# Patient Record
Sex: Male | Born: 1945
Health system: Southern US, Community
[De-identification: ages and names within clinical notes are randomized; demographics above are authoritative.]

## PROBLEM LIST (undated history)

## (undated) DIAGNOSIS — E119 Type 2 diabetes mellitus without complications: Secondary | ICD-10-CM

## (undated) DIAGNOSIS — F419 Anxiety disorder, unspecified: Secondary | ICD-10-CM

## (undated) DIAGNOSIS — E785 Hyperlipidemia, unspecified: Secondary | ICD-10-CM

## (undated) DIAGNOSIS — I10 Essential (primary) hypertension: Secondary | ICD-10-CM

## (undated) DIAGNOSIS — G4730 Sleep apnea, unspecified: Secondary | ICD-10-CM

## (undated) DIAGNOSIS — G4700 Insomnia, unspecified: Secondary | ICD-10-CM

## (undated) DIAGNOSIS — I2510 Atherosclerotic heart disease of native coronary artery without angina pectoris: Secondary | ICD-10-CM

## (undated) DIAGNOSIS — K219 Gastro-esophageal reflux disease without esophagitis: Secondary | ICD-10-CM

## (undated) HISTORY — PX: CARDIAC SURGERY: SHX584

## (undated) HISTORY — DX: Insomnia, unspecified: G47.00

## (undated) HISTORY — PX: CORONARY ARTERY BYPASS GRAFT: SHX141

## (undated) HISTORY — DX: Gastro-esophageal reflux disease without esophagitis: K21.9

## (undated) HISTORY — DX: Sleep apnea, unspecified: G47.30

## (undated) HISTORY — DX: Type 2 diabetes mellitus without complications: E11.9

## (undated) HISTORY — DX: Anxiety disorder, unspecified: F41.9

---

## 2001-06-14 ENCOUNTER — Encounter: Payer: Self-pay | Admitting: Emergency Medicine

## 2001-06-14 ENCOUNTER — Inpatient Hospital Stay (HOSPITAL_COMMUNITY): Admission: EM | Admit: 2001-06-14 | Discharge: 2001-06-17 | Payer: Self-pay | Admitting: Emergency Medicine

## 2001-07-12 ENCOUNTER — Encounter (HOSPITAL_COMMUNITY): Admission: RE | Admit: 2001-07-12 | Discharge: 2001-10-10 | Payer: Self-pay | Admitting: Cardiology

## 2001-10-11 ENCOUNTER — Encounter (HOSPITAL_COMMUNITY): Admission: RE | Admit: 2001-10-11 | Discharge: 2002-01-09 | Payer: Self-pay | Admitting: Cardiology

## 2007-04-09 ENCOUNTER — Inpatient Hospital Stay (HOSPITAL_COMMUNITY): Admission: EM | Admit: 2007-04-09 | Discharge: 2007-04-16 | Payer: Self-pay | Admitting: Emergency Medicine

## 2007-04-09 ENCOUNTER — Ambulatory Visit: Payer: Self-pay | Admitting: *Deleted

## 2007-04-10 ENCOUNTER — Ambulatory Visit: Payer: Self-pay | Admitting: Cardiothoracic Surgery

## 2007-05-09 ENCOUNTER — Ambulatory Visit: Payer: Self-pay | Admitting: Cardiothoracic Surgery

## 2007-05-09 ENCOUNTER — Encounter: Admission: RE | Admit: 2007-05-09 | Discharge: 2007-05-09 | Payer: Self-pay | Admitting: Cardiothoracic Surgery

## 2007-07-15 ENCOUNTER — Ambulatory Visit: Payer: Self-pay | Admitting: Cardiothoracic Surgery

## 2008-08-04 IMAGING — CR DG CHEST 1V PORT
1 series · 1 of 1 positions shown · non-contrast
Comparison: none

CLINICAL DATA: Status post CABG procedure.
 PORTABLE CHEST - 1 VIEW - 04/12/07:

[view not recorded]
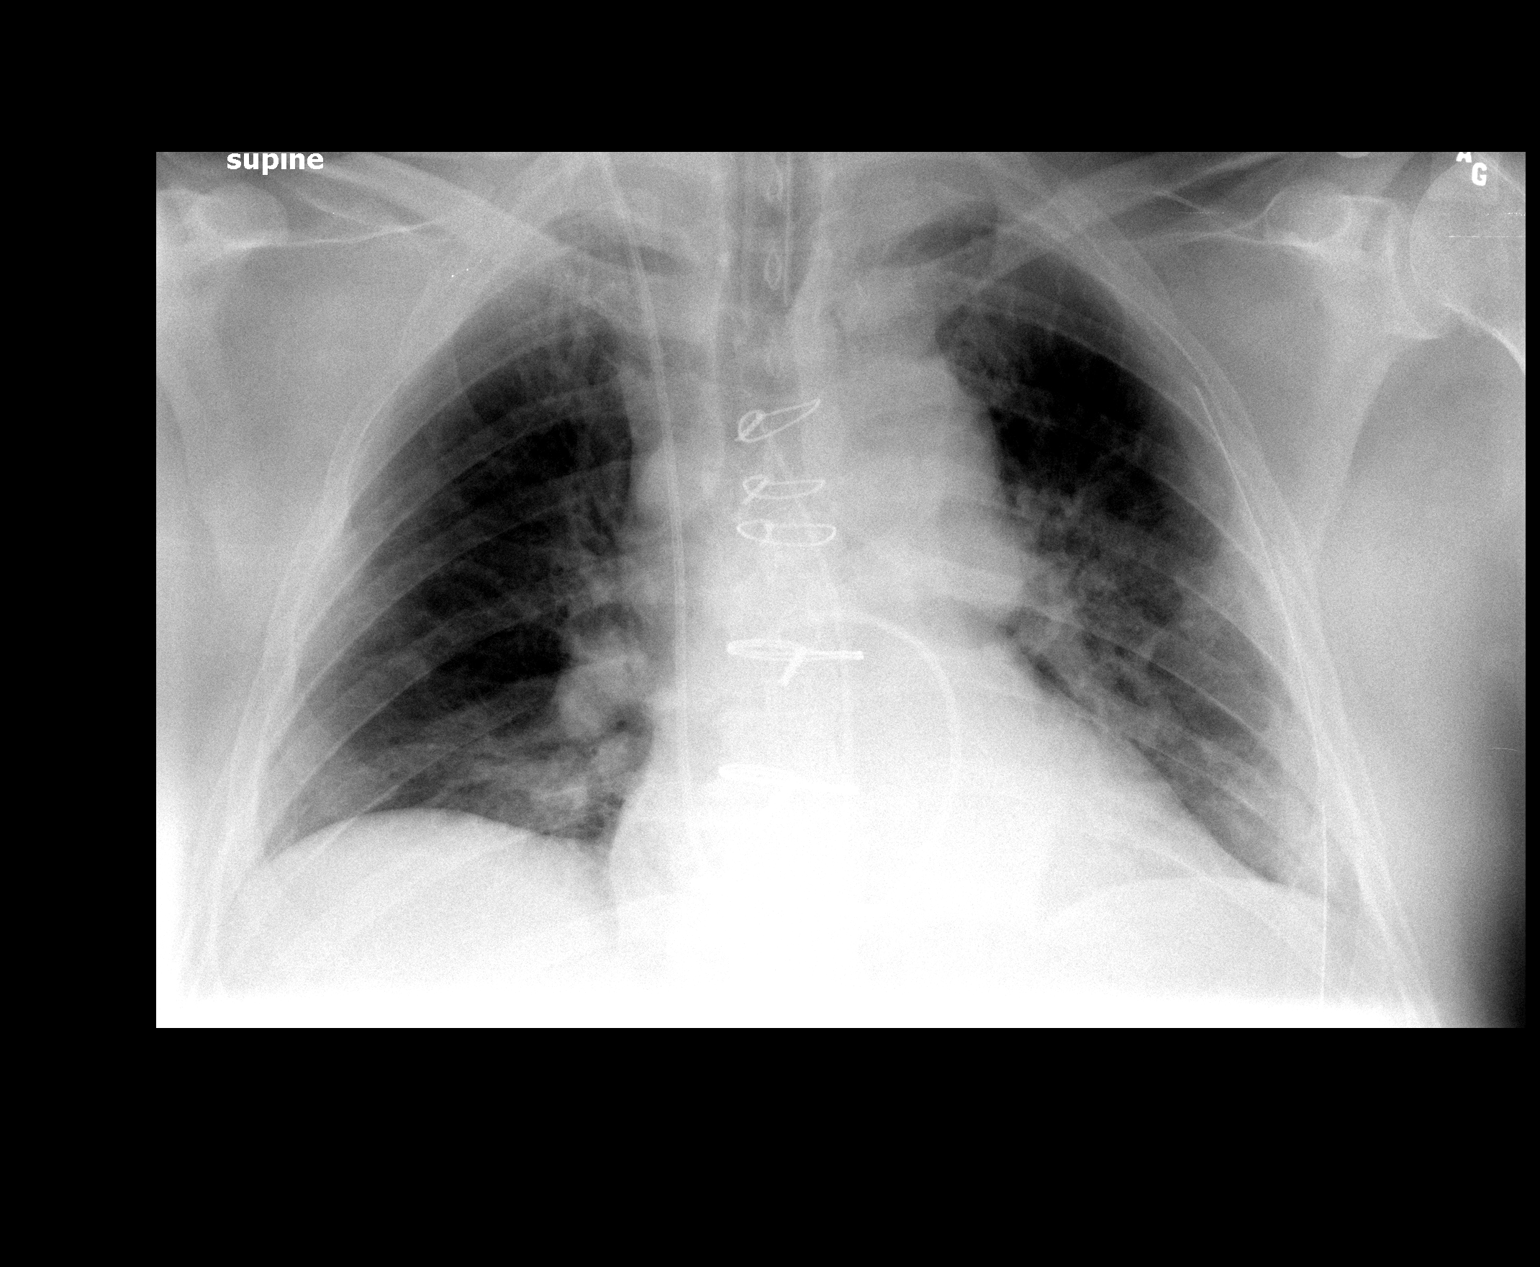

[1 of 1 positions shown; findings below may reference images not displayed]

FINDINGS: An endotracheal tube is 5.8 cm above the carina.  The pulmonary artery catheter is in the main right pulmonary artery. The patient has a left chest tube as well as a mediastinal drain.  There are low lung volumes without a large pneumothorax.  The patient is status median sternotomy.  Fullness in the mediastinum is consistent with recent surgery.
IMPRESSION: 1.  Post-op changes without pneumothorax. 
 2.  Support apparatus as described.

## 2008-08-05 IMAGING — CR DG CHEST 1V PORT
1 series · 1 of 1 positions shown · non-contrast
Comparison: Chest, single view, 04/12/07.

CLINICAL DATA: CABG.  
 PORTABLE CHEST - 1 VIEW ? 04/13/07:

[view not recorded]
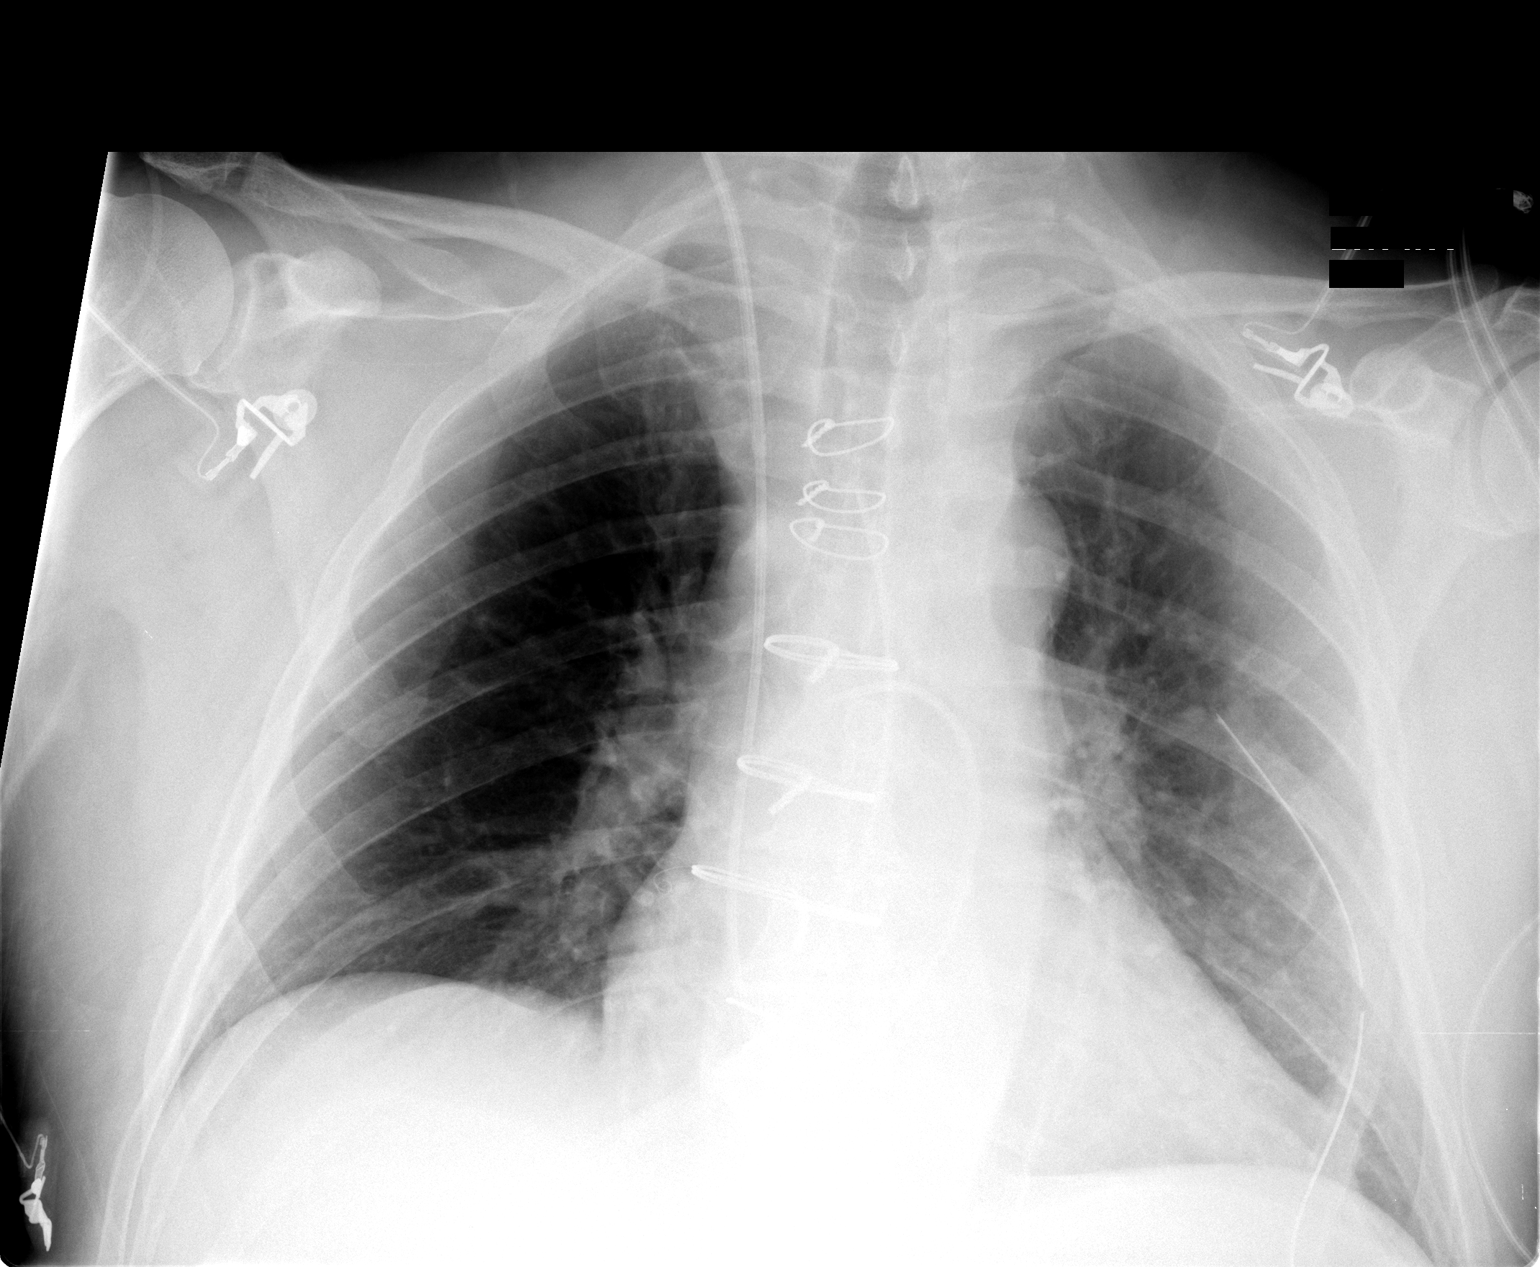

[1 of 1 positions shown; findings below may reference images not displayed]

FINDINGS: Interval removal of endotracheal tube with no significant atelectasis.  Left chest tube remains.  No evidence of pneumothorax.  A Swan-Ganz catheter with tip in the main right pulmonary artery.   There is interval improvement in central venous congestion compared to prior.
IMPRESSION: 1.  Interval extubation without significant atelectasis. 
 2.  Interval improvement in central venous pulmonary congestion.

## 2008-11-08 ENCOUNTER — Emergency Department (HOSPITAL_COMMUNITY): Admission: EM | Admit: 2008-11-08 | Discharge: 2008-11-08 | Payer: Self-pay | Admitting: Emergency Medicine

## 2010-10-18 LAB — WOUND CULTURE

## 2010-10-18 LAB — GLUCOSE, CAPILLARY: Glucose-Capillary: 93 mg/dL (ref 70–99)

## 2010-11-22 NOTE — Assessment & Plan Note (Signed)
OFFICE VISIT   Robert Barrett, Robert Barrett  DOB:  Aug 29, 1945                                        May 09, 2007  CHART #:  [id number]   REFERRING PHYSICIAN:  Georga Hacking, M.D.   BRIEF HISTORY:  The patient is a 65 year old male who underwent coronary  artery bypass grafting times two on April 12, 2007.  He is making  excellent  progress postoperatively.  He has had no recurrent angina.  He is increasing his physical activity appropriately.  He has decided  that he does not wish to go to the cardiac rehab program as he has done  this before and he notes that it slowed him down.   PHYSICAL EXAMINATION:  Vital Signs:  On exam his blood pressure is  138/85, pulse is 83, respiratory rate is 12 and O2 sat is 98%.  Chest:  His sternum is stable and well-healed.  Lungs:  His lungs are clear  bilaterally.  Extremities:  The right thigh endovein harvest site has  healed without difficulty.   DISCUSSION:  Since being seen by Dr. Donnie Aho he has been restarted on his  lisinopril, which was being held because of low blood pressure early  postoperatively.  He continues on simvastatin, Lopressor and aspirin.  Follow up chest x-ray showed clear lung fields bilaterally.   Overall I am very pleased with his progress.  Because his work does  involve heavy lifting and physical activities with his arms, working in  a body shop with a frame machine, he will wait to return to work until  early January.   Sheliah Plane, MD  Electronically Signed   EG/MEDQ  D:  05/09/2007  T:  05/10/2007  Job:  [id number]   cc:   Georga Hacking, M.D.

## 2010-11-22 NOTE — Discharge Summary (Signed)
Robert Barrett, Robert Barrett NO.:  [id number]   MEDICAL RECORD NO.:  [id number]          PATIENT TYPE:  INP   LOCATION:  2027                         FACILITY:  [facility]   PHYSICIAN:  Sheliah Plane, MD    DATE OF BIRTH:  11/21/1945   DATE OF ADMISSION:  04/08/2007  DATE OF DISCHARGE:  04/15/2007                               DISCHARGE SUMMARY   FINAL DIAGNOSIS:  Coronary occlusive disease, unstable angina.   IN-HOSPITAL DIAGNOSIS:  Acute blood loss anemia postoperatively.   SECONDARY DIAGNOSES:  1. Hypertension.  2. Hyperlipidemia.  3. Borderline diabetes, diet controlled.  4. Status post cervical disk surgery 20 years ago without fusion.   IN-HOSPITAL OPERATIONS AND PROCEDURES:  1. Cardiac catheterization.  2. Coronary bypass grafting x2 with left internal mammary artery to      left anterior descending coronary artery, reverse saphenous vein      graft to circumflex coronary artery with right thigh endovein      harvesting.   HISTORY AND PHYSICAL AND HOSPITAL COURSE:  The patient is a 65 year old  male with known coronary occlusive disease having had angioplasty in the  right coronary artery at that time with an acute inferior myocardial  infarction 2002.  The patient has done well until recently when he began  to having rapidly increasing anginal symptoms.  The patient underwent  cardiac catheterization by Dr. Donnie Aho which showed complex high-grade  stenosis of proximal LAD and proximal circumflex.  The right coronary  artery had a patent stent with some luminal irregularities but no high-  grade stenosis.  Because of the anatomic complexity of the lesions,  coronary artery bypass grafting was recommended.  The patient was seen  and evaluated by Dr. Tyrone Sage.  Dr. Tyrone Sage discussed with the patient  undergoing coronary bypass grafting.  He discussed risks and benefits.  The patient acknowledged understanding and agreed to proceed.  Surgery  was scheduled for  April 12, 2007.  Preoperatively, the patient did have  bilateral carotid duplex ultrasound showing no significant ICA stenosis.  The patient remained stable preoperatively.   The patient was taken to the operating room April 12, 2007, where he  underwent coronary bypass grafting x2 with left internal mammary artery  to left anterior descending coronary artery, saphenous vein graft to  circumflex coronary artery with right thigh endovein harvesting.  The  patient tolerated this procedure well and was transferred to the  intensive care unit in stable condition.  Postoperatively, the patient  was noted to be hemodynamically stable.  He was extubated the evening of  surgery.  Post extubation the patient noted to be alert and oriented x4.  Neurologically intact.  The patient's postoperative course was pretty  much unremarkable.  Following the intensive care unit, vital signs were  stable.  He was able to be weaned from all drips.  __________ Normal  fashion.  Postoperative x-ray was stable with minimal drainage from  chest tubes.  Chest tube was discontinued in normal fashion.  The  patient had slight acute blood loss anemia with hematocrit 29%.  He was  asymptomatic and this  was followed.  It did improve by postop day 2 to  33%.  The patient was out of bed ambulating well with cardiac rehab in  the intensive care unit.  He remained in normal sinus rhythm.  The  patient was transferred to 2000 postop day #2.  While on telemetry  floor, the patient remained stable  Vital signs were followed, and the  patient is afebrile prior to discharge.  He was able to be weaned off  oxygen sating greater than 90% on room air.  The patient remained in  normal sinus rhythm.  Pulmonary status remained stable.  Incisions were  clean, dry, intact and healing well.  He continued to ambulate well with  rehab.  Tolerating diet well.  No nausea, vomiting noted.  Postop day #3  showed a white count of 11.6,  hemoglobin of 11.6, hematocrit 33.7,  platelet count 227.  Postop day #2, BMP showed sodium of 137, potassium  4.0, chloride 105, bicarb of 26, BUN of 6, creatinine 0.89, glucose 119.   The patient is tentatively ready for discharge home with the next 24  hours.   FOLLOW-UP APPOINTMENTS:  Follow-up appointment has been arranged with  Dr. Tyrone Sage for May 09, 2007 at 12:30 p.m.  The patient will need  to obtain PMI chest x-ray 30 minutes prior to this appointment.  The  patient will need to follow up with Dr. Donnie Aho in 2 weeks.  He needs to  contact Dr. York Spaniel office to make these arrangements.   DISCHARGE INSTRUCTIONS:  1. Activity: Patient instructed no driving until released to do so.      No lifting over 10 pounds.  He was told to ambulate 3-4 times per      day.  Progress as tolerated.  Continue his breathing exercises.  2. Incisional care.  The patient was told to shower, washing his      incisions using soap and water.  He is to contact the office if he      develops any drainage or opening from any of his incision sites.  3. Diet.  The patient educated on diet to be low-fat, low-salt.   DISCHARGE MEDICATIONS:  1. Aspirin 325 mg daily.  2. Lopressor 25 mg t.i.d.  3. Simvastatin 80 mg at night.  4. Omeprazole 20 mg b.i.d..  5. Oxycodone 5 mg 1-2 tablets q. 4-6 hours p.r.n. pain.      Theda Belfast, [place]      Sheliah Plane, MD  Electronically Signed    KMD/MEDQ  D:  04/15/2007  T:  04/15/2007  Job:  [id number]   cc:   Sheliah Plane, MD  Georga Hacking, M.D.

## 2010-11-22 NOTE — H&P (Signed)
Robert Barrett, Robert Barrett NO.:  [id number]   MEDICAL RECORD NO.:  [id number]          PATIENT TYPE:  EMS   LOCATION:  MAJO                         FACILITY:  [facility]   PHYSICIAN:  Unice Cobble, MD     DATE OF BIRTH:  07/07/1946   DATE OF ADMISSION:  04/09/2007  DATE OF DISCHARGE:                              HISTORY & PHYSICAL   CARDIOLOGIST:  Dr. Karleen [facility]   CHIEF COMPLAINT:  Chest pain.   HISTORY OF PRESENT ILLNESS:  This is a 65 year old white male with a  history of STEMI in 2002, hypertension, hyperlipidemia, presents with  chest pain.  Patient had shortness of breath earlier today around lunch  time which he could not relieve with deep breathing.  He laid down later  in the day and proceeded to have tightness around his upper abdomen  associated with diaphoresis and further shortness of breath.  He did not  have frank substernal chest pain like his inferior STEMI in 2002.  The  pain resolved on its own once he arrived in the emergency room.  No  symptoms of CHF.  No presyncope or palpitations.  The patient admits to  having a very stressful day.   PRIOR MEDICAL HISTORY:  1. STEMI; inferior infarct 2002 treated with primary stenting.  At the      time, he was also shown to have moderate left circumflex and left      anterior descending disease.  Preserved LV function.  2. Hypertension.  3. Hyperlipidemia.  4. Remote smoking history.  5. GERD.  6. Back surgery.   ALLERGIES:  No known drug allergies.   MEDICATIONS:  1. Zocor 20 mg daily.  2. Metoprolol b.i.d., unknown dose.  3. Omeprazole 40 mg daily.  4. Etodolac.  5. Aspirin 81 mg daily.  6. Multivitamin.   SOCIAL HISTORY:  Lives in [place] with his wife.  He does Designer, television/film set  for [employer].  He quit smoking in the mid-1990's.  Social drinker.  No drugs.   FAMILY HISTORY:  His mother died at the age of 74 with cancer.  His  father had a coronary artery bypass grafting  at the age of 38.   REVIEW OF SYSTEMS:  The three review of systems was done and found to be  otherwise negative except as stated in the HPI.   PHYSICAL EXAMINATION:  VITAL SIGNS:  Pulse of 79 with a blood pressure  of 148/104, respiratory rate is 17, he is afebrile, he is 95% on two  liters.  (In the field, his blood pressure was 170/110).  GENERAL:  He is in no acute distress and overweight.  HEENT:  Shows PERRLA, EOMI, MMM.  NECK:  Supple without lymphadenopathy, thyromegaly, bruits or jugular  venous distention.  CARDIOVASCULAR:  His heart has a regular rate rhythm with a normal S1  and S2.  He has a normal PMI.  I can hear no murmurs, gallops or rubs.  Pulses are 2+ and equal bilaterally without bruits.  LUNGS:  Clear to auscultation bilaterally without wheezes, rhonchi or  rales.  ABDOMEN:  Soft and nontender without rebound or guarding.  Normal bowel  sounds.  EXTREMITIES:  No cyanosis, clubbing or edema.  NEUROLOGICALLY:  He is alert and oriented x3.  Cranial nerves II-XII  grossly intact.  Strength is 5/5 all extremities and grip, shows normal  sensation throughout.   Chest x-ray shows no acute cardiopulmonary disease.   CT of the chest is pending at this time.   EKG shows a rate of 79 with normal sinus rhythm without ST or T wave  changes.   Labs show normal CBC and basic metabolic panel with a creatinine of 1.0.  CK-MB and troponin are within the normal limits.   ASSESSMENT/PLAN:  This is a 65 year old white male with a history of  STEMI in 2002, presents with atypical angina.  The patient's symptoms  are concerning in that he has known coronary artery disease and several  symptoms consistent with angina.  He tells me he had a negative stress  test eight months ago which makes the likelihood of disease less likely,  however I will rule him out overnight and plan on either a repeat stress  test in the morning or proceeding with cardiac catheterization per  attending  cardiologist discretion.  I will otherwise continue his home  medications and ensure mobility for deep vein thrombosis prophylaxis.  His wife will bring in his exact mediation dosages tomorrow.  CT read of  the chest will be followed up on shortly.      Unice Cobble, MD  Electronically Signed     ACJ/MEDQ  D:  04/09/2007  T:  04/09/2007  Job:  [id number]

## 2010-11-22 NOTE — Op Note (Signed)
NAMEMAXIMUM, REILAND NO.:  [id number]   MEDICAL RECORD NO.:  [id number]          PATIENT TYPE:  INP   LOCATION:  2313                         FACILITY:  [facility]   PHYSICIAN:  Sheliah Plane, MD    DATE OF BIRTH:  1945/08/17   DATE OF PROCEDURE:  04/12/2007  DATE OF DISCHARGE:                               OPERATIVE REPORT   PREOPERATIVE DIAGNOSIS:  Coronary occlusive disease with unstable  angina.   POSTOPERATIVE DIAGNOSIS:  Coronary occlusive disease with unstable  angina.   SURGICAL PROCEDURE:  Coronary artery bypass grafting times two with left  internal mammary to the left anterior descending coronary artery,  reverse saphenous vein graft to the circumflex coronary artery with  right thigh endovein harvesting.   SURGEON:  Dr. Tyrone Sage.   FIRST ASSISTANT:  Dr. Dorris Fetch.   SECOND ASSISTANT:  Lenise Herald [facility]   BRIEF HISTORY:  Patient a 65 year old male with known coronary occlusive  disease, having had an angioplasty on the right coronary artery at the  time of an acute inferior myocardial infarction in 2002.  Patient has  done well until recently when he began having rapidly increasing anginal  symptoms.  Underwent cardiac catheterization by Dr. Donnie Aho which showed  complex high grade stenosis of the proximal LAD and proximal circumflex.  The right coronary artery had a patent stent with some luminal  irregularities but no high grade stenosis.  Because of the complexity of  the anatomic complexity of the lesions, coronary artery bypass grafting  was recommended to the patient who agreed and signed informed consent.   DESCRIPTION OF PROCEDURE:  With Swan-Ganz and arterial line monitors in  place, the patient underwent general endotracheal anesthesia without  incident.  Skin of the chest and legs was prepped with Betadine and  draped in the usual sterile manner.  Using the [employer] endovein  harvesting system, a segment of an excellent quality vein was  harvested  from the right thigh.  A median sternotomy was performed.  Left internal  mammary artery was dissected down as a pedicle graft and distal artery  was divided, had good free flow.  Pericardium was opened, overall  ventricular function appeared preserved.  Patient was systemically  heparinized.  The ascending aorta and the right atrium were cannulated.  An aortic root bent cardioplegia needle was introduced into the  ascending aorta.  The patient was placed on cardiopulmonary bypass at  2.4L per minute per metered square.  Sites of anastomosis were selected  and dissected out of the epicardium.  The patient's body temperature was  cooled to 34 degrees.  Aortic cross clamp was applied, cold blood  potassium cardioplegia, 500 mL, was administered with diastolic arrest  of the heart, myocardial septal temperature was monitored throughout the  cross clamp.  The heart was elevated.  The circumflex coronary artery,  which was partially intramyocardial, was opened, was of good quality and  with using a running #7-0 Prolene a distal anastomosis was performed.  Additional cold blood cardioplegia was administered down the vein graft.  Attention was then turned to the left anterior descending coronary  artery which was opened in the midportion.  Using a running #8-0  Prolene, the left internal mammary artery was anastomosed to the left  anterior descending coronary artery.  With the crossclamp still in  place, a single punch aortotomy was performed.  The vein graft to the  circumflex was anastomosed to the ascending aorta.  Air was evacuated  from the graft.  Aortic cross clamp was removed.  The total cross clamp  was 41 minutes.  The patient spontaneously converted to a sinus rhythm.  Sites of anastomosis were inspected and free of bleeding.  He was then  ventilated and weaned from cardiopulmonary bypass without difficulty.  Total pump time 55 minutes.  Atrial and ventricular pacing wires  were  applied and a graft marker was applied.  The patient was decannulated in  the usual fashion.  Protamine sulfate was administered.  Pericardium was  loosely reapproximated.  A left pleural tube and a Blake mediastinal  tube were left in place.  Sternum was closed with a #6 stainless steel  wire.  The fascia closed with interrupted #0 Vicryl, running #3-0 Vicryl  in the subcutaneous tissue, #4-0 subcuticular stitch in the skin edges.  Dry dressings were applied.  A sponge and needle count was reported as  correct at the completion of the procedure.  The patient tolerated the  procedure without obvious complication, was transferred to the Surgical  Intensive Care Unit for further postoperative care.      Sheliah Plane, MD  Electronically Signed     EG/MEDQ  D:  04/14/2007  T:  04/14/2007  Job:  [id number]   cc:   Georga Hacking, M.D.

## 2010-11-22 NOTE — Cardiovascular Report (Signed)
NAMEROQUE, SCHILL NO.:  [id number]   MEDICAL RECORD NO.:  [id number]          PATIENT TYPE:  INP   LOCATION:  2039                         FACILITY:  [facility]   PHYSICIAN:  Georga Hacking, M.D.DATE OF BIRTH:  15-Sep-1945   DATE OF PROCEDURE:  04/09/2007  DATE OF DISCHARGE:                            CARDIAC CATHETERIZATION   HISTORY:  A 65 year old male with known coronary artery disease with a  previous inferior infarction that had been previously treated with  direct angioplasty and stenting in 2002.  He presented with worsening  dyspnea acutely that lasted about 30 minutes and then resolved.  He has  residual ST depression and initial cardiac enzymes were unremarkable.   PROCEDURE:  Left heart catheterization with coronary angiograms and left  ventriculogram.   COMMENTS ABOUT THE PROCEDURE:  The right femoral artery was entered  using a single anterior needle wall stick.  A 30 mL ventriculogram was  performed following visualization of the coronary arteries with the  standard right and left four 6-French catheters.  Sheath was removed in  the holding area.   HEMODYNAMIC DATA:  Aorta post contrast 135/88.  LV post contrast 135/6  to 12.   ANGIOGRAPHIC DATA:  Left ventriculogram:  Performed in the 30 degree RAO  projection.  The aortic valve was normal.  The mitral valve was normal.  Left ventricle appears normal in size.  Estimated ejection fraction was  65%.  Coronary arteries arise and distributed normally.  There was mild  coronary artery calcification noted.  Left main coronary artery is  normal.  Left anterior descending has a severe 90% stenosis just after  the first septal perforator.  There are then tandem stenoses of 60% to  70% following 2 areas of aneurysmal dilation involving the mid vessel of  the left anterior descending.  The distal vessel appears suitable for  grafting.  The circumflex coronary artery has a small first marginal  branch.   There is a segmental 60% stenosis followed by an area of  significant aneurysmal dilation out of which comes an area of 90%  stenosis prior to a large marginal branch.  The right coronary artery  has 40% stenosis proximally.  The previous stent site is widely patent  with less than 20% residual narrowing.  Distal vessel is dominant.   IMPRESSION:  1. Significant coronary artery disease with tandem stenosis involving      the proximal and mid left anterior descending and tandem stenosis      involving the circumflex coronary artery and mild-to-moderate      disease involving the right coronary artery.  2. Normal left ventricular function.   RECOMMENDATIONS:  Review of films with interventionalist.  Consider  whether he would be a candidate for complex PCI versus CABG.      Georga Hacking, M.D.  Electronically Signed     WST/MEDQ  D:  04/09/2007  T:  04/09/2007  Job:  [id number]   cc:   [facility]

## 2010-11-22 NOTE — Consult Note (Signed)
Robert Barrett, BOULAY NO.:  [id number]   MEDICAL RECORD NO.:  [id number]          PATIENT TYPE:  INP   LOCATION:  2039                         FACILITY:  [facility]   PHYSICIAN:  Sheliah Plane, MD    DATE OF BIRTH:  03/04/1946   DATE OF CONSULTATION:  04/10/2007  DATE OF DISCHARGE:                                 CONSULTATION   REQUESTING PHYSICIAN:  Georga Hacking, M.D.   FOLLOW-UP CARDIOLOGIST:  Georga Hacking, M.D.   PRIMARY CARE PHYSICIAN:  None.  Seen at the [facility].   REASON FOR CONSULTATION:  Coronary artery disease.   HISTORY OF PRESENT ILLNESS:  The patient is a 65 year old male with  known coronary occlusive disease, having had a ST elevation inferior  myocardial infarction in 2002 with total occlusion of his mid right  coronary artery, treated with a bare metal stent.  At that time he had  moderate circumflex and LAD disease and normal LV function.  He has done  well until the last 3-4 months, when he had been having increasing  shortness of breath with exertion and a vague chest discomfort.  Initially this was relieved by rest; however, on the day of admission on  April 09, 2007, about 3 in the morning while lying down, the patient  developed a sensation of a bear huggin' him and shortness of breath at  rest and came to the emergency room.  The initial troponins were not  elevated.  CK was 105 with an MB of 2.2.  The highest troponin was 0.02.  the patient had inferior ST depression.  The pain subsided and the  patient was admitted and the patient underwent cardiac catheterization  on September 30.  Consideration for stenting was discussed with  cardiology.  A cardiac surgery consultation is now requested.   PREVIOUS CARDIAC HISTORY:  As noted above, the patient had an  angioplasty with bare metal stent for an inferior myocardial infarction  in 2002.  Since that time he has had serial stress tests but no repeat  need for a  catheterization.   CARDIAC RISK FACTORS:  Hypertension, hyperlipidemia, a history of  borderline diabetes, not on any medication at this time.  Positive  family history for cardiac disease.  The patient's father had emergency  bypass surgery approximately 8 years ago following failed acute  angioplasty.  Subsequently he died at the age of 67 with dementia.  The  patient's mother died at the age 53 in 44 with metastatic breast  cancer.  The patient has had no history of previous stroke, no  claudication, no renal insufficiency.   PREVIOUS SURGICAL HISTORY:  Cervical disk surgery approximately 20 years  ago without fusion and he denies any difficulty in neck motion.   SOCIAL HISTORY:  The patient is married, employed doing Games developer  at [employer].  Work does involve heavy lifting.   MEDICATIONS PRIOR TO ADMISSION:  1. Etodolac 400 mg b.i.d.  2. Simvastatin 40 mg p.o. daily.  3. Amlodipine 100 mg a day.  4. Lisinopril 5 mg a day.  5. Omeprazole  20 mg a day.  6. Lopressor 50 mg b.i.d.   DRUG ALLERGIES:  None.   REVIEW OF SYSTEMS:  CARDIAC:  Positive for chest discomfort, resting  shortness of breath, exertional shortness of breath.  Denies orthopnea,  syncope, presyncope, palpitations, and lower extremities.  GENERAL:  The  patient denies fever, chills or night sweats.  He has had no change in  weight.  RESPIRATORY:  No hemoptysis.  Otherwise shortness of breath as  noted above.  GASTROINTESTINAL:  No change in bowel habits.  No blood in  his stool.  NEUROLOGIC:  No TIAs.  MUSCULOSKELETAL:  Does complain of  intermittent low back pain, arthritic pains in both knees and elbows.  GU:  Denies hematuria or urinary frequency.  HEMATOLOGIC:  He denies  easy bruisability.  ENDOCRINE:  Was told in the past that he had  borderline elevated glucose, nonfasting.  A follow-up check was  negative.  Denies psychiatric history.  Denies claudication.  Denies  amaurosis or TIAs.    PHYSICAL EXAM:  Blood pressure 124/83, pulse 78 and regular, respiratory  rate 20, temperature is 97.  O2 saturation is 97% on room air.  Five  feet 11 inches tall, 209 pounds.  The patient appears his stated age of 27 years.  He has no carotid bruits.  LUNGS:  Clear.  CARDIAC:  Regular rate and rhythm without murmur or gallop.  ABDOMEN:  Benign.  There is no palpable abdominal aneurysm appreciated.  He has no organomegaly.  He has bilateral femoral pulses at present.  There is no significant hematoma from the right groin stick yesterday.  The veins in both lower extremities appear adequate for bypass.  He has  +1 DP and PT pulses bilaterally.  The patient is right-handed.   LABORATORY FINDINGS:  White count is 5.9, hematocrit 44.5, platelet  count 247,000.  Creatinine is 1, potassium 4.4, glucose is 138 and 114.  AST is 49, ALT 49, total bilirubin 0.8.  Cardiac catheterization films  are reviewed and discussed with Dr. Donnie Aho.  The patient has sequential  greater than 90% stenoses of the proximal LAD, complicated lesions.  The  circumflex has a 60-70% proximal lesion, some aneurysmal dilatation, and  then a subsequent 80% lesion.  The right coronary artery shows the stent  patent with luminal irregularities throughout the vessel but without  significant disease.   Carotid Doppler studies were performed.  The right Doppler signal was  normal with radial compression, obliterates with ulnar compression.  The  left Doppler signal is normal.  He has no significant carotid artery  stenosis.   IMPRESSION:  Patient with unstable anginal symptoms, negative enzymes,  with severe, complex two-vessel coronary artery disease with some  aneurysmal dilatation of the proximal circumflex and complex left  anterior descending artery lesion with calcium present.  With the  patient's current anatomy, coronary artery bypass grafting is  recommended to the patient and other options including medical  therapy,  which was not advised because of the patient's unstable anginal symptoms  and critical proximal disease in his circumflex and left anterior  descending.  Dr. Elease Hashimoto has reviewed the films and agrees that the best  long-term solution would be bypass surgery.  The risks and options have  been discussed with the patient and his wife and family in detail.  The  risks of the surgery including  death, infection, stroke, myocardial infarction, bleeding with blood  transfusion, were all discussed.  The patient is willing to proceed.  Because of his unstable anginal symptoms that he presented with, he will  stay in the hospital and plan on coronary artery bypass surgery on  October 3.      Sheliah Plane, MD  Electronically Signed     EG/MEDQ  D:  04/10/2007  T:  04/10/2007  Job:  [id number]   cc:   Georga Hacking, M.D.  [facility]

## 2010-11-25 NOTE — Discharge Summary (Signed)
[place]. [facility]  Patient:    Robert Barrett, Robert Barrett Visit Number: [id number] MRN: [id number]          Service Type: MED Location: 2000 2040 01 Attending Physician:  Norman Clay Dictated by:   Darden Palmer., M.D. Admit Date:  06/14/2001 Discharge Date: 06/17/2001   CC:         Melvyn Neth D. Clovis Riley, M.D.   Discharge Summary  FINAL DIAGNOSES: 1. Acute inferior myocardial infarction - initial episode. 2. Treated hypertension. 3. Hyperlipidemia. 4. History of gastroesophageal reflux.  PROCEDURE:  Emergency cardiac catheterization and stenting of the right coronary artery.  HISTORY OF PRESENT ILLNESS:  This 65 year old male has a remote history of cigarettes smoking, hypertension and hyperlipidemia.  He had a two-day history of intermittent exertional substernal chest pain relieved with rest.  He awoke the morning of admission with the onset of crushing substernal chest discomfort suggestive of ischemia and presented to the emergency room where he had an acute inferior wall myocardial infarction.  He was taken to the catheterization laboratory for further intervention.  Please see the previously dictated history and physical for the remainder of the details.  HOSPITAL COURSE:  Lab data on admission showed a random glucose of 156 with a sodium of 141, potassium 3.6 and chloride of 103.  Glucose was 110 the next morning.  CBC and PTT were normal.  The CPK was 165 with an MB of 6.4 on admission.  Troponin was 0.36.  CPK rose following intervention to 1786 with a rapid washout MB was 168.4.  EKG showed an acute inferior infarction.  Chest x-ray was normal.  The patient was taken to the cardiac catheterization laboratory. Catheterization at that time showed mild calcification of the proximal left coronary artery system.  He had aneurysmal dilation with a 60 to 70% stenosis just after the septal perforator. There was a distal 50%  stenosis at the origin of the second diagonal branch.  Circumflex at aneurysmal dilatation with mild to moderate disease.  A small distal side branch had a 99% stenosis. The right coronary artery was a large dominant vessel with a proximal 40% stenosis and the vessel was then totally occluded after an acute marginal branch with collateral filling from the left coronary system.  The patient underwent angioplasty and stenting with a 4.5 x 13 mm Ultrastent with an excellent angiographic result.  He was transferred to the coronary care unit where he had an uncomplicated postoperative course.  He had early washout of CPK-MB.  He had some mild reperfusion arrhythmias at the time of the angioplasty but not significant arrhythmias following this.  Therapy was initiated using ACE inhibitors, beta-blockers, aspirin and Plavix and he received an 18-hour infusion of Integrelin.  He was ambulatory in the hall and received cardiac rehab and felt well and was discharged on June 17, 2001. Instructions were given regarding activity, and he is to remain out of work for approximately one month.  CONDITION ON DISCHARGE:  Improved.  DISCHARGE DIET:  Low fat diet.  DISCHARGE MEDICATIONS: 1. Plavix 75 mg daily. 2. Aspirin 81 mg daily. 3. Zocor 40 mg daily. 4. Altace 10 mg daily. 5. Nexium 40 mg daily. 6. Toprol XL 50 mg daily. 7. Nitroglycerin 1/150 sublingual p.r.n. 8. Lipitor 20 mg daily.  ACTIVITY:  He is to walk daily and is to stay out of work for one month.  ADDITIONAL INSTRUCTIONS:  He is also not to use herbal supplements or products called  Metabolife. Dictated by:   Darden Palmer., M.D. Attending Physician:  Norman Clay DD:  06/17/01 TD:  06/17/01 Job: [id number]

## 2010-11-25 NOTE — H&P (Signed)
[place]. [facility]  Patient:    Robert Barrett, Robert Barrett Visit Number: [id number] MRN: [id number]          Service Type: MED Location: [facility] 2928 01 Attending Physician:  Norman Clay Dictated by:   Darden Palmer., M.D. Admit Date:  06/14/2001   CC:         Abran Cantor. Clovis Riley, M.D.   History and Physical  CHIEF COMPLAINT: Chest pain.  HISTORY OF PRESENT ILLNESS: The patient is a 65 year old male with a remote history of cigarette smoking and a history of hypertension and hyperlipidemia, who presented to the emergency room with an acute inferior infarction.  He described a two day history of intermittent exertional substernal chest pain relieved with rest.  He awoke this morning and had the onset of crushing substernal chest discomfort that was accompanied with one episode of vomiting and begin around 4 a.m.  He was transported to the [facility] Emergency Room, where he was found to have changes of an acute inferior infarction and did not respond to nitroglycerin.  PAST MEDICAL HISTORY:  1. Essential hypertension.  2. Hyperlipidemia.  3. Remote history of smoking.  4. History of reflux, treated with Nexium.  No history of GI bleeding.  PAST SURGICAL HISTORY: Back surgery.  ALLERGIES: None.  CURRENT MEDICATIONS:  1. Lipitor.  2. Nexium.  3. Norvasc.  FAMILY HISTORY: Father is 92 and had bypass grafting at age 38.  Mother died of cancer at age 19.  He is an only child.  No premature family history of heart disease but father does have coronary artery disease.  SOCIAL HISTORY: He has been married for 36 years and has three children.  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  He quit smoking seven to eight years ago.  Drinks alcohol socially.  REVIEW OF SYSTEMS: No recent GI bleeding.  No claudication.  No significant shortness of breath.  Other than as noted above the remainder of the Review  Of Systems is unremarkable.  PHYSICAL EXAMINATION:  GENERAL: He is a bearded male, appearing his staged age.  VITAL SIGNS: Blood pressure 97/70, pulse 80.  SKIN: Warm and dry.  HEENT: EOMI.  PERRLA.  C&S clear.  Pharynx negative.  NECK: Supple without masses.  No JVD, no thyromegaly.  No definite bruits.  LUNGS: Clear.  CARDIAC: Normal S1 and S2.  No S3.  ABDOMEN: Soft, nontender.  No masses, organomegaly, or aneurysm.  EXTREMITIES: Femoral and distal pulses present and 2+.  Peripheral pulses 3+.  NEUROLOGIC: Grossly normal.  LABORATORY DATA: A 12 lead ECG shows changes of an acute inferior infarction with ST depression across the precordial leads, V2 through V4.  Chest x-ray unremarkable.  IMPRESSION:  1. Acute inferior infarction.  2. Hypertension.  3. Hyperlipidemia.  4. Remote cigarette abuse.  5. Hiatal hernia.  PLAN: The patient is admitted at this time and will be taken to the cardiac catheterization laboratory for acute cardiac catheterization and possible angioplasty and stenting for intervention for an acute inferior infarction. The procedure and risks were discussed with the patient and he is agreeable and willing to proceed. Dictated by:   Darden Palmer., M.D. Attending Physician:  Norman Clay DD:  06/14/01 TD:  06/14/01 Job: [id number]

## 2010-11-25 NOTE — Cardiovascular Report (Signed)
[place]. [facility]  Patient:    Robert Barrett, PELLOW Visit Number: [id number] MRN: [id number]          Service Type: MED Location: [facility]UA 2928 01 Attending Physician:  Norman Clay Dictated by:   Darden Palmer., M.D. Proc. Date: 06/14/01 Admit Date:  06/14/2001                          Cardiac Catheterization  PROCEDURES PERFORMED: Cardiac catheterization with stent.  HISTORY: A 65 year old male with hypertension, hyperlipidemia, presented with 2 days of angina, then onset of persistent substernal chest pain and an inferior infarction. Time of presentation to the ER, approximately 2 hours.  COMMENTS ABOUT PROCEDURE: The patient was brought from the emergency room and prepped and draped in the usual manner. After Xylocaine anesthesia, a 7 French sheath was placed in the right femoral artery percutaneously. Angiograms were made using 6 French catheters and a 30 cc ventriculogram was performed. A 6 French venous sheath was placed in the right femoral vein percutaneously. Arrangements were made to do stenting. He previously had been administered heparin and nitroglycerin and aspirin and beta blockers down stairs.  An additional 4000 units of heparin was administered to achieving an ACT of 239 with the final ACT of 259. Integrilin was begun with bolus in constant infusion. Angioplasty equipment used was a 7 [place] JR4 guiding catheter. HTF-J guide wire crossed the lesion easily. The lesion was pre-dilated with a 3.5 x 15 mm CrossSail balloon with moderate reperfusion, arrhythmia and some hypotension responding to 0.5 of atropine and dopamine. A 4.5 x 13 mm Ultra stent was deployed at 12 atmospheres across the stenosis with an excellent angiographic result. A total of 100 mcg of verapamil was administered. The patient had some dull residual chest discomfort and some nausea. ECG improved and resolved significantly. The sheath was sutured in  place and he was returned to the angioplasty care center in stable condition.  HEMODYNAMIC DATA: Aorta post contrast 120/80, LV post contrast 120/25.  ANGIOGRAPHIC DATA:  LEFT VENTRICULOGRAM: The left ventriculogram performed in the 30 degree RAO projection. The aortic valve appears normal. The mitral valve appears normal. There is mild inferior hypokinesis noted. The estimated ejection fraction was 55-60%.  CORONARY ARTERIES: Coronary arteries arise and distribute normally. There is mild calcification noted in the proximal left coronary system.  Left main coronary artery: The left main coronary artery appears normal.  Left anterior descending: The left anterior descending has calcification present proximally.  There is aneurysmal dilatation with a 60-70% stenosis just after the septal perforator and involving the diagonal. There is a distal 50% stenosis at the origin of a second diagonal branch.  Circumflex coronary artery: Aneurysmal dilatation. A small branch has a severe 99% stenosis and is to small to angioplasty or to stent.  Right coronary artery: The right coronary artery is a large dominant vessel. There is a proximal 40% stenosis noted and the vessel is totally occluded just after an acute marginal artery. Collateral filling is seen from the left coronary system.  POST DILATATION ANGIOGRAM: Post dilatation angiograms of the right coronary artery reveal an excellent angiographic result with no residual stenoses noted. There is good distal flow and preservation of all side branches.  IMPRESSION: 1. Successful percutaneous transluminal coronary angioplasty and stenting of    occluded right coronary artery in the setting of an acute infarction    with stenoses going from 100% to  0%. 2. Residual atherosclerotic disease of moderate severity involving the    left anterior descending and circumflex. 3. Preserved left ventricular function. Dictated by:   Darden Palmer., M.D. Attending Physician:  Norman Clay DD:  06/14/01 TD:  06/14/01 Job: [id number]

## 2011-04-20 LAB — POCT I-STAT 3, ART BLOOD GAS (G3+)
Acid-base deficit: 1
Bicarbonate: 23
O2 Saturation: 100
O2 Saturation: 96
O2 Saturation: 97
Patient temperature: 36.2
TCO2: 23
TCO2: 24
TCO2: 26
pCO2 arterial: 33.3 — ABNORMAL LOW
pCO2 arterial: 45.9 — ABNORMAL HIGH
pH, Arterial: 7.303 — ABNORMAL LOW
pO2, Arterial: 86

## 2011-04-20 LAB — CBC
HCT: 29.5 — ABNORMAL LOW
HCT: 32 — ABNORMAL LOW
HCT: 32.4 — ABNORMAL LOW
HCT: 33.4 — ABNORMAL LOW
HCT: 33.7 — ABNORMAL LOW
HCT: 36.2 — ABNORMAL LOW
HCT: 42
HCT: 44.7
Hemoglobin: 10.1 — ABNORMAL LOW
Hemoglobin: 10.9 — ABNORMAL LOW
Hemoglobin: 10.9 — ABNORMAL LOW
Hemoglobin: 11.3 — ABNORMAL LOW
Hemoglobin: 11.6 — ABNORMAL LOW
Hemoglobin: 12.3 — ABNORMAL LOW
Hemoglobin: 14.1
Hemoglobin: 14.3
Hemoglobin: 15.3
MCHC: 33.7
MCHC: 33.8
MCHC: 33.8
MCHC: 34
MCHC: 34.1
MCHC: 34.1
MCHC: 34.3
MCHC: 34.4
MCHC: 34.2
MCHC: 34.3
MCHC: 34.5
MCV: 91.7
MCV: 92.8
MCV: 92.8
MCV: 93
MCV: 93.2
MCV: 93.3
MCV: 93.6
MCV: 94.1
MCV: 91.3
MCV: 91.8
Platelets: 169
Platelets: 174
Platelets: 176
Platelets: 186
Platelets: 227
Platelets: 231
Platelets: 247
Platelets: 247
Platelets: 247
RBC: 3.21 — ABNORMAL LOW
RBC: 3.45 — ABNORMAL LOW
RBC: 3.49 — ABNORMAL LOW
RBC: 3.55 — ABNORMAL LOW
RBC: 3.64 — ABNORMAL LOW
RBC: 4.51
RBC: 4.6
RBC: 4.46
RBC: 4.87
RDW: 11.7
RDW: 11.9
RDW: 12
RDW: 12.3
RDW: 12.3
RDW: 12.4
RDW: 12.4
RDW: 11.9
RDW: 11.9
RDW: 12
RDW: 12.1
WBC: 10.3
WBC: 11.6 — ABNORMAL HIGH
WBC: 13 — ABNORMAL HIGH
WBC: 7.9
WBC: 9.3
WBC: 6.6

## 2011-04-20 LAB — BASIC METABOLIC PANEL
BUN: 6
BUN: 8
CO2: 24
CO2: 26
Calcium: 7.9 — ABNORMAL LOW
Calcium: 8.8
Calcium: 9.7
Chloride: 105
Chloride: 111
Creatinine, Ser: 0.89
Creatinine, Ser: 0.97
GFR calc Af Amer: >60
GFR calc Af Amer: >60
GFR calc Af Amer: >60
GFR calc Af Amer: >60
GFR calc non Af Amer: >60
GFR calc non Af Amer: >60
GFR calc non Af Amer: >60
GFR calc non Af Amer: >60
Glucose, Bld: 119 — ABNORMAL HIGH
Glucose, Bld: 94
Potassium: 3.9
Potassium: 4
Potassium: 4.4
Sodium: 137
Sodium: 138
Sodium: 139
Sodium: 143

## 2011-04-20 LAB — POCT CARDIAC MARKERS
Myoglobin, poc: 110
Operator id: 270111
Operator id: 270111
Troponin i, poc: <0.05

## 2011-04-20 LAB — APTT
aPTT: 32
aPTT: 82 — ABNORMAL HIGH
aPTT: 26
aPTT: 26
aPTT: 27

## 2011-04-20 LAB — CK TOTAL AND CKMB (NOT AT ARMC)
CK, MB: 5.2 — ABNORMAL HIGH
CK, MB: 2.2
Relative Index: 2.2
Relative Index: 2.1
Total CK: 236 — ABNORMAL HIGH
Total CK: 105

## 2011-04-20 LAB — PROTIME-INR
INR: 1.2
INR: 0.9
INR: 1
Prothrombin Time: 12.7
Prothrombin Time: 15.6 — ABNORMAL HIGH
Prothrombin Time: 12.6
Prothrombin Time: 12.9

## 2011-04-20 LAB — COMPREHENSIVE METABOLIC PANEL WITH GFR
ALT: 49
AST: 49 — ABNORMAL HIGH
Albumin: 3.9
Alkaline Phosphatase: 72
BUN: 8
CO2: 23
Calcium: 9.6
Chloride: 104
Creatinine, Ser: 0.86
GFR calc non Af Amer: >60
Glucose, Bld: 138 — ABNORMAL HIGH
Potassium: 3.8
Sodium: 138
Total Bilirubin: 0.8
Total Protein: 7.1

## 2011-04-20 LAB — CARDIAC PANEL(CRET KIN+CKTOT+MB+TROPI)
CK, MB: 1.6
CK, MB: 2
Relative Index: INVALID
Relative Index: INVALID
Total CK: 75
Total CK: 92
Troponin I: 0.02

## 2011-04-20 LAB — BLOOD GAS, ARTERIAL
Acid-base deficit: 1.5
Bicarbonate: 22.2
FIO2: 0.21
O2 Saturation: 96.3
Patient temperature: 98.6
TCO2: 23.2
pCO2 arterial: 33.6 — ABNORMAL LOW
pH, Arterial: 7.434
pO2, Arterial: 80.4

## 2011-04-20 LAB — LIPID PANEL
HDL: 25 — ABNORMAL LOW
Total CHOL/HDL Ratio: 5.6

## 2011-04-20 LAB — DIFFERENTIAL
Basophils Absolute: 0
Basophils Relative: 1
Eosinophils Absolute: 0.1
Monocytes Absolute: 0.6
Monocytes Relative: 13 — ABNORMAL HIGH
Neutro Abs: 2.9

## 2011-04-20 LAB — URINALYSIS, ROUTINE W REFLEX MICROSCOPIC
Bilirubin Urine: NEGATIVE
Glucose, UA: NEGATIVE
Hgb urine dipstick: NEGATIVE
Ketones, ur: NEGATIVE
Nitrite: NEGATIVE
Protein, ur: NEGATIVE
Specific Gravity, Urine: 1.017
Urobilinogen, UA: 1
pH: 6.5

## 2011-04-20 LAB — I-STAT EC8
BUN: 10
Bicarbonate: 21.5
Chloride: 105
HCT: 32 — ABNORMAL LOW
Hemoglobin: 10.9 — ABNORMAL LOW
Operator id: 148471
Potassium: 4.1
Sodium: 138

## 2011-04-20 LAB — HEPARIN LEVEL (UNFRACTIONATED)
Heparin Unfractionated: 0.28 — ABNORMAL LOW
Heparin Unfractionated: 0.46

## 2011-04-20 LAB — POCT I-STAT 4, (NA,K, GLUC, HGB,HCT)
Glucose, Bld: 131 — ABNORMAL HIGH
Glucose, Bld: 133 — ABNORMAL HIGH
HCT: 29 — ABNORMAL LOW
HCT: 36 — ABNORMAL LOW
Hemoglobin: 12.2 — ABNORMAL LOW
Hemoglobin: 9.2 — ABNORMAL LOW
Operator id: 182771
Operator id: 3402
Operator id: 3402
Operator id: 3402
Potassium: 4.5
Potassium: 4.5
Sodium: 133 — ABNORMAL LOW
Sodium: 135
Sodium: 139

## 2011-04-20 LAB — I-STAT 8, (EC8 V) (CONVERTED LAB)
BUN: 11
Bicarbonate: 23.9
Bicarbonate: 25.5 — ABNORMAL HIGH
Glucose, Bld: 124 — ABNORMAL HIGH
Glucose, Bld: 124 — ABNORMAL HIGH
Hemoglobin: 12.9 — ABNORMAL LOW
Operator id: 270111
Sodium: 138
TCO2: 25
pCO2, Ven: 40.5 — ABNORMAL LOW
pCO2, Ven: 43.9 — ABNORMAL LOW
pH, Ven: 7.379 — ABNORMAL HIGH

## 2011-04-20 LAB — BASIC METABOLIC PANEL WITH GFR
BUN: 7
CO2: 26
Calcium: 9.6
Chloride: 105
Creatinine, Ser: 0.95
GFR calc non Af Amer: >60
Glucose, Bld: 114 — ABNORMAL HIGH
Potassium: 4.3
Sodium: 139

## 2011-04-20 LAB — ABO/RH: ABO/RH(D): O NEG

## 2011-04-20 LAB — HEMOGLOBIN AND HEMATOCRIT, BLOOD: HCT: 31.1 — ABNORMAL LOW

## 2011-04-20 LAB — PLATELET COUNT: Platelets: 255

## 2011-04-20 LAB — TYPE AND SCREEN
ABO/RH(D): O NEG
Antibody Screen: NEGATIVE

## 2011-04-20 LAB — HEMOGLOBIN A1C
Hgb A1c MFr Bld: 6
Hgb A1c MFr Bld: 6.2 — ABNORMAL HIGH
Mean Plasma Glucose: 136

## 2011-04-20 LAB — CREATININE, SERUM
Creatinine, Ser: 0.9
GFR calc Af Amer: >60
GFR calc non Af Amer: >60

## 2011-04-20 LAB — MAGNESIUM
Magnesium: 2.2
Magnesium: 2.5

## 2011-04-20 LAB — BLEEDING TIME: Bleeding Time: 4

## 2013-06-09 ENCOUNTER — Telehealth: Payer: Self-pay

## 2013-06-09 NOTE — Telephone Encounter (Signed)
Called patient to see if he has had his LDL checked this year.  He said he has and that he is followed by the [place].  He could not give exact result but said his levels were good.

## 2013-10-10 ENCOUNTER — Ambulatory Visit (HOSPITAL_BASED_OUTPATIENT_CLINIC_OR_DEPARTMENT_OTHER): Payer: Medicare Other | Attending: Cardiology | Admitting: Radiology

## 2013-10-10 VITALS — Ht 70.0 in | Wt 220.0 lb

## 2013-10-10 DIAGNOSIS — G4733 Obstructive sleep apnea (adult) (pediatric): Secondary | ICD-10-CM | POA: Insufficient documentation

## 2013-10-18 DIAGNOSIS — G4730 Sleep apnea, unspecified: Secondary | ICD-10-CM

## 2013-10-18 DIAGNOSIS — G4733 Obstructive sleep apnea (adult) (pediatric): Secondary | ICD-10-CM

## 2013-10-18 DIAGNOSIS — G4710 Hypersomnia, unspecified: Secondary | ICD-10-CM

## 2013-10-18 NOTE — Sleep Study (Signed)
   NAME: Robert Barrett DATE OF BIRTH:  1946/03/03 MEDICAL RECORD NUMBER [id number]  LOCATION: [facility]  PHYSICIAN: Marlita Keil D Brixton Schnapp  DATE OF STUDY: 10/10/2013  SLEEP STUDY TYPE: Nocturnal Polysomnogram               REFERRING PHYSICIAN: Othella Boyerilley, William S, MD  INDICATION FOR STUDY: Hypersomnia with sleep apnea  EPWORTH SLEEPINESS SCORE:   3/24 HEIGHT: 5\' 10"  (177.8 cm)  WEIGHT: 220 lb (99.791 kg)    Body mass index is 31.57 kg/(m^2).  NECK SIZE: 16.5 in.  MEDICATIONS: Charted for review  SLEEP ARCHITECTURE: Split study protocol. During the diagnostic phase, total sleep time 120 minutes with sleep efficiency 87.3%. Stage I was 5.4%, stage II 70.4%, stage III absent, REM 24.2% of total sleep time. Sleep latency 14.5 minutes, REM latency 52 minutes, awake after sleep onset 3.5 minutes, arousal index 29, bedtime medication: Aspirin, rosuvastatin, temazepam, omeprazole, lisinopril, etodolac, amlodipine  RESPIRATORY DATA: Apnea hypopnea index (AHI) 58.5 per hour. 117 total events scored including 66 obstructive apneas, 2 central apneas, 1 mixed apnea, 48 hypopneas. Most events were associated with supine sleep position. REM AHI 22.8 per hour. CPAP was titrated to 8 CWP, AHI 0.6 per hour. He wore a large ResMed air fit F. 10 fullface mask with heated humidifier.  OXYGEN DATA: Moderate snoring before CPAP with oxygen desaturation to a nadir of 77% on room air. With CPAP control, snoring was prevented and mean oxygen saturation held 92.7% on room air.  CARDIAC DATA: Normal sinus rhythm  MOVEMENT/PARASOMNIA: No significant movement disturbance, no bathroom trips  IMPRESSION/ RECOMMENDATION:   1) Severe obstructive sleep apnea/hypopneas syndrome, AHI 58.5 per hour with mostly supine events. REM AHI 22.8 per hour. Moderate snoring with oxygen desaturation to a nadir of 77% on room air. 2) Successful CPAP titration to 8 CWP, AHI 0.6 per hour. He wore a large ResMed air fit  F. 10 fullface mask with heated humidifier. Snoring was prevented and mean oxygen saturation held 92.7% on room air.   Signed Jetty Duhamellinton Ganon Demasi M.D. Waymon Budgelinton D Elvis Boot Diplomate, Biomedical engineerAmerican Board of Sleep Medicine  ELECTRONICALLY SIGNED ON:  10/18/2013, 11:06 AM [facility] PH: [phone]   FX: [phone] ACCREDITED BY THE AMERICAN ACADEMY OF SLEEP MEDICINE

## 2013-10-27 ENCOUNTER — Emergency Department (HOSPITAL_COMMUNITY)
Admission: EM | Admit: 2013-10-27 | Discharge: 2013-10-27 | Disposition: A | Discharge status: Home / Self Care | Payer: Medicare Other | Source: Home / Self Care | Attending: Family Medicine | Admitting: Family Medicine

## 2013-10-27 ENCOUNTER — Encounter (HOSPITAL_COMMUNITY): Payer: Self-pay | Admitting: Emergency Medicine

## 2013-10-27 DIAGNOSIS — S91339A Puncture wound without foreign body, unspecified foot, initial encounter: Secondary | ICD-10-CM

## 2013-10-27 DIAGNOSIS — Y93G1 Activity, food preparation and clean up: Secondary | ICD-10-CM

## 2013-10-27 DIAGNOSIS — W260XXA Contact with knife, initial encounter: Secondary | ICD-10-CM

## 2013-10-27 DIAGNOSIS — S91309A Unspecified open wound, unspecified foot, initial encounter: Secondary | ICD-10-CM

## 2013-10-27 DIAGNOSIS — W261XXA Contact with sword or dagger, initial encounter: Secondary | ICD-10-CM

## 2013-10-27 DIAGNOSIS — Z23 Encounter for immunization: Secondary | ICD-10-CM

## 2013-10-27 HISTORY — DX: Essential (primary) hypertension: I10

## 2013-10-27 MED ORDER — TETANUS-DIPHTH-ACELL PERTUSSIS 5-2.5-18.5 LF-MCG/0.5 IM SUSP
0.5000 mL | Freq: Once | INTRAMUSCULAR | Status: AC
  Administered 2013-10-27: 0.5 mL via INTRAMUSCULAR

## 2013-10-27 MED ORDER — CEFUROXIME AXETIL 500 MG PO TABS: 500.0000 mg | ORAL_TABLET | Freq: Two times a day (BID) | ORAL | Status: DC

## 2013-10-27 MED ORDER — CEFUROXIME AXETIL 250 MG PO TABS: 250.0000 mg | ORAL_TABLET | Freq: Two times a day (BID) | ORAL | Status: DC

## 2013-10-27 MED ORDER — BACITRACIN 500 UNIT/GM EX OINT
1.0000 "application " | TOPICAL_OINTMENT | Freq: Once | CUTANEOUS | Status: AC
  Administered 2013-10-27: 1 via TOPICAL

## 2013-10-27 MED ORDER — TETANUS-DIPHTH-ACELL PERTUSSIS 5-2.5-18.5 LF-MCG/0.5 IM SUSP
INTRAMUSCULAR | Status: AC
  Filled 2013-10-27: qty 0.5

## 2013-10-27 NOTE — ED Provider Notes (Signed)
CSN: [id number]     Arrival date & time 10/27/13  [id number] History   First MD Initiated Contact with Patient 10/27/13 [phone]     Chief Complaint  Patient presents with  . Extremity Laceration   (Consider location/radiation/quality/duration/timing/severity/associated sxs/prior Treatment) HPI Comments: 68 year old male presents complaining of puncture wound to his left instep of his foot. He was cleaning fish when he dropped a dirty knife to the ground and then stabbed into the side of his foot. This happened yesterday at noon, approximately 21 hours ago. Immediately after this happened, he cleaned the wound under running water, with alcohol, and proximal to it. He continues to have some bleeding whenever he tries to walk on it. Denies any injuries or any other symptoms. He takes one 81 mg aspirin daily, no other blood thinners.   History reviewed. No pertinent past medical history. History reviewed. No pertinent past surgical history. No family history on file. History  Substance Use Topics  . Smoking status: Never Smoker   . Smokeless tobacco: Not on file  . Alcohol Use: Yes    Review of Systems  Skin: Positive for wound.  All other systems reviewed and are negative.   Allergies  Review of patient's allergies indicates no known allergies.  Home Medications   Prior to Admission medications   Medication Sig Start Date End Date Taking? Authorizing Provider  cefUROXime (CEFTIN) 250 MG tablet Take 1 tablet (250 mg total) by mouth 2 (two) times daily with a meal. 10/27/13   Adrian BlackwaterZachary H Lawrance Wiedemann, PA-C   BP 150/93  Pulse 73  Temp(Src) 98.5 F (36.9 C) (Oral)  Resp 16  SpO2 98% Physical Exam  Nursing note and vitals reviewed. Constitutional: He is oriented to person, place, and time. He appears well-developed and well-nourished. No distress.  HENT:  Head: Normocephalic.  Pulmonary/Chest: Effort normal. No respiratory distress.  Musculoskeletal:       Left foot: He exhibits laceration.     Feet:  Neurological: He is alert and oriented to person, place, and time. Coordination normal.  Skin: Skin is warm and dry. No rash noted. He is not diaphoretic.  Psychiatric: He has a normal mood and affect. Judgment normal.    ED Course  Procedures (including critical care time) Labs Review Labs Reviewed - No data to display   Imaging Review No results found.   MDM   1. Puncture wound of foot    History late to suture the wound, I will place a few Steri-Strips and he is advised to stay off of this for a day to allow to heal. Tetanus is not up to date, TDaP given. Will give antibiotics for wound infection prophylaxis for a dirty puncture wound. F/U if any signs of infection, reviewed with pt.     New Prescriptions   CEFUROXIME (CEFTIN) 250 MG TABLET    Take 1 tablet (250 mg total) by mouth 2 (two) times daily with a meal.       Graylon GoodZachary H Josseline Reddin, PA-C 10/27/13 [phone]
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
## 2013-10-29 NOTE — ED Provider Notes (Signed)
Medical screening examination/treatment/procedure(s) were performed by a resident physician or non-physician practitioner and as the supervising physician I was immediately available for consultation/collaboration.  Paulino Cork, MD    Linard Daft S Ventura Leggitt, MD 10/29/13 1323 

## 2014-08-07 DIAGNOSIS — G4733 Obstructive sleep apnea (adult) (pediatric): Secondary | ICD-10-CM | POA: Diagnosis not present

## 2014-08-07 DIAGNOSIS — E668 Other obesity: Secondary | ICD-10-CM | POA: Diagnosis not present

## 2014-08-07 DIAGNOSIS — I252 Old myocardial infarction: Secondary | ICD-10-CM | POA: Diagnosis not present

## 2014-08-07 DIAGNOSIS — I10 Essential (primary) hypertension: Secondary | ICD-10-CM | POA: Diagnosis not present

## 2014-08-07 DIAGNOSIS — I2510 Atherosclerotic heart disease of native coronary artery without angina pectoris: Secondary | ICD-10-CM | POA: Diagnosis not present

## 2014-08-07 DIAGNOSIS — E785 Hyperlipidemia, unspecified: Secondary | ICD-10-CM | POA: Diagnosis not present

## 2014-09-24 ENCOUNTER — Encounter (HOSPITAL_COMMUNITY): Payer: Self-pay | Admitting: Family Medicine

## 2014-09-24 ENCOUNTER — Emergency Department (HOSPITAL_COMMUNITY)
Admission: EM | Admit: 2014-09-24 | Discharge: 2014-09-24 | Discharge status: Absent without leave | Payer: Self-pay | Source: Home / Self Care

## 2014-09-24 ENCOUNTER — Emergency Department (HOSPITAL_COMMUNITY)
Admission: EM | Admit: 2014-09-24 | Discharge: 2014-09-24 | Disposition: A | Discharge status: Home / Self Care | Payer: Medicare Other | Source: Home / Self Care

## 2014-09-24 DIAGNOSIS — H6092 Unspecified otitis externa, left ear: Secondary | ICD-10-CM | POA: Diagnosis not present

## 2014-09-24 DIAGNOSIS — J324 Chronic pansinusitis: Secondary | ICD-10-CM | POA: Diagnosis not present

## 2014-09-24 DIAGNOSIS — H7292 Unspecified perforation of tympanic membrane, left ear: Secondary | ICD-10-CM

## 2014-09-24 HISTORY — DX: Atherosclerotic heart disease of native coronary artery without angina pectoris: I25.10

## 2014-09-24 HISTORY — DX: Hyperlipidemia, unspecified: E78.5

## 2014-09-24 MED ORDER — FLUTICASONE PROPIONATE 50 MCG/ACT NA SUSP: 2.0000 | Freq: Every day | NASAL | Status: DC

## 2014-09-24 MED ORDER — IPRATROPIUM BROMIDE 0.06 % NA SOLN: 2.0000 | Freq: Four times a day (QID) | NASAL | Status: DC

## 2014-09-24 MED ORDER — AMOXICILLIN-POT CLAVULANATE 875-125 MG PO TABS: 1.0000 | ORAL_TABLET | Freq: Two times a day (BID) | ORAL | Status: DC

## 2014-09-24 MED ORDER — NEOMYCIN-POLYMYXIN-HC 3.5-10000-1 OT SOLN: OTIC | Status: DC

## 2014-09-24 NOTE — ED Notes (Signed)
Head and ear problems for 5 days.  Has used ear drops and mucinex with little improvement.

## 2014-09-24 NOTE — Discharge Instructions (Signed)
You haven't developed a sinus infection that likely was viral but has now become bacterial. Please start the Augmentin. Please also use the eardrops for your ear infection. Please consider following up with an ENT doctor for your perforated eardrum. Please use the nasal Atrovent to help with your runny nose and the Flonase to improve your nasal congestion. Please use ibuprofen every 6 hours for additional pain and inflammation relief. You can also consider taking a daily allergy pill for your symptoms such as Zyrtec

## 2014-09-24 NOTE — ED Provider Notes (Signed)
CSN: [id number]     Arrival date & time 09/24/14  1459 History   None    Chief Complaint  Patient presents with  . Headache  . Otalgia   (Consider location/radiation/quality/duration/timing/severity/associated sxs/prior Treatment) HPI   7 days ago developed cough and congestion and weakness. Slept through the weekend. Initially improved but now worsening. L ear full feeling and discharge and worsening deafness. Copious rinorrhea. symtpoms are constant but wax and wane. mucinex w/ improvement. Left ear pain is constant and getting worse. Patient tried swimmer's ear drops without relief. Denies chest pain, shortness of breath, palpitations, nausea, vomiting, diarrhea, rash, syncope. Endorses intermittent headache.  Past Medical History  Diagnosis Date  . Hypertension   . HLD (hyperlipidemia)   . CAD (coronary artery disease)    Past Surgical History  Procedure Laterality Date  . Cardiac surgery    . Coronary artery bypass graft     Family History  Problem Relation Age of Onset  . Cancer Mother     breast  . Heart disease Father    History  Substance Use Topics  . Smoking status: Never Smoker   . Smokeless tobacco: Not on file  . Alcohol Use: Yes    Review of Systems Per HPI with all other pertinent systems negative.   Allergies  Review of patient's allergies indicates no known allergies.  Home Medications   Prior to Admission medications   Medication Sig Start Date End Date Taking? Authorizing Provider  amLODipine (NORVASC) 10 MG tablet Take 10 mg by mouth daily.    Historical Provider, MD  amoxicillin-clavulanate (AUGMENTIN) 875-125 MG per tablet Take 1 tablet by mouth 2 (two) times daily. 09/24/14   Ozella Rocksavid J Merrell, MD  aspirin 325 MG EC tablet Take 325 mg by mouth daily.    Historical Provider, MD  cefUROXime (CEFTIN) 250 MG tablet Take 1 tablet (250 mg total) by mouth 2 (two) times daily with a meal. 10/27/13   Graylon GoodZachary H Baker, PA-C  clonazePAM (KLONOPIN) 0.5 MG  tablet Take 0.5 mg by mouth 2 (two) times daily as needed for anxiety.    Historical Provider, MD  etodolac (LODINE) 400 MG tablet Take 400 mg by mouth 2 (two) times daily.    Historical Provider, MD  fenofibrate (TRIGLIDE) 50 MG tablet Take 50 mg by mouth daily.    Historical Provider, MD  fluticasone (FLONASE) 50 MCG/ACT nasal spray Place 2 sprays into both nostrils at bedtime. 09/24/14   Ozella Rocksavid J Merrell, MD  gemfibrozil (LOPID) 600 MG tablet Take 600 mg by mouth 2 (two) times daily before a meal.    Historical Provider, MD  HYDROcodone-acetaminophen (NORCO/VICODIN) 5-325 MG per tablet Take 1 tablet by mouth every 6 (six) hours as needed for moderate pain.    Historical Provider, MD  ipratropium (ATROVENT) 0.06 % nasal spray Place 2 sprays into both nostrils 4 (four) times daily. 09/24/14   Ozella Rocksavid J Merrell, MD  lisinopril (PRINIVIL,ZESTRIL) 40 MG tablet Take 40 mg by mouth daily.    Historical Provider, MD  metoprolol (LOPRESSOR) 50 MG tablet Take 50 mg by mouth 2 (two) times daily.    Historical Provider, MD  neomycin-polymyxin-hydrocortisone (CORTISPORIN) otic solution 3-4 Drops, 4 times per day for 7 days 09/24/14   Ozella Rocksavid J Merrell, MD  omeprazole (PRILOSEC) 20 MG capsule Take 20 mg by mouth daily.    Historical Provider, MD  rosuvastatin (CRESTOR) 10 MG tablet Take 10 mg by mouth daily.    Historical Provider, MD  sildenafil (  VIAGRA) 100 MG tablet Take 100 mg by mouth daily as needed for erectile dysfunction.    Historical Provider, MD  temazepam (RESTORIL) 30 MG capsule Take 30 mg by mouth at bedtime as needed for sleep.    Historical Provider, MD   BP 125/78 mmHg  Pulse 80  Temp(Src) 97.9 F (36.6 C) (Oral)  Resp 16  SpO2 95% Physical Exam  Physical Exam  Constitutional: oriented to person, place, and time. appears well-developed and well-nourished. No distress.  HENT:  Right external ear canal edematous with tympanic membrane injection without effusion. Left external ear canal  markedly edematous with narrow caliber canal with skin maceration and very tender on speculum insertion. Left tympanic membrane with small perforation at approximately the 7 to 8:00 position with purulent discharge. Head: Normocephalic and atraumatic.  Eyes: EOMI. PERRL.  Neck: Normal range of motion.  Cardiovascular: RRR, no m/r/g, 2+ distal pulses,  Pulmonary/Chest: Effort normal and breath sounds normal. No respiratory distress.  Abdominal: Soft. Bowel sounds are normal. NonTTP, no distension.  Musculoskeletal: Normal range of motion. Non ttp, no effusion.  Neurological: alert and oriented to person, place, and time.  Skin: Skin is warm. No rash noted. non diaphoretic.  Psychiatric: normal mood and affect. behavior is normal. Judgment and thought content normal.    ED Course  Procedures (including critical care time) Labs Review Labs Reviewed - No data to display  Imaging Review No results found.   MDM   1. Perforated ear drum, left   2. Otitis externa, left   3. Pansinusitis, unspecified chronicity    Start Cortisporin and Augmentin. Nasal Atrovent, Flonase, Zyrtec. NSAIDs when necessary relief. Fluids, rest  Precautions given and all questions answered  Shelly Flatten, MD Family Medicine 09/24/2014, 3:58 PM      Ozella Rocks, MD 09/24/14 [phone]

## 2015-08-17 DIAGNOSIS — R0789 Other chest pain: Secondary | ICD-10-CM | POA: Diagnosis not present

## 2015-08-17 DIAGNOSIS — G4733 Obstructive sleep apnea (adult) (pediatric): Secondary | ICD-10-CM | POA: Diagnosis not present

## 2015-08-17 DIAGNOSIS — I2510 Atherosclerotic heart disease of native coronary artery without angina pectoris: Secondary | ICD-10-CM | POA: Diagnosis not present

## 2015-08-27 DIAGNOSIS — I2510 Atherosclerotic heart disease of native coronary artery without angina pectoris: Secondary | ICD-10-CM | POA: Diagnosis not present

## 2015-08-27 DIAGNOSIS — R0789 Other chest pain: Secondary | ICD-10-CM | POA: Diagnosis not present

## 2015-08-31 DIAGNOSIS — I2510 Atherosclerotic heart disease of native coronary artery without angina pectoris: Secondary | ICD-10-CM | POA: Diagnosis not present

## 2015-08-31 DIAGNOSIS — I252 Old myocardial infarction: Secondary | ICD-10-CM | POA: Diagnosis not present

## 2015-08-31 DIAGNOSIS — I10 Essential (primary) hypertension: Secondary | ICD-10-CM | POA: Diagnosis not present

## 2016-02-18 DIAGNOSIS — I10 Essential (primary) hypertension: Secondary | ICD-10-CM | POA: Diagnosis not present

## 2016-02-18 DIAGNOSIS — E785 Hyperlipidemia, unspecified: Secondary | ICD-10-CM | POA: Diagnosis not present

## 2016-02-18 DIAGNOSIS — I2510 Atherosclerotic heart disease of native coronary artery without angina pectoris: Secondary | ICD-10-CM | POA: Diagnosis not present

## 2016-04-03 ENCOUNTER — Encounter (HOSPITAL_COMMUNITY): Payer: Self-pay | Admitting: Emergency Medicine

## 2016-04-03 ENCOUNTER — Ambulatory Visit (HOSPITAL_COMMUNITY)
Admission: EM | Admit: 2016-04-03 | Discharge: 2016-04-03 | Disposition: A | Discharge status: Home / Self Care | Payer: Medicare Other | Attending: Physician Assistant | Admitting: Physician Assistant

## 2016-04-03 DIAGNOSIS — H9201 Otalgia, right ear: Secondary | ICD-10-CM | POA: Diagnosis not present

## 2016-04-03 DIAGNOSIS — H6121 Impacted cerumen, right ear: Secondary | ICD-10-CM

## 2016-04-03 MED ORDER — CEPHALEXIN 250 MG PO CAPS: 250.0000 mg | ORAL_CAPSULE | Freq: Three times a day (TID) | ORAL | 0 refills | Status: DC

## 2016-04-03 MED ORDER — HYDROXYZINE HCL 25 MG PO TABS: 25.0000 mg | ORAL_TABLET | Freq: Four times a day (QID) | ORAL | 0 refills | Status: DC

## 2016-04-03 NOTE — ED Provider Notes (Signed)
CSN: [id number]     Arrival date & time 04/03/16  1019 History   First MD Initiated Contact with Patient 04/03/16 1141     Chief Complaint  Patient presents with  . Otalgia   (Consider location/radiation/quality/duration/timing/severity/associated sxs/prior Treatment) HPI Patient is a 70 year old male with right ear pain for the last 24 hours. States there is also some swelling of his earlobe. Denies any injury. Wears hearing aid and ear plugs. Past Medical History:  Diagnosis Date  . CAD (coronary artery disease)   . HLD (hyperlipidemia)   . Hypertension    Past Surgical History:  Procedure Laterality Date  . CARDIAC SURGERY    . CORONARY ARTERY BYPASS GRAFT     Family History  Problem Relation Age of Onset  . Cancer Mother     breast  . Heart disease Father    Social History  Substance Use Topics  . Smoking status: Never Smoker  . Smokeless tobacco: Never Used  . Alcohol use Yes    Review of Systems  Denies: HEADACHE, NAUSEA, ABDOMINAL PAIN, CHEST PAIN, CONGESTION, DYSURIA, SHORTNESS OF BREATH  Allergies  Review of patient's allergies indicates no known allergies.  Home Medications   Prior to Admission medications   Medication Sig Start Date End Date Taking? Authorizing Provider  amLODipine (NORVASC) 10 MG tablet Take 10 mg by mouth daily.   Yes Historical Provider, MD  clonazePAM (KLONOPIN) 0.5 MG tablet Take 0.5 mg by mouth 2 (two) times daily as needed for anxiety.   Yes Historical Provider, MD  gemfibrozil (LOPID) 600 MG tablet Take 600 mg by mouth 2 (two) times daily before a meal.   Yes Historical Provider, MD  HYDROcodone-acetaminophen (NORCO/VICODIN) 5-325 MG per tablet Take 1 tablet by mouth every 6 (six) hours as needed for moderate pain.   Yes Historical Provider, MD  lisinopril (PRINIVIL,ZESTRIL) 40 MG tablet Take 40 mg by mouth daily.   Yes Historical Provider, MD  metoprolol (LOPRESSOR) 50 MG tablet Take 50 mg by mouth 2 (two) times daily.   Yes  Historical Provider, MD  omeprazole (PRILOSEC) 20 MG capsule Take 20 mg by mouth daily.   Yes Historical Provider, MD  rosuvastatin (CRESTOR) 10 MG tablet Take 10 mg by mouth daily.   Yes Historical Provider, MD  temazepam (RESTORIL) 30 MG capsule Take 30 mg by mouth at bedtime as needed for sleep.   Yes Historical Provider, MD  amoxicillin-clavulanate (AUGMENTIN) 875-125 MG per tablet Take 1 tablet by mouth 2 (two) times daily. 09/24/14   Ozella Rocksavid J Merrell, MD  aspirin 325 MG EC tablet Take 325 mg by mouth daily.    Historical Provider, MD  cefUROXime (CEFTIN) 250 MG tablet Take 1 tablet (250 mg total) by mouth 2 (two) times daily with a meal. 10/27/13   Graylon GoodZachary H Baker, PA-C  cephALEXin (KEFLEX) 250 MG capsule Take 1 capsule (250 mg total) by mouth 3 (three) times daily. 04/03/16   Tharon AquasFrank C Patrick, PA  etodolac (LODINE) 400 MG tablet Take 400 mg by mouth 2 (two) times daily.    Historical Provider, MD  fenofibrate (TRIGLIDE) 50 MG tablet Take 50 mg by mouth daily.    Historical Provider, MD  fluticasone (FLONASE) 50 MCG/ACT nasal spray Place 2 sprays into both nostrils at bedtime. 09/24/14   Ozella Rocksavid J Merrell, MD  hydrOXYzine (ATARAX/VISTARIL) 25 MG tablet Take 1 tablet (25 mg total) by mouth every 6 (six) hours. 04/03/16   Tharon AquasFrank C Patrick, PA  ipratropium (ATROVENT) 0.06 % nasal  spray Place 2 sprays into both nostrils 4 (four) times daily. 09/24/14   Ozella Rocks, MD  neomycin-polymyxin-hydrocortisone (CORTISPORIN) otic solution 3-4 Drops, 4 times per day for 7 days 09/24/14   Ozella Rocks, MD  sildenafil (VIAGRA) 100 MG tablet Take 100 mg by mouth daily as needed for erectile dysfunction.    Historical Provider, MD   Meds Ordered and Administered this Visit  Medications - No data to display  BP 127/80 (BP Location: Right Arm)   Pulse 75   Temp 99.8 F (37.7 C) (Temporal)   SpO2 98%  No data found.   Physical Exam NURSES NOTES AND VITAL SIGNS REVIEWED. CONSTITUTIONAL: Well developed, well  nourished, no acute distress HEENT: normocephalic, atraumatic, Right right ear is obscured from visualization with cerumen impaction. Left ear is clear. EYES: Conjunctiva normal NECK:normal ROM, supple, no adenopathy PULMONARY:No respiratory distress, normal effort ABDOMINAL: Soft, ND, NT BS+, No CVAT MUSCULOSKELETAL: Normal ROM of all extremities,  SKIN: warm and dry without rash PSYCHIATRIC: Mood and affect, behavior are normal  Urgent Care Course   Clinical Course   Right ear is irrigated with removal of cerumen patient states he does hear better and pain is resolved. Procedures (including critical care time)  Labs Review Labs Reviewed - No data to display  Imaging Review No results found.   Visual Acuity Review  Right Eye Distance:   Left Eye Distance:   Bilateral Distance:    Right Eye Near:   Left Eye Near:    Bilateral Near:         MDM   1. Otalgia of right ear     Patient is reassured that there are no issues that require transfer to higher level of care at this time or additional tests. Patient is advised to continue home symptomatic treatment. Patient is advised that if there are new or worsening symptoms to attend the emergency department, contact primary care provider, or return to UC. Instructions of care provided discharged home in stable condition.    THIS NOTE WAS GENERATED USING A VOICE RECOGNITION SOFTWARE PROGRAM. ALL REASONABLE EFFORTS  WERE MADE TO PROOFREAD THIS DOCUMENT FOR ACCURACY.  I have verbally reviewed the discharge instructions with the patient. A printed AVS was given to the patient.  All questions were answered prior to discharge.      Tharon Aquas, PA 04/03/16 2103

## 2016-04-03 NOTE — ED Triage Notes (Signed)
Pt fell asleep with his hearing aids in on Friday night.  He awoke about 0300 with pain in his right ear.  Pt reports pain in the right ear with some pain in his right neck and cold chills.

## 2016-04-05 ENCOUNTER — Ambulatory Visit (HOSPITAL_COMMUNITY)
Admission: EM | Admit: 2016-04-05 | Discharge: 2016-04-05 | Disposition: A | Discharge status: Home / Self Care | Payer: Medicare Other | Attending: Emergency Medicine | Admitting: Emergency Medicine

## 2016-04-05 ENCOUNTER — Encounter (HOSPITAL_COMMUNITY): Payer: Self-pay | Admitting: Emergency Medicine

## 2016-04-05 DIAGNOSIS — H9201 Otalgia, right ear: Secondary | ICD-10-CM | POA: Insufficient documentation

## 2016-04-05 DIAGNOSIS — J029 Acute pharyngitis, unspecified: Secondary | ICD-10-CM | POA: Insufficient documentation

## 2016-04-05 DIAGNOSIS — H66001 Acute suppurative otitis media without spontaneous rupture of ear drum, right ear: Secondary | ICD-10-CM | POA: Diagnosis not present

## 2016-04-05 LAB — POCT RAPID STREP A: Streptococcus, Group A Screen (Direct): NEGATIVE

## 2016-04-05 MED ORDER — AMOXICILLIN 500 MG PO TABS: 1000.0000 mg | ORAL_TABLET | Freq: Three times a day (TID) | ORAL | 0 refills | Status: AC

## 2016-04-05 NOTE — Discharge Instructions (Signed)
Take antibiotic as prescribed. Follow up with a primary care provider or return here for re-evaluation.

## 2016-04-05 NOTE — ED Notes (Signed)
Throat swab in lab 

## 2016-04-05 NOTE — ED Triage Notes (Signed)
Patient was seen Monday 04/05/16.  Patient had a headache and ear pain and right side of throat pain.  Reports ears were irrigated at that time and was provided 2 medications.    Now right ear is swollen almost closed and continues to hurt.  Patient does not know if running a temperature at home.  Temp today 98.8 oral.  Overall patient does not feel well

## 2016-04-05 NOTE — ED Provider Notes (Signed)
CSN: [id number]     Arrival date & time 04/05/16  1343 History   First MD Initiated Contact with Patient 04/05/16 1519     Chief Complaint  Patient presents with  . Otalgia   (Consider location/radiation/quality/duration/timing/severity/associated sxs/prior Treatment) [name] is a well-appearing 70 y.o. Male, presents today for right ear pain. Patient went to sleep this past Friday (5 days ago) wearing his hearing aid and woke up the next morning with right ear ache and reports that the ear  went downhill from there. He was evaluated 2 days ago here for right otalgia and was found to have right cerumen impaction and the right ear was successfully irrigated. He was given Keflex and Hydroxyzine and have been taking both medication as prescribed. Patient reports the right ear pain has gotten worst and is having some equilibrium/balance problems. He also endorses decreased in hearing. He has tinnitus but has hx of tinnitus. He denies ear discharge. He feels like his right ear is swollen. He reports low grade fever at home for the past few days, he is not able to tell me how high his temp was. He is afebrile in room. He also reports right side sore throat.       Past Medical History:  Diagnosis Date  . CAD (coronary artery disease)   . HLD (hyperlipidemia)   . Hypertension    Past Surgical History:  Procedure Laterality Date  . CARDIAC SURGERY    . CORONARY ARTERY BYPASS GRAFT     Family History  Problem Relation Age of Onset  . Cancer Mother     breast  . Heart disease Father    Social History  Substance Use Topics  . Smoking status: Never Smoker  . Smokeless tobacco: Never Used  . Alcohol use Yes    Review of Systems  Constitutional: Positive for fatigue and fever. Negative for chills.  HENT: Positive for ear pain and sore throat. Negative for congestion.   Respiratory: Negative for cough and shortness of breath.   Cardiovascular: Negative for chest pain, palpitations and  leg swelling.  Gastrointestinal: Negative for abdominal pain, diarrhea, nausea and vomiting.  Neurological: Negative for dizziness, weakness, numbness and headaches.    Allergies  Review of patient's allergies indicates no known allergies.  Home Medications   Prior to Admission medications   Medication Sig Start Date End Date Taking? Authorizing Provider  amLODipine (NORVASC) 10 MG tablet Take 10 mg by mouth daily.    Historical Provider, MD  amoxicillin (AMOXIL) 500 MG tablet Take 2 tablets (1,000 mg total) by mouth every 8 (eight) hours. 04/05/16 04/12/16  Lucia Estelle, NP  amoxicillin-clavulanate (AUGMENTIN) 875-125 MG per tablet Take 1 tablet by mouth 2 (two) times daily. Patient not taking: Reported on 04/05/2016 09/24/14   Ozella Rocks, MD  aspirin 325 MG EC tablet Take 325 mg by mouth daily.    Historical Provider, MD  cefUROXime (CEFTIN) 250 MG tablet Take 1 tablet (250 mg total) by mouth 2 (two) times daily with a meal. Patient not taking: Reported on 04/05/2016 10/27/13   Graylon Good, PA-C  cephALEXin (KEFLEX) 250 MG capsule Take 1 capsule (250 mg total) by mouth 3 (three) times daily. 04/03/16   Tharon Aquas, PA  clonazePAM (KLONOPIN) 0.5 MG tablet Take 0.5 mg by mouth 2 (two) times daily as needed for anxiety.    Historical Provider, MD  etodolac (LODINE) 400 MG tablet Take 400 mg by mouth 2 (two) times daily.  Historical Provider, MD  fenofibrate (TRIGLIDE) 50 MG tablet Take 50 mg by mouth daily.    Historical Provider, MD  fluticasone (FLONASE) 50 MCG/ACT nasal spray Place 2 sprays into both nostrils at bedtime. 09/24/14   Ozella Rocks, MD  gemfibrozil (LOPID) 600 MG tablet Take 600 mg by mouth 2 (two) times daily before a meal.    Historical Provider, MD  HYDROcodone-acetaminophen (NORCO/VICODIN) 5-325 MG per tablet Take 1 tablet by mouth every 6 (six) hours as needed for moderate pain.    Historical Provider, MD  hydrOXYzine (ATARAX/VISTARIL) 25 MG tablet Take 1 tablet  (25 mg total) by mouth every 6 (six) hours. 04/03/16   Tharon Aquas, PA  ipratropium (ATROVENT) 0.06 % nasal spray Place 2 sprays into both nostrils 4 (four) times daily. 09/24/14   Ozella Rocks, MD  lisinopril (PRINIVIL,ZESTRIL) 40 MG tablet Take 40 mg by mouth daily.    Historical Provider, MD  metoprolol (LOPRESSOR) 50 MG tablet Take 50 mg by mouth 2 (two) times daily.    Historical Provider, MD  neomycin-polymyxin-hydrocortisone (CORTISPORIN) otic solution 3-4 Drops, 4 times per day for 7 days 09/24/14   Ozella Rocks, MD  omeprazole (PRILOSEC) 20 MG capsule Take 20 mg by mouth daily.    Historical Provider, MD  rosuvastatin (CRESTOR) 10 MG tablet Take 10 mg by mouth daily.    Historical Provider, MD  sildenafil (VIAGRA) 100 MG tablet Take 100 mg by mouth daily as needed for erectile dysfunction.    Historical Provider, MD  temazepam (RESTORIL) 30 MG capsule Take 30 mg by mouth at bedtime as needed for sleep.    Historical Provider, MD   Meds Ordered and Administered this Visit  Medications - No data to display  BP 128/87 (BP Location: Right Arm)   Pulse 81   Temp 98.8 F (37.1 C) (Oral)   Resp 16   SpO2 98%  No data found.   Physical Exam  Constitutional: He is oriented to person, place, and time. He appears well-developed and well-nourished.  HENT:  Head: Normocephalic and atraumatic.  Right external ear is red and swollen. Purulent drainage noted on the external ear canal. R TM is bulging and injected. Left ear normal and L TM unremarkable. Mastoid process unremarkable. Right Tonsil is swollen compared to left with presence of exudate.   Eyes: EOM are normal. Pupils are equal, round, and reactive to light.  Neck: Normal range of motion.  Cardiovascular: Normal rate, regular rhythm and normal heart sounds.   Pulmonary/Chest: Effort normal and breath sounds normal. No respiratory distress. He has no wheezes.  Abdominal: Soft. Bowel sounds are normal. He exhibits no  distension. There is no tenderness.  Musculoskeletal: Normal range of motion.  Lymphadenopathy:    He has no cervical adenopathy.  Neurological: He is alert and oriented to person, place, and time.  Skin: Skin is warm and dry.  Nursing note and vitals reviewed.   Urgent Care Course   Clinical Course    Procedures (including critical care time)  Labs Review Labs Reviewed  POCT RAPID STREP A    Imaging Review No results found.    MDM   1. Acute suppurative otitis media of right ear without spontaneous rupture of tympanic membrane, recurrence not specified    Rapid strep negative. Physical exam reveals right AOM. Patient is on 3rd day of Keflex with no improvement and reports that it is worsening. Will switch abx to Amoxicillin 1g Q8hrs x 7 days. Patient  informed that he must  f/u with a PCP next week for re-evaluation or return next week for re-evaluation. Patient denies any questions. Discharge paperwork given.     Lucia EstelleFeng Aoi Kouns, NP 04/05/16 1605

## 2016-04-07 ENCOUNTER — Telehealth (HOSPITAL_COMMUNITY): Payer: Self-pay | Admitting: Emergency Medicine

## 2016-04-07 MED ORDER — NEOMYCIN-POLYMYXIN-HC 3.5-10000-1 OT SUSP: OTIC | 0 refills | Status: DC

## 2016-04-07 NOTE — Telephone Encounter (Signed)
Called pt and notified him of ear drops being called in  Per his request.... I sent Rx over to [facility]([place]).... Also states he was able to p/u Amox  Voices no other concerns.... Notified pt to seek immediate attention if sx are getting worse.  Pt verb understanding.

## 2016-04-07 NOTE — Telephone Encounter (Signed)
-----   Message from Domenick GongAshley Mortenson, MD sent at 04/06/2016 10:12 AM EDT ----- Sounds like the patient has an otitis externa addition to otitis media. Please call and Cortisporin (polymyxin B/neomycin/hydrocortisone) otic suspension 3-4 drops in the affected ear 4 times a day for 7 days. Dispense 15 mL  Or 1 bottle.Marland Kitchen. generic okay. no refills.  Also let patient that he has a another prescription waiting for him. Please tell him that we need to treat the external ear infection as well as his internal ear infection, hence the ear drops. Thank you  Morrie SheldonAshley

## 2016-04-08 LAB — CULTURE, GROUP A STREP (THRC)

## 2016-04-13 ENCOUNTER — Encounter: Payer: Self-pay | Admitting: Physician Assistant

## 2016-04-13 ENCOUNTER — Ambulatory Visit (HOSPITAL_COMMUNITY)
Admission: RE | Admit: 2016-04-13 | Discharge: 2016-04-13 | Disposition: A | Discharge status: Home / Self Care | Payer: Medicare Other | Source: Ambulatory Visit | Attending: Physician Assistant | Admitting: Physician Assistant

## 2016-04-13 ENCOUNTER — Ambulatory Visit (INDEPENDENT_AMBULATORY_CARE_PROVIDER_SITE_OTHER): Payer: Medicare Other | Admitting: Physician Assistant

## 2016-04-13 VITALS — BP 145/82 | HR 83 | Resp 16 | Wt 204.2 lb

## 2016-04-13 DIAGNOSIS — M79609 Pain in unspecified limb: Secondary | ICD-10-CM | POA: Diagnosis not present

## 2016-04-13 DIAGNOSIS — H9201 Otalgia, right ear: Secondary | ICD-10-CM | POA: Diagnosis not present

## 2016-04-13 DIAGNOSIS — R29810 Facial weakness: Secondary | ICD-10-CM | POA: Diagnosis not present

## 2016-04-13 DIAGNOSIS — R202 Paresthesia of skin: Secondary | ICD-10-CM | POA: Insufficient documentation

## 2016-04-13 DIAGNOSIS — R930 Abnormal findings on diagnostic imaging of skull and head, not elsewhere classified: Secondary | ICD-10-CM | POA: Insufficient documentation

## 2016-04-13 DIAGNOSIS — G510 Bell's palsy: Secondary | ICD-10-CM

## 2016-04-13 DIAGNOSIS — R51 Headache: Secondary | ICD-10-CM | POA: Diagnosis not present

## 2016-04-13 LAB — COMPLETE METABOLIC PANEL WITH GFR
ALT: 39 U/L (ref 9–46)
AST: 46 U/L — ABNORMAL HIGH (ref 10–35)
Albumin: 4.7 g/dL (ref 3.6–5.1)
Alkaline Phosphatase: 44 U/L (ref 40–115)
BUN: 14 mg/dL (ref 7–25)
CHLORIDE: 103 mmol/L (ref 98–110)
CO2: 25 mmol/L (ref 20–31)
Calcium: 11 mg/dL — ABNORMAL HIGH (ref 8.6–10.3)
Creat: 1.17 mg/dL (ref 0.70–1.18)
GFR, EST AFRICAN AMERICAN: 72 mL/min (ref >=60)
GFR, Est Non African American: 62 mL/min (ref >=60)
Glucose, Bld: 105 mg/dL — ABNORMAL HIGH (ref 65–99)
POTASSIUM: 4.8 mmol/L (ref 3.5–5.3)
SODIUM: 138 mmol/L (ref 135–146)
Total Bilirubin: 0.8 mg/dL (ref 0.2–1.2)
Total Protein: 7.2 g/dL (ref 6.1–8.1)

## 2016-04-13 LAB — POCT CBC
Granulocyte percent: 70.9 %G (ref 37–80)
HEMATOCRIT: 46.7 % (ref 43.5–53.7)
Hemoglobin: 16.7 g/dL (ref 14.1–18.1)
Lymph, poc: 1.2 (ref 0.6–3.4)
MCH: 31.6 pg — AB (ref 27–31.2)
MCHC: 35.7 g/dL — AB (ref 31.8–35.4)
MCV: 88.4 fL (ref 80–97)
MID (cbc): 0.6 (ref 0–0.9)
MPV: 8.4 fL (ref 0–99.8)
POC Granulocyte: 4.5 (ref 2–6.9)
POC LYMPH PERCENT: 19.2 %L (ref 10–50)
POC MID %: 9.9 % (ref 0–12)
Platelet Count, POC: 265 10*3/uL (ref 142–424)
RBC: 5.28 M/uL (ref 4.69–6.13)
RDW, POC: 13 %
WBC: 6.4 10*3/uL (ref 4.6–10.2)

## 2016-04-13 MED ORDER — CIPROFLOXACIN-HYDROCORTISONE 0.2-1 % OT SUSP: 3.0000 [drp] | Freq: Two times a day (BID) | OTIC | 0 refills | Status: AC

## 2016-04-13 MED ORDER — PREDNISONE 20 MG PO TABS: ORAL_TABLET | ORAL | 0 refills | Status: DC

## 2016-04-13 MED ORDER — VALACYCLOVIR HCL 1 G PO TABS: 1000.0000 mg | ORAL_TABLET | Freq: Three times a day (TID) | ORAL | 0 refills | Status: AC

## 2016-04-13 MED ORDER — ARTIFICIAL TEARS OP OINT: TOPICAL_OINTMENT | OPHTHALMIC | 0 refills | Status: DC | PRN

## 2016-04-13 NOTE — Patient Instructions (Addendum)
Please make sure that you are hydrating well, and WATCH your blood sugar at this time.   Please take the prednisone as prescribed.  I would like you to follow up in 1 week I will contact you of the results of your blood work.  Please await phone call. You can also pick up artificial tears and use the drops to help lubricate the eye.  Bell Palsy Bell palsy is a condition in which the muscles on one side of the face become paralyzed. This often causes one side of the face to droop. It is a common condition and most people recover completely. RISK FACTORS Risk factors for Bell palsy include:  Pregnancy.  Diabetes.  An infection by a virus, such as infections that cause cold sores. CAUSES  Bell palsy is caused by damage to or inflammation of a nerve in your face. It is unclear why this happens, but an infection by a virus may lead to it. Most of the time the reason it happens is unknown. SIGNS AND SYMPTOMS  Symptoms can range from mild to severe and can take place over a number of hours. Symptoms may include:  Being unable to:  Raise one or both eyebrows.  Close one or both eyes.  Feel parts of your face (facial numbness).  Drooping of the eyelid and corner of the mouth.  Weakness in the face.  Paralysis of half your face.  Loss of taste.  Sensitivity to loud noises.  Difficulty chewing.  Tearing up of the affected eye.  Dryness in the affected eye.  Drooling.  Pain behind one ear. DIAGNOSIS  Diagnosis of Bell palsy may include:  A medical history and physical exam.  An MRI.  A CT scan.  Electromyography (EMG). This is a test that checks how your nerves are working. TREATMENT  Treatment may include antiviral medicine to help shorten the length of the condition. Sometimes treatment is not needed and the symptoms go away on their own. HOME CARE INSTRUCTIONS   Take medicines only as directed by your health care provider.  Do facial massages and exercises as  directed by your health care provider.  If your eye is affected:  Use moisturizing eye drops to prevent drying of your eye as directed by your health care provider.  Protect your eye as directed by your health care provider. SEEK MEDICAL CARE IF:  Your symptoms do not get better or get worse.  You are drooling.  Your eye is red, irritated, or hurts. SEEK IMMEDIATE MEDICAL CARE IF:   Another part of your body feels weak or numb.  You have difficulty swallowing.  You have a fever along with symptoms of Bell palsy.  You develop neck pain. MAKE SURE YOU:   Understand these instructions.  Will watch your condition.  Will get help right away if you are not doing well or get worse.   This information is not intended to replace advice given to you by your health care provider. Make sure you discuss any questions you have with your health care provider.   Document Released: 06/26/2005 Document Revised: 03/17/2015 Document Reviewed: 10/03/2013 Elsevier Interactive Patient Education [employer]2016 Elsevier Inc.

## 2016-04-14 DIAGNOSIS — G510 Bell's palsy: Secondary | ICD-10-CM | POA: Diagnosis not present

## 2016-04-14 NOTE — Progress Notes (Signed)
[facility] [address], [place] [address] [phone]  Date:  04/13/2016   Name:  Robert Barrett   DOB:  1945/11/29   MRN:  [id number]  PCP:  No PCP Per Patient    History of Present Illness:  Robert Barrett is a 70 y.o. male patient with PMH of CAD, presbycusis who presents to [facility] for cc of ear pain and facial drooping.  Patient reports that 2 weeks ago, he was seen at the ED for possible otitis externa. He was given keflex and hydroxyzine. He returned to the ED when his ear pain worsened one week ago and he was given amoxicillin with keflex discontinued. 2 days ago, he reports that he started to develop facial drooping. He is unable to close his right eye. His also noticed that he cannot chew the way he normally does. The ear pain of his right ear has not improved.  He notices no weakness on his right extremities, but does report that there is some left extremity tingling which may have been about 2 days ago.  He also reports dysequilibrium with ambulation, that began with the start of his symptoms, 2 weeks ago.  There are no active problems to display for this patient.   Past Medical History:  Diagnosis Date  . CAD (coronary artery disease)   . HLD (hyperlipidemia)   . Hypertension     Past Surgical History:  Procedure Laterality Date  . CARDIAC SURGERY    . CORONARY ARTERY BYPASS GRAFT      Social History  Substance Use Topics  . Smoking status: Never Smoker  . Smokeless tobacco: Never Used  . Alcohol use Yes    Family History  Problem Relation Age of Onset  . Cancer Mother     breast  . Heart disease Father     No Known Allergies  Medication list has been reviewed and updated.  Current Outpatient Prescriptions on File Prior to Visit  Medication Sig Dispense Refill  . amLODipine (NORVASC) 10 MG tablet Take 10 mg by mouth daily.    Marland Kitchen amoxicillin-clavulanate (AUGMENTIN) 875-125 MG per tablet Take 1 tablet by mouth 2 (two) times  daily. (Patient not taking: Reported on 04/05/2016) 20 tablet 0  . aspirin 325 MG EC tablet Take 325 mg by mouth daily.    . cefUROXime (CEFTIN) 250 MG tablet Take 1 tablet (250 mg total) by mouth 2 (two) times daily with a meal. (Patient not taking: Reported on 04/05/2016) 10 tablet 0  . cephALEXin (KEFLEX) 250 MG capsule Take 1 capsule (250 mg total) by mouth 3 (three) times daily. 15 capsule 0  . clonazePAM (KLONOPIN) 0.5 MG tablet Take 0.5 mg by mouth 2 (two) times daily as needed for anxiety.    Marland Kitchen etodolac (LODINE) 400 MG tablet Take 400 mg by mouth 2 (two) times daily.    . fenofibrate (TRIGLIDE) 50 MG tablet Take 50 mg by mouth daily.    . fluticasone (FLONASE) 50 MCG/ACT nasal spray Place 2 sprays into both nostrils at bedtime. 16 g 0  . gemfibrozil (LOPID) 600 MG tablet Take 600 mg by mouth 2 (two) times daily before a meal.    . HYDROcodone-acetaminophen (NORCO/VICODIN) 5-325 MG per tablet Take 1 tablet by mouth every 6 (six) hours as needed for moderate pain.    . hydrOXYzine (ATARAX/VISTARIL) 25 MG tablet Take 1 tablet (25 mg total) by mouth every 6 (six) hours. 12 tablet 0  . ipratropium (  ATROVENT) 0.06 % nasal spray Place 2 sprays into both nostrils 4 (four) times daily. 15 mL 12  . lisinopril (PRINIVIL,ZESTRIL) 40 MG tablet Take 40 mg by mouth daily.    . metoprolol (LOPRESSOR) 50 MG tablet Take 50 mg by mouth 2 (two) times daily.    Marland Kitchen neomycin-polymyxin-hydrocortisone (CORTISPORIN) 3.5-10000-1 otic suspension 3-4 drops in affected ear 4 times a day for 7 days. 15 mL 0  . omeprazole (PRILOSEC) 20 MG capsule Take 20 mg by mouth daily.    . rosuvastatin (CRESTOR) 10 MG tablet Take 10 mg by mouth daily.    . sildenafil (VIAGRA) 100 MG tablet Take 100 mg by mouth daily as needed for erectile dysfunction.    . temazepam (RESTORIL) 30 MG capsule Take 30 mg by mouth at bedtime as needed for sleep.     No current facility-administered medications on file prior to visit.      ROS   Physical Examination: BP (!) 145/82 (BP Location: Right Arm, Cuff Size: Normal)   Pulse 83   Resp 16   Wt 204 lb 3.2 oz (92.6 kg)   SpO2 97%   BMI 29.30 kg/m  Ideal Body Weight:    Physical Exam  Constitutional: He is oriented to person, place, and time. He appears well-developed and well-nourished. No distress.  HENT:  Head: Normocephalic and atraumatic.  Mouth/Throat: No oropharyngeal exudate, posterior oropharyngeal edema or posterior oropharyngeal erythema.  Right sided naso-labial diminished crease.  Right lateral eye drooping.   Right ear with dry whitened materials consistent with possible cerumen.  Erythematous lesion at the 4 oclock region.    Eyes: Conjunctivae and EOM are normal. Pupils are equal, round, and reactive to light.  Right eye without complete closure.  Weakness of eyelid strength.    Cardiovascular: Normal rate.   Pulmonary/Chest: Effort normal. No respiratory distress.  Neurological: He is alert and oriented to person, place, and time.   Extremity strength normal throughout upper and lower extremities, good grip strength.  Rapid hand and foot movement well.  Speech normal.   Unable to puff cheeks with weakened right side of mouth, and asymmetrical grin--CN7 Good strength of accessory muscles.  Oculomotor normal. There is some word finding (wife reports normal with angst)  Skin: Skin is warm and dry. He is not diaphoretic.  Psychiatric: He has a normal mood and affect. His behavior is normal.     Assessment and Plan: Robert Barrett is a 70 y.o. male who is here today for rash of ear, and facial drooping.  Diff dx: Bell's Palsy, Ramsay Hunt Syndrome, low likelihood (stroke/TIA),  More likely Bell's Palsy and possible Ramsay Hunt.  Facial nerve vii appears to be most attacked and likely Bell's. Due to patient history we will proceed with a CT imaging to rule out CVA. I am prescribing him today with an antiviral medication as well as a  prednisone taper. We discussed monitoring his diabetes as well as managing his eating and alcohol intake at this time. At this time, ear nose and throat consult is appreciated. I will place an urgent referral at this time. Discussed using a Lacri-Lube ointment to keep his eye well lubricated. He can also just purchase artificial tears. He will return in 1 week for recheck unless following up with ENT. Facial droop - Plan: CT Head Wo Contrast, POCT CBC, COMPLETE METABOLIC PANEL WITH GFR, Ambulatory referral to ENT, CANCELED: Ambulatory referral to ENT  Paresthesia and pain of left extremity - Plan: CT  Head Wo Contrast, POCT CBC, COMPLETE METABOLIC PANEL WITH GFR, Ambulatory referral to ENT  Ear pain, right - Plan: Ambulatory referral to ENT, CANCELED: Ambulatory referral to ENT  Bell's palsy - Plan: CANCELED: Ambulatory referral to ENT   Trena PlattStephanie Isael Stille, PA-C [facility] 04/14/2016 8:08 AM

## 2016-04-20 DIAGNOSIS — G510 Bell's palsy: Secondary | ICD-10-CM | POA: Diagnosis not present

## 2016-04-27 DIAGNOSIS — G510 Bell's palsy: Secondary | ICD-10-CM | POA: Diagnosis not present

## 2017-02-13 DIAGNOSIS — I10 Essential (primary) hypertension: Secondary | ICD-10-CM | POA: Diagnosis not present

## 2017-02-13 DIAGNOSIS — I2510 Atherosclerotic heart disease of native coronary artery without angina pectoris: Secondary | ICD-10-CM | POA: Diagnosis not present

## 2017-02-13 DIAGNOSIS — I252 Old myocardial infarction: Secondary | ICD-10-CM | POA: Diagnosis not present

## 2017-03-02 ENCOUNTER — Telehealth: Payer: Self-pay | Admitting: Internal Medicine

## 2017-03-02 NOTE — Telephone Encounter (Signed)
Patient is on 2018 [facility] Patient Reassign ACO Priority list.  Called patient @ [phone] and spoke to him regarding having Dr. Beryle Quant name listed as PCP on his [facility] card.  States that he goes to the [place] in [place] every 6 months.  Asked who he sees there; Dr. Cleatis Polka.  Asked about hims seeing Dr. Judeth Cornfield English here and he advised he doesn't see her.  He states that he does see Dr. Viann Fish.  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  He can continue to see him, he just cannot list him as a PCP.  Patient finally stated that Dr. Donnie Aho told him that he needed to look for a PCP here and that he would need to look for one that would accept Medicare patient's.  I advised that I am calling him today in that regard.  I again asked if he would like to establish with Dr. Lenord Fellers at this time??    Patient advised that he guesses he'll just contact the [facility] for now and see what they say.  And, maybe he'll call us back later on.  For now, he'll just continue to see Dr[facility] Beverely Pace at the [facility] [place] since she already see's him twice a year.    Advised if we can help him any further, to please call us back.    Call #1.

## 2017-08-06 IMAGING — CT CT HEAD W/O CM
3 series · 16 of 47 positions shown, 19 images · non-contrast
Comparison: None.

CLINICAL DATA: Right-sided headaches for 2 days. Difficulty closing
the right thigh. Right IIA weakness and right facial droop.

EXAM:
CT HEAD WITHOUT CONTRAST
TECHNIQUE: Contiguous axial images were obtained from the base of the skull
through the vertex without intravenous contrast.

[Series 2: headseq 4.8 h45s · axial · 0.44mm/px · z∈[-122,+8]mm · 10 of 33 slices shown, 13 images]
[im 3/33  brain]
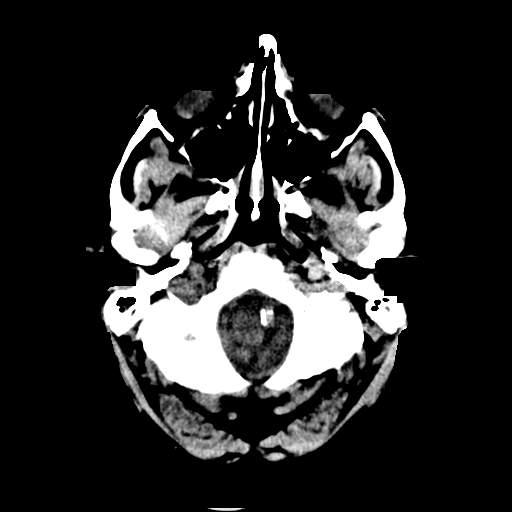
[im 3/33  bone]
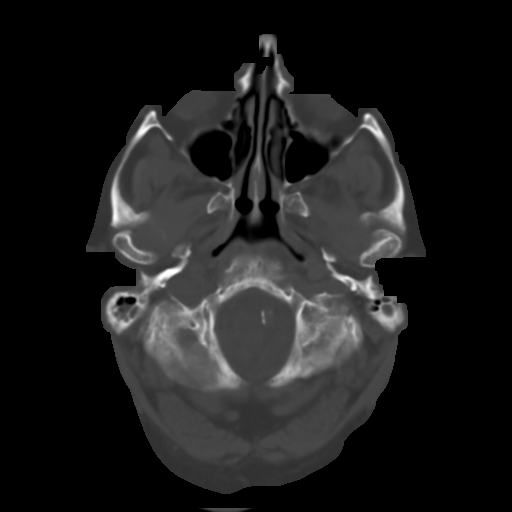
[im 6/33  brain]
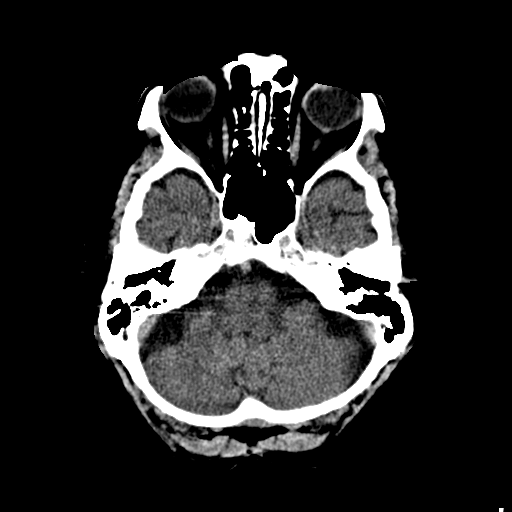
[im 9/33  brain]
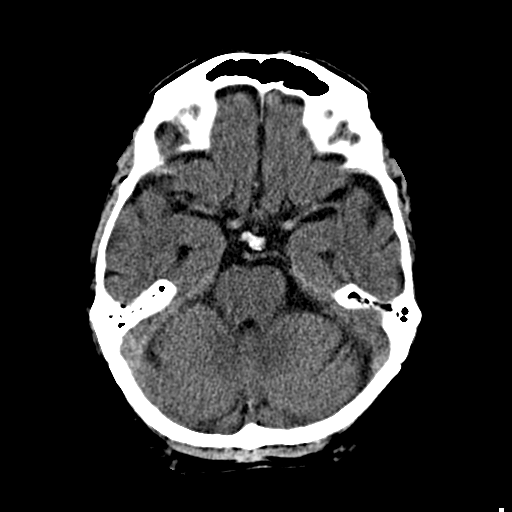
[im 12/33  brain]
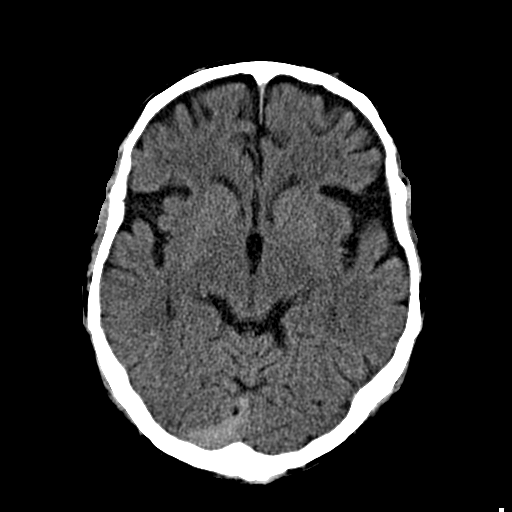
[im 15/33  brain]
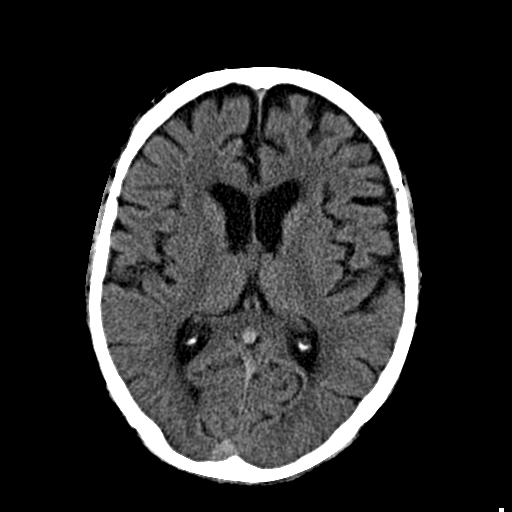
[im 15/33  bone]
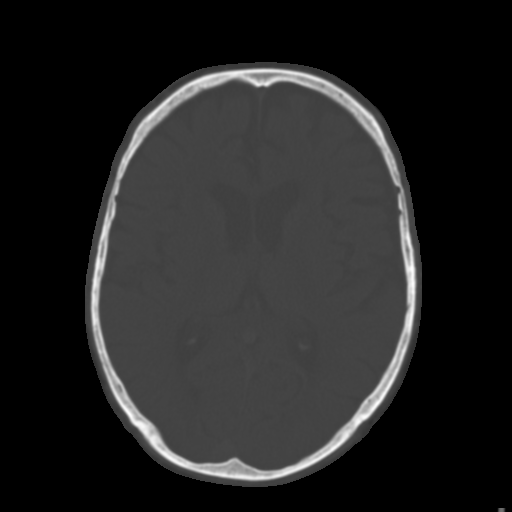
[im 18/33  brain]
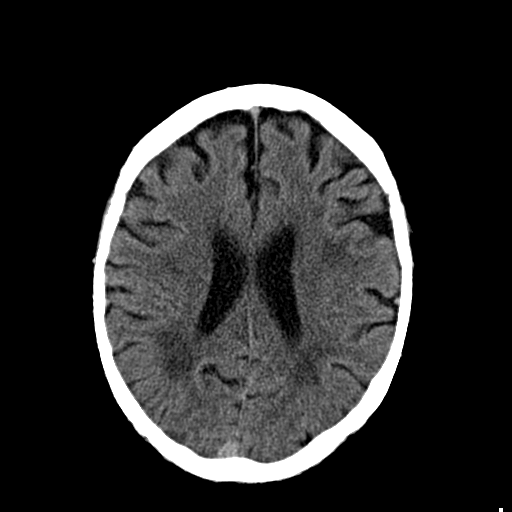
[im 21/33  brain]
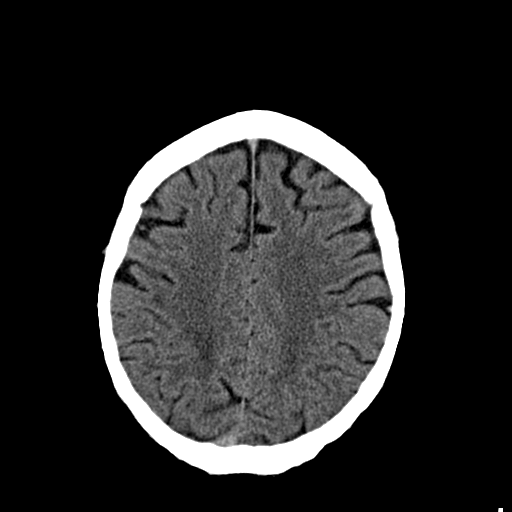
[im 25/33  brain]
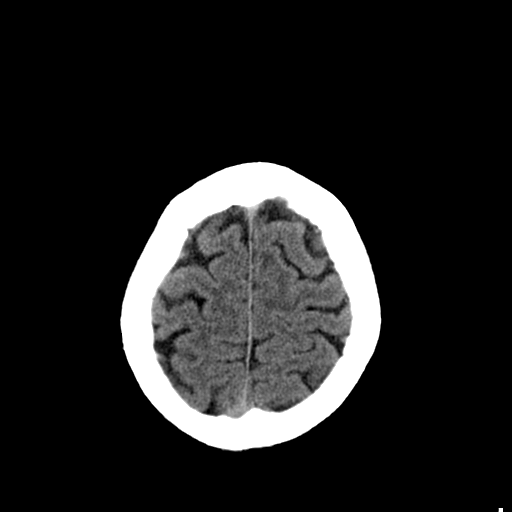
[im 27/33  brain]
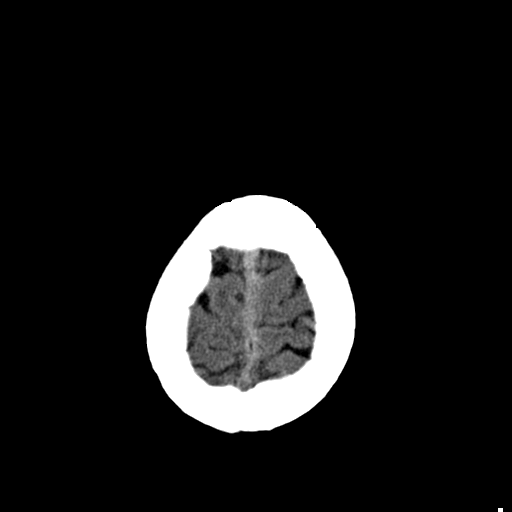
[im 27/33  bone]
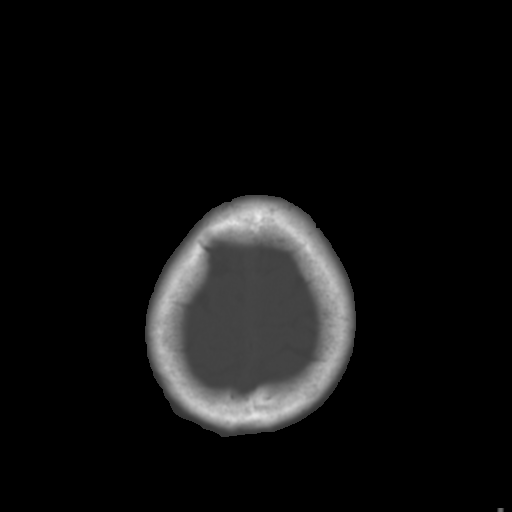
[im 30/33  brain]
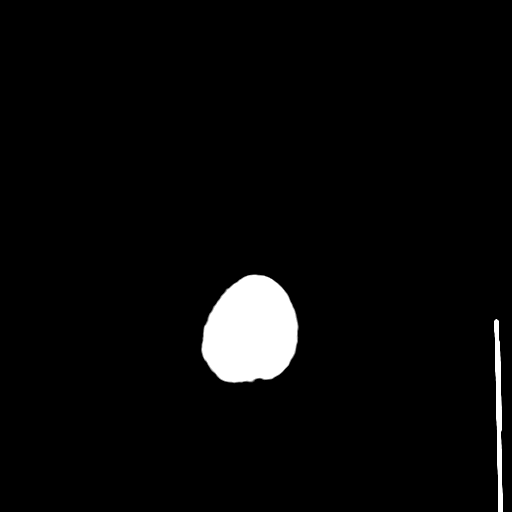

[Series 602: <mpr thick range> · coronal · 0.50mm/px · 3 of 71 slices shown]
[im 24/71  brain]
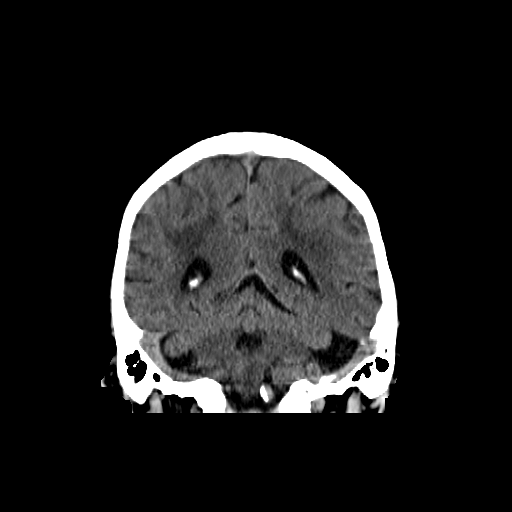
[im 32/71  brain]
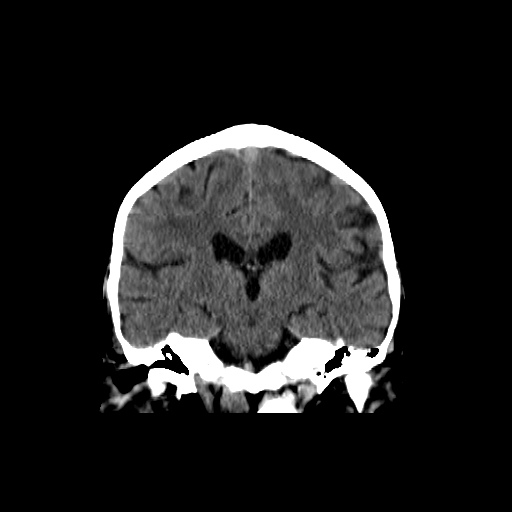
[im 39/71  brain]
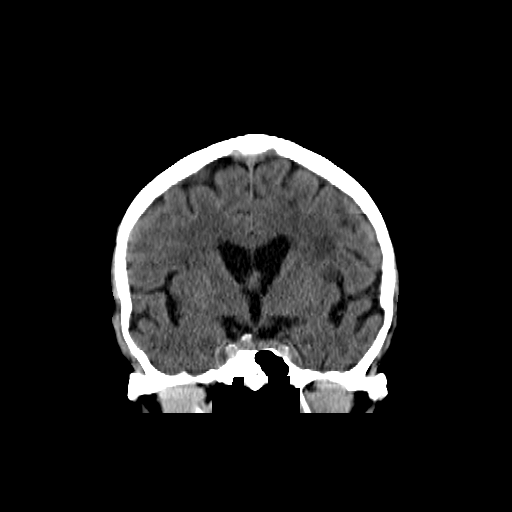

[Series 603: <mpr thick range(1)> · sagittal · 0.50mm/px · 3 of 54 slices shown]
[im 18/54  brain]
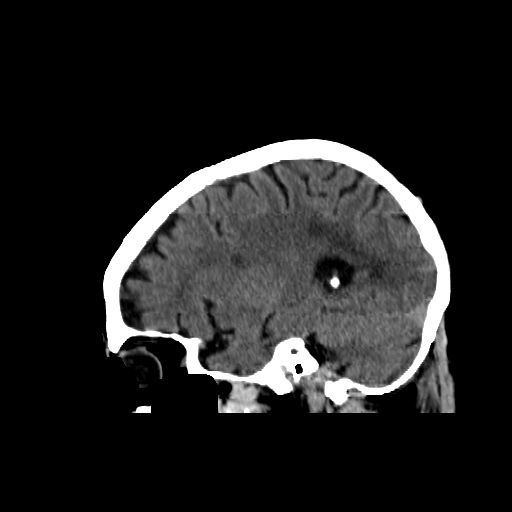
[im 27/54  brain]
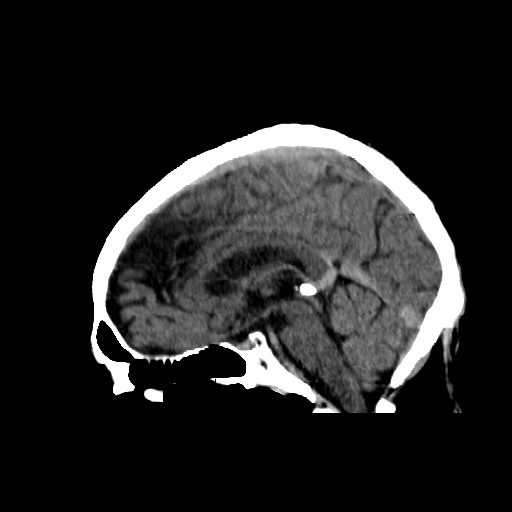
[im 36/54  brain]
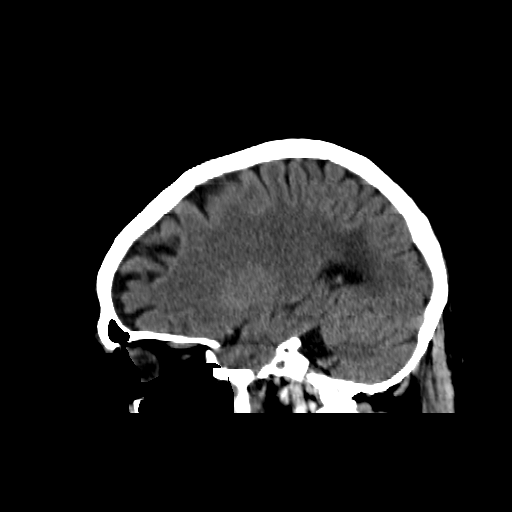

[16 of 47 positions shown; findings below may reference images not displayed]

FINDINGS: Brain: The brainstem, cerebral peduncles, cerebellum, thalami, basal
ganglia, and basilar cisterns appear normal. Ventricular system
unremarkable.

Periventricular white matter and corona radiata hypodensities favor
chronic ischemic microvascular white matter disease. The
periventricular white matter hypodensities are slightly more
confluent posteriorly.

No intracranial hemorrhage, mass lesion, or acute CVA.

Vascular: Mild atherosclerotic calcification of the cavernous
carotid arteries.

Skull: Unremarkable

Sinuses/Orbits: Small bilateral mastoid effusions. No middle ear
fluid identified.

Other: No supplemental non-categorized findings.
IMPRESSION: 1. Patchy periventricular white matter and corona radiata
hypodensities favor chronic ischemic microvascular white matter
disease. These are slightly more confluent in the parietal lobes
bilaterally, if the patient is hypertensive then the possibility of
posterior reversible encephalopathy might be considered.
2. No acute CVA, mass lesion, or an intracranial hemorrhage
identified.
3. If clinically warranted, MRI could be utilized for further
assessment.
These results will be called to the ordering clinician or
representative by the Radiologist Assistant, and communication
documented in the PACS or zVision Dashboard.

## 2017-12-28 ENCOUNTER — Telehealth: Payer: Self-pay | Admitting: Internal Medicine

## 2017-12-28 NOTE — Telephone Encounter (Signed)
[facility] Data thru 11/06/2017 May Report This patient is NOT a patient of our office.  I spoke with the patient on 03/02/17 at which time he was on the [facility] Patient Reassign [facility]CO Priority list.    He continues to be seen [facility] every 6 months.  He states that he is happy with the care that he receives by the [facility] and they fill his medications.  He has not done anything to have Dr. Lenord FellersBaxley removed from his insurance card and states that he doesn't know how to do that.  I asked him to contact [facility] and ask them to remove it.  He doesn't seem real compelled to do so because he is going to continue to go to [facility] for medical treatment.  He also see Dr. Viann FishSpencer Tilley as his Cardiologist.  He is not interested in establishing care with Dr. Lenord FellersBaxley as his PCP at this time.    I advised that I will be calling him 1 more time to inquire about becoming a patient of Dr. Beryle QuantBaxley's.  Thereafter, I will leave him alone and he will be [facility] from the [facility] list.    Call #2.

## 2018-01-30 ENCOUNTER — Telehealth: Payer: Self-pay | Admitting: Internal Medicine

## 2018-01-30 NOTE — Telephone Encounter (Signed)
[facility] June [facility].   This patient is NOT a patient of our office.  I spoke with patient on 03/02/17 and 12/28/17 as well.  He continues to be seen [facility] every 6 months.  He tells me still today that he is happy with the care that he receives by the [facility] and they fill his medications.  He has not done anything to have Dr. Lenord FellersBaxley removed from his insurance card and states that he doesn't know how to do that.  I provided him instructions on 12/28/17 on how to go about doing this.    He still is not interested in establishing care with Dr. Lenord FellersBaxley as his PCP at this time.  I told him today that I will not be calling him anymore after today.  I will be completing a form to have Dr. Lenord FellersBaxley removed from his card and we will leave him alone after today's card.  I wished him well and hung up.    I am completing the form to have him removed from our attribution list through [facility].

## 2018-03-01 DIAGNOSIS — I2510 Atherosclerotic heart disease of native coronary artery without angina pectoris: Secondary | ICD-10-CM | POA: Diagnosis not present

## 2018-03-01 DIAGNOSIS — I10 Essential (primary) hypertension: Secondary | ICD-10-CM | POA: Diagnosis not present

## 2018-03-01 DIAGNOSIS — E785 Hyperlipidemia, unspecified: Secondary | ICD-10-CM | POA: Diagnosis not present

## 2018-04-09 ENCOUNTER — Ambulatory Visit: Payer: Medicare Other | Admitting: Gastroenterology

## 2019-03-18 ENCOUNTER — Telehealth: Payer: Self-pay | Admitting: Interventional Cardiology

## 2019-03-18 NOTE — Telephone Encounter (Signed)
New Message       *STAT* If patient is at the pharmacy, call can be transferred to refill team.   1. Which medications need to be refilled? (please list name of each medication and dose if known) fenofibrate 160 mg 1 x daily   2. Which pharmacy/location (including street and city if local pharmacy) is medication to be sent to? [facility] on [address]   3. Do they need a 30 day or 90 day supply? 90     Pt is out of medication

## 2019-03-20 ENCOUNTER — Ambulatory Visit: Payer: Medicare Other | Admitting: Interventional Cardiology

## 2019-03-20 MED ORDER — FENOFIBRATE 50 MG PO CAPS: 50.0000 mg | ORAL_CAPSULE | Freq: Every day | ORAL | 0 refills | Status: DC

## 2019-03-20 NOTE — Telephone Encounter (Signed)
OK to refill

## 2019-03-20 NOTE — Telephone Encounter (Signed)
Pt calling requesting a refill on fenofibrate. Pt has an appt with Dr. Irish Lack on March 24, 2019, former Dr. Wynonia Lawman pt. Would Dr. Irish Lack like to refill this medication? Please address

## 2019-03-20 NOTE — Telephone Encounter (Signed)
Order sent in 

## 2019-03-20 NOTE — Telephone Encounter (Signed)
Algis Greenhouse, RN, I am unable to reorder Fenofibrate. I don't know why. Could you help me with this please. Thanks

## 2019-03-20 NOTE — Addendum Note (Signed)
Addended by: Drue Novel I on: 03/20/2019 05:10 PM   Modules accepted: Orders

## 2019-03-23 NOTE — Progress Notes (Signed)
Cardiology Office Note   Date:  03/24/2019   ID:  Robert Barrett, DOB 01/01/1946, MRN [id number]  [facility]    No chief complaint on file.  CAD  Wt Readings from Last 3 Encounters:  03/24/19 209 lb (94.8 kg)  04/13/16 204 lb 3.2 oz (92.6 kg)  10/10/13 220 lb (99.8 kg)       History of Present Illness: Robert Barrett is a 73 y.o. male  Who saw Dr. Donnie AhoIlley in the past.   He has a h/o inferior MI in 2002. He had stents placed.  In 2008, he had CABG with LIMA to LAD and SVG to OM.  MI in the past was severe chest pressure in 2002.  Angina in 2008, sx were not as severe.  He has done well since that time: 8/19 TC 137, HDL <15, LDL n/a, TG 195.  He was diagnosed with OSA and wears CPAP.  He tries to be compliant.  Gym has closed due to COVID-19.  He walks a lot at work.    Denies : Chest pain. Dizziness. Leg edema. Nitroglycerin use. Orthopnea. Palpitations. Paroxysmal nocturnal dyspnea. Shortness of breath. Syncope.   [facility].  Blood sugars are controlled with diet.  He avoids sweets.          Past Medical History:  Diagnosis Date  . Anxiety   . CAD (coronary artery disease)   . GERD (gastroesophageal reflux disease)   . HLD (hyperlipidemia)   . Hypertension   . Insomnia   . Sleep apnea   . Type 2 diabetes mellitus (HCC)     Past Surgical History:  Procedure Laterality Date  . CARDIAC SURGERY    . CORONARY ARTERY BYPASS GRAFT       Current Outpatient Medications  Medication Sig Dispense Refill  . amLODipine (NORVASC) 10 MG tablet Take 10 mg by mouth daily.    Marland Kitchen. amoxicillin-clavulanate (AUGMENTIN) 875-125 MG per tablet Take 1 tablet by mouth 2 (two) times daily. 20 tablet 0  . artificial tears (LACRILUBE) OINT ophthalmic ointment Place into the right eye every 4 (four) hours as needed for dry eyes. 3.5 g 0  . aspirin 325 MG EC tablet Take 325 mg by mouth daily.    . cefUROXime (CEFTIN) 250 MG tablet  Take 1 tablet (250 mg total) by mouth 2 (two) times daily with a meal. 10 tablet 0  . cephALEXin (KEFLEX) 250 MG capsule Take 1 capsule (250 mg total) by mouth 3 (three) times daily. 15 capsule 0  . clonazePAM (KLONOPIN) 0.5 MG tablet Take 0.5 mg by mouth 2 (two) times daily as needed for anxiety.    Marland Kitchen. etodolac (LODINE) 400 MG tablet Take 400 mg by mouth 2 (two) times daily.    . fluticasone (FLONASE) 50 MCG/ACT nasal spray Place 2 sprays into both nostrils at bedtime. 16 g 0  . gemfibrozil (LOPID) 600 MG tablet Take 600 mg by mouth 2 (two) times daily before a meal.    . HYDROcodone-acetaminophen (NORCO/VICODIN) 5-325 MG per tablet Take 1 tablet by mouth every 6 (six) hours as needed for moderate pain.    . hydrOXYzine (ATARAX/VISTARIL) 25 MG tablet Take 1 tablet (25 mg total) by mouth every 6 (six) hours. 12 tablet 0  . ipratropium (ATROVENT) 0.06 % nasal spray Place 2 sprays into both nostrils 4 (four) times daily. 15 mL 12  . lisinopril (PRINIVIL,ZESTRIL) 40 MG tablet Take 40  mg by mouth daily.    . metoprolol (LOPRESSOR) 50 MG tablet Take 50 mg by mouth 2 (two) times daily.    Marland Kitchen neomycin-polymyxin-hydrocortisone (CORTISPORIN) 3.5-10000-1 otic suspension 3-4 drops in affected ear 4 times a day for 7 days. 15 mL 0  . omeprazole (PRILOSEC) 20 MG capsule Take 20 mg by mouth daily.    . predniSONE (DELTASONE) 20 MG tablet Take 3 PO QAM x3days, 2 PO QAM x3days, 1 PO QAM x3days 18 tablet 0  . rosuvastatin (CRESTOR) 10 MG tablet Take 10 mg by mouth daily.    . sildenafil (VIAGRA) 100 MG tablet Take 100 mg by mouth daily as needed for erectile dysfunction.    . temazepam (RESTORIL) 30 MG capsule Take 30 mg by mouth at bedtime as needed for sleep.    . fenofibrate 160 MG tablet Take 1 tablet by mouth daily.     No current facility-administered medications for this visit.     Allergies:   Patient has no known allergies.    Social History:  The patient  reports that he has never smoked. He has  never used smokeless tobacco. He reports current alcohol use. He reports that he does not use drugs.   Family History:  The patient's family history includes Cancer in his mother; Heart disease in his father.    ROS:  Please see the history of present illness.   Otherwise, review of systems are positive for some days has some difficulty with CPAP.   All other systems are reviewed and negative.    PHYSICAL EXAM: VS:  BP 124/72   Pulse 73   Ht 5\' 10"  (1.778 m)   Wt 209 lb (94.8 kg)   SpO2 98%   BMI 29.99 kg/m  , BMI Body mass index is 29.99 kg/m. GEN: Well nourished, well developed, in no acute distress  HEENT: normal  Neck: no JVD, carotid bruits, or masses Cardiac: RRR; no murmurs, rubs, or gallops,no edema  Respiratory:  clear to auscultation bilaterally, normal work of breathing GI: soft, nontender, nondistended, + BS MS: no deformity or atrophy  Skin: warm and dry, no rash Neuro:  Strength and sensation are intact Psych: euthymic mood, full affect   EKG:   The ekg ordered today demonstrates NSR, nonspecific ST changes   Recent Labs: No results found for requested labs within last 8760 hours.   Lipid Panel    Component Value Date/Time   CHOL  04/09/2007 0405    141        ATP III CLASSIFICATION:  <200     mg/dL   Desirable  200-239  mg/dL   Borderline High  >=240    mg/dL   High   TRIG 340 (H) 04/09/2007 0405   HDL 25 (L) 04/09/2007 0405   CHOLHDL 5.6 04/09/2007 0405   VLDL 68 (H) 04/09/2007 0405   LDLCALC  04/09/2007 0405    48        Total Cholesterol/HDL:CHD Risk Coronary Heart Disease Risk Table                     Men   Women  1/2 Average Risk   3.4   3.3     Other studies Reviewed: Additional studies/ records that were reviewed today with results demonstrating: Prior cath results reviewed.   ASSESSMENT AND PLAN:  1. CAD/Old MI: s/p CABG.  COntiue aggressive secondary prevention. 2. HTN: The current medical regimen is effective;  continue present  plan  and medications. 3. OSA: Using CPAP. 4. Hyperlipidemia: Labs checked at the [place].  He will give [place] a copy of the results.  Will refill his fenofibrate as this is not offered by the [place].   Current medicines are reviewed at length with the patient today.  The patient concerns regarding his medicines were addressed.  The following changes have been made:  No change  Labs/ tests ordered today include:  No orders of the defined types were placed in this encounter.   Recommend 150 minutes/week of aerobic exercise Low fat, low carb, high fiber diet recommended  Disposition:   FU in 1 year   Signed, Lance Muss, MD  03/24/2019 9:02 AM    [facility] [address] [place], [place], [place]  [address] Phone: [phone]; Fax: [phone]

## 2019-03-24 ENCOUNTER — Ambulatory Visit (INDEPENDENT_AMBULATORY_CARE_PROVIDER_SITE_OTHER): Payer: Medicare Other | Admitting: Interventional Cardiology

## 2019-03-24 ENCOUNTER — Other Ambulatory Visit: Payer: Self-pay

## 2019-03-24 ENCOUNTER — Encounter: Payer: Self-pay | Admitting: Interventional Cardiology

## 2019-03-24 VITALS — BP 124/72 | HR 73 | Ht 70.0 in | Wt 209.0 lb

## 2019-03-24 DIAGNOSIS — G4733 Obstructive sleep apnea (adult) (pediatric): Secondary | ICD-10-CM | POA: Diagnosis not present

## 2019-03-24 DIAGNOSIS — I2510 Atherosclerotic heart disease of native coronary artery without angina pectoris: Secondary | ICD-10-CM | POA: Diagnosis not present

## 2019-03-24 DIAGNOSIS — I10 Essential (primary) hypertension: Secondary | ICD-10-CM

## 2019-03-24 DIAGNOSIS — I25118 Atherosclerotic heart disease of native coronary artery with other forms of angina pectoris: Secondary | ICD-10-CM

## 2019-03-24 DIAGNOSIS — E782 Mixed hyperlipidemia: Secondary | ICD-10-CM

## 2019-03-24 MED ORDER — ASPIRIN EC 81 MG PO TBEC: 81.0000 mg | DELAYED_RELEASE_TABLET | Freq: Every day | ORAL | 3 refills | Status: AC

## 2019-03-24 MED ORDER — FENOFIBRATE 160 MG PO TABS: 160.0000 mg | ORAL_TABLET | Freq: Every day | ORAL | 3 refills | Status: DC

## 2019-03-24 NOTE — Patient Instructions (Signed)
Medication Instructions:  Your physician recommends that you continue on your current medications as directed. Please refer to the Current Medication list given to you today.  If you need a refill on your cardiac medications before your next appointment, please call your pharmacy.   Lab work: None Ordered  If you have labs (blood work) drawn today and your tests are completely normal, you will receive your results only by: Marland Kitchen MyChart Message (if you have MyChart) OR . A paper copy in the mail If you have any lab test that is abnormal or we need to change your treatment, we will call you to review the results.  Testing/Procedures: None Ordered  Follow-Up: At [facility], you and your health needs are our priority.  As part of our continuing mission to provide you with exceptional heart care, we have created designated Provider Care Teams.  These Care Teams include your primary Cardiologist (physician) and Advanced Practice Providers (APPs -  Physician Assistants and Nurse Practitioners) who all work together to provide you with the care you need, when you need it. You will need a follow up appointment in 1 years.  Please call our office 2 months in advance to schedule this appointment.  You may see Dr. Irish Lack or one of the following Advanced Practice Providers on your designated Care Team:   [place], PA-C Melina Copa, PA-C . Ermalinda Barrios, PA-C  Any Other Special Instructions Will Be Listed Below (If Applicable). Please drop off a copy of [facility] labs to our office.  If unable to locate, please call our office

## 2019-03-25 ENCOUNTER — Telehealth: Payer: Self-pay

## 2019-03-25 NOTE — Telephone Encounter (Signed)
I called [facility] pharmacy and was advised that a PA is not required and that the cost is $4 for the pts refill of Fenofibrate.

## 2019-03-25 NOTE — Telephone Encounter (Signed)
**Note De-Identified [name] Obfuscation** Letter received from [facility] stating that a PA is required on the pts Fenofibrate.  I did the PA through covermymeds and received the following message: Robert Barrett Key: [id number] - PA Case ID: [id number]  Outcome  This medication or product is on your plan's list of covered drugs. Prior authorization is not required at this time. If your pharmacy has questions regarding the processing of your prescription, please have them call the [facility] pharmacy help desk at ([phone].

## 2020-04-19 ENCOUNTER — Other Ambulatory Visit: Payer: Self-pay

## 2020-04-19 MED ORDER — FENOFIBRATE 160 MG PO TABS: 160.0000 mg | ORAL_TABLET | Freq: Every day | ORAL | 0 refills | Status: DC

## 2020-05-30 NOTE — Progress Notes (Signed)
Cardiology Office Note   Date:  06/01/2020   ID:  TIMM BONENBERGER, DOB [birth year], MRN [id number]  PCP:  [facility]    No chief complaint on file.  CAD  Wt Readings from Last 3 Encounters:  06/01/20 214 lb 6.4 oz (97.3 kg)  03/24/19 209 lb (94.8 kg)  04/13/16 204 lb 3.2 oz (92.6 kg)       History of Present Illness: Robert Barrett is a 74 y.o. male   Who saw Dr. Donnie Aho in the past.   He has a h/o inferior MI in 2002. He had stents placed.  In 2008, he had CABG with LIMA to LAD and SVG to OM.  MI in the past was severe chest pressure in 2002.  Angina in 2008, sx were not as severe.  2017 stress test showed normal LV function and inferior ischemia, but he has been managed medically.   He has done well since that time: 8/19 TC 137, HDL <15, LDL n/a, TG 195.  He was diagnosed with OSA and wore CPAP.  He tries to be compliant.  CPAP is on recall now so he is without it.   Gym has closed due to COVID-19.  He walks a lot at work.   Denies : Chest pain. Dizziness. Leg edema. Nitroglycerin use. Orthopnea. Palpitations. Paroxysmal nocturnal dyspnea. Shortness of breath. Syncope.   He walks regularly.  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  He gets his blood work done at the [place], most recently in summer 2021.    He uses some light weights as well for exercise.    Past Medical History:  Diagnosis Date  . Anxiety   . CAD (coronary artery disease)   . GERD (gastroesophageal reflux disease)   . HLD (hyperlipidemia)   . Hypertension   . Insomnia   . Sleep apnea   . Type 2 diabetes mellitus (HCC)     Past Surgical History:  Procedure Laterality Date  . CARDIAC SURGERY    . CORONARY ARTERY BYPASS GRAFT       Current Outpatient Medications  Medication Sig Dispense Refill  . amLODipine (NORVASC) 10 MG tablet Take 10 mg by mouth daily.    Marland Kitchen artificial tears (LACRILUBE) OINT ophthalmic ointment Place into the right eye every 4 (four) hours as needed for  dry eyes. 3.5 g 0  . aspirin EC 81 MG tablet Take 1 tablet (81 mg total) by mouth daily. 90 tablet 3  . clonazePAM (KLONOPIN) 0.5 MG tablet Take 0.5 mg by mouth 2 (two) times daily as needed for anxiety.    Marland Kitchen etodolac (LODINE) 400 MG tablet Take 400 mg by mouth 2 (two) times daily.    . fenofibrate 160 MG tablet Take 1 tablet (160 mg total) by mouth daily. 30 tablet 0  . fluticasone (FLONASE) 50 MCG/ACT nasal spray Place 2 sprays into both nostrils at bedtime. 16 g 0  . HYDROcodone-acetaminophen (NORCO/VICODIN) 5-325 MG per tablet Take 1 tablet by mouth every 6 (six) hours as needed for moderate pain.    Marland Kitchen lisinopril (PRINIVIL,ZESTRIL) 40 MG tablet Take 40 mg by mouth daily.    . metoprolol (LOPRESSOR) 50 MG tablet Take 50 mg by mouth 2 (two) times daily.    Marland Kitchen omeprazole (PRILOSEC) 20 MG capsule Take 20 mg by mouth daily.    . rosuvastatin (CRESTOR) 10 MG tablet Take 10 mg by mouth daily.    . sildenafil (VIAGRA) 100 MG tablet Take 100  mg by mouth daily as needed for erectile dysfunction.    . temazepam (RESTORIL) 30 MG capsule Take 30 mg by mouth at bedtime as needed for sleep.     No current facility-administered medications for this visit.    Allergies:   Patient has no known allergies.    Social History:  The patient  reports that he has never smoked. He has never used smokeless tobacco. He reports current alcohol use. He reports that he does not use drugs.   Family History:  The patient's family history includes Cancer in his mother; Heart disease in his father.    ROS:  Please see the history of present illness.   Otherwise, review of systems are positive for out of CPAP due to recall.   All other systems are reviewed and negative.    PHYSICAL EXAM: VS:  BP 110/76   Pulse 63   Ht 5\' 10"  (1.778 m)   Wt 214 lb 6.4 oz (97.3 kg)   SpO2 96%   BMI 30.76 kg/m  , BMI Body mass index is 30.76 kg/m. GEN: Well nourished, well developed, in no acute distress  HEENT: normal  Neck: no  JVD, carotid bruits, or masses Cardiac: RRR, premature beats; no murmurs, rubs, or gallops,no edema  Respiratory:  clear to auscultation bilaterally, normal work of breathing GI: soft, nontender, nondistended, + BS MS: no deformity or atrophy  Skin: warm and dry, no rash Neuro:  Strength and sensation are intact Psych: euthymic mood, full affect   EKG:   The ekg ordered today demonstrates NSR, inferior T wave inversion, similar to prior   Recent Labs: No results found for requested labs within last 8760 hours.   Lipid Panel    Component Value Date/Time   CHOL  04/09/2007 0405    141        ATP III CLASSIFICATION:  <200     mg/dL   Desirable  04/11/2007  mg/dL   Borderline High  295-621    mg/dL   High   TRIG >=308 (H) 657 0405   HDL 25 (L) 04/09/2007 0405   CHOLHDL 5.6 04/09/2007 0405   VLDL 68 (H) 04/09/2007 0405   LDLCALC  04/09/2007 0405    48        Total Cholesterol/HDL:CHD Risk Coronary Heart Disease Risk Table                     Men   Women  1/2 Average Risk   3.4   3.3     Other studies Reviewed: Additional studies/ records that were reviewed today with results demonstrating: prior ECG reviewed.   ASSESSMENT AND PLAN:  1. CAD/old MI: No angina on medical therapy.  Continue aggressive secondary prevention.  2. HTN: The current medical regimen is effective;  continue present plan and medications. 3. OSA: Currently off of CPAP.  4. Hyperlipidemia: Await [facility] labs.  Whole food, plant based diet recommended.  5. Prediabetes: managed with diet controlled. High fiber diet discussed.  Avoid processed foods.   Current medicines are reviewed at length with the patient today.  The patient concerns regarding his medicines were addressed.  The following changes have been made:  No change  Labs/ tests ordered today include:  No orders of the defined types were placed in this encounter.   Recommend 150 minutes/week of aerobic exercise Low fat, low carb, high fiber  diet recommended  Disposition:   FU in 1 year   Signed, 04/11/2007  Eldridge Dace, MD  06/01/2020 9:42 AM    [facility] [address] [place], [place], [place]  [address] Phone: [phone]; Fax: [phone]

## 2020-06-01 ENCOUNTER — Ambulatory Visit (INDEPENDENT_AMBULATORY_CARE_PROVIDER_SITE_OTHER): Payer: Medicare Other | Admitting: Interventional Cardiology

## 2020-06-01 ENCOUNTER — Encounter: Payer: Self-pay | Admitting: Interventional Cardiology

## 2020-06-01 ENCOUNTER — Other Ambulatory Visit: Payer: Self-pay

## 2020-06-01 VITALS — BP 110/76 | HR 63 | Ht 70.0 in | Wt 214.4 lb

## 2020-06-01 DIAGNOSIS — I25118 Atherosclerotic heart disease of native coronary artery with other forms of angina pectoris: Secondary | ICD-10-CM

## 2020-06-01 DIAGNOSIS — I10 Essential (primary) hypertension: Secondary | ICD-10-CM

## 2020-06-01 DIAGNOSIS — E782 Mixed hyperlipidemia: Secondary | ICD-10-CM

## 2020-06-01 DIAGNOSIS — G4733 Obstructive sleep apnea (adult) (pediatric): Secondary | ICD-10-CM

## 2020-06-01 MED ORDER — FENOFIBRATE 160 MG PO TABS: 160.0000 mg | ORAL_TABLET | Freq: Every day | ORAL | 3 refills | Status: DC

## 2020-06-01 NOTE — Patient Instructions (Signed)
Medication Instructions:  Your physician recommends that you continue on your current medications as directed. Please refer to the Current Medication list given to you today.  *If you need a refill on your cardiac medications before your next appointment, please call your pharmacy*   Lab Work: None   If you have labs (blood work) drawn today and your tests are completely normal, you will receive your results only by: Marland Kitchen MyChart Message (if you have MyChart) OR . A paper copy in the mail If you have any lab test that is abnormal or we need to change your treatment, we will call you to review the results.   Testing/Procedures: None  Follow-Up: At [facility], you and your health needs are our priority.  As part of our continuing mission to provide you with exceptional heart care, we have created designated Provider Care Teams.  These Care Teams include your primary Cardiologist (physician) and Advanced Practice Providers (APPs -  Physician Assistants and Nurse Practitioners) who all work together to provide you with the care you need, when you need it.  We recommend signing up for the patient portal called "MyChart".  Sign up information is provided on this After Visit Summary.  MyChart is used to connect with patients for Virtual Visits (Telemedicine).  Patients are able to view lab/test results, encounter notes, upcoming appointments, etc.  Non-urgent messages can be sent to your provider as well.   To learn more about what you can do with MyChart, go to [email or web address].    Your next appointment:   12 month(s)  The format for your next appointment:   In Person  Provider:   You may see Everette Rank, MD or one of the following Advanced Practice Providers on your designated Care Team:    Ronie Spies, PA-C  Jacolyn Reedy, PA-C    Other Instructions Mail [place] a copy of your lab results   High-Fiber Diet Fiber, also called dietary fiber, is a type of carbohydrate that is  found in fruits, vegetables, whole grains, and beans. A high-fiber diet can have many health benefits. Your health care provider may recommend a high-fiber diet to help:  Prevent constipation. Fiber can make your bowel movements more regular.  Lower your cholesterol.  Relieve the following conditions: ? Swelling of veins in the anus (hemorrhoids). ? Swelling and irritation (inflammation) of specific areas of the digestive tract (uncomplicated diverticulosis). ? A problem of the large intestine (colon) that sometimes causes pain and diarrhea (irritable bowel syndrome, IBS).  Prevent overeating as part of a weight-loss plan.  Prevent heart disease, type 2 diabetes, and certain cancers. What is my plan? The recommended daily fiber intake in grams (g) includes:  38 g for men age 21 or younger.  30 g for men over age 13.  25 g for women age 44 or younger.  21 g for women over age 13. You can get the recommended daily intake of dietary fiber by:  Eating a variety of fruits, vegetables, grains, and beans.  Taking a fiber supplement, if it is not possible to get enough fiber through your diet. What do I need to know about a high-fiber diet?  It is better to get fiber through food sources rather than from fiber supplements. There is not a lot of research about how effective supplements are.  Always check the fiber content on the nutrition facts label of any prepackaged food. Look for foods that contain 5 g of fiber or more per serving.  Talk with a diet and nutrition specialist (dietitian) if you have questions about specific foods that are recommended or not recommended for your medical condition, especially if those foods are not listed below.  Gradually increase how much fiber you consume. If you increase your intake of dietary fiber too quickly, you may have bloating, cramping, or gas.  Drink plenty of water. Water helps you to digest fiber. What are tips for following this  plan?  Eat a wide variety of high-fiber foods.  Make sure that half of the grains that you eat each day are whole grains.  Eat breads and cereals that are made with whole-grain flour instead of refined flour or white flour.  Eat brown rice, bulgur wheat, or millet instead of white rice.  Start the day with a breakfast that is high in fiber, such as a cereal that contains 5 g of fiber or more per serving.  Use beans in place of meat in soups, salads, and pasta dishes.  Eat high-fiber snacks, such as berries, raw vegetables, nuts, and popcorn.  Choose whole fruits and vegetables instead of processed forms like juice or sauce. What foods can I eat?  Fruits Berries. Pears. Apples. Oranges. Avocado. Prunes and raisins. Dried figs. Vegetables Sweet potatoes. Spinach. Kale. Artichokes. Cabbage. Broccoli. Cauliflower. Green peas. Carrots. Squash. Grains Whole-grain breads. Multigrain cereal. Oats and oatmeal. Brown rice. Barley. Bulgur wheat. Millet. Quinoa. Bran muffins. Popcorn. Rye wafer crackers. Meats and other proteins Navy, kidney, and pinto beans. Soybeans. Split peas. Lentils. Nuts and seeds. Dairy Fiber-fortified yogurt. Beverages Fiber-fortified soy milk. Fiber-fortified orange juice. Other foods Fiber bars. The items listed above may not be a complete list of recommended foods and beverages. Contact a dietitian for more options. What foods are not recommended? Fruits Fruit juice. Cooked, strained fruit. Vegetables Fried potatoes. Canned vegetables. Well-cooked vegetables. Grains White bread. Pasta made with refined flour. White rice. Meats and other proteins Fatty cuts of meat. Fried chicken or fried fish. Dairy Milk. Yogurt. Cream cheese. Sour cream. Fats and oils Butters. Beverages Soft drinks. Other foods Cakes and pastries. The items listed above may not be a complete list of foods and beverages to avoid. Contact a dietitian for more  information. Summary  Fiber is a type of carbohydrate. It is found in fruits, vegetables, whole grains, and beans.  There are many health benefits of eating a high-fiber diet, such as preventing constipation, lowering blood cholesterol, helping with weight loss, and reducing your risk of heart disease, diabetes, and certain cancers.  Gradually increase your intake of fiber. Increasing too fast can result in cramping, bloating, and gas. Drink plenty of water while you increase your fiber.  The best sources of fiber include whole fruits and vegetables, whole grains, nuts, seeds, and beans. This information is not intended to replace advice given to you by your health care provider. Make sure you discuss any questions you have with your health care provider. Document Revised: 04/30/2017 Document Reviewed: 04/30/2017 Elsevier Patient Education  2020 [employer].

## 2021-01-19 LAB — BASIC METABOLIC PANEL
BUN: 6 (ref 4–21)
Chloride: 101 (ref 99–108)
Creatinine: 1 (ref 0.6–1.3)
Glucose: 146
Potassium: 5.6 — AB (ref 3.4–5.3)
Sodium: 137 (ref 137–147)

## 2021-01-19 LAB — VITAMIN D 25 HYDROXY (VIT D DEFICIENCY, FRACTURES): Vit D, 25-Hydroxy: 31

## 2021-01-19 LAB — LIPID PANEL
Cholesterol: 145 (ref 0–200)
Triglycerides: 236 — AB (ref 40–160)

## 2021-01-19 LAB — CBC AND DIFFERENTIAL
HCT: 46 (ref 41–53)
Platelets: 376 (ref 150–399)
WBC: 8.1

## 2021-01-19 LAB — COMPREHENSIVE METABOLIC PANEL: Calcium: 10.2 (ref 8.7–10.7)

## 2021-01-19 LAB — HEMOGLOBIN A1C: Hemoglobin A1C: 6.5

## 2021-02-25 ENCOUNTER — Encounter: Payer: Self-pay | Admitting: Nurse Practitioner

## 2021-03-01 DIAGNOSIS — I10 Essential (primary) hypertension: Secondary | ICD-10-CM

## 2021-03-01 DIAGNOSIS — I2510 Atherosclerotic heart disease of native coronary artery without angina pectoris: Secondary | ICD-10-CM

## 2021-03-01 DIAGNOSIS — G4733 Obstructive sleep apnea (adult) (pediatric): Secondary | ICD-10-CM

## 2021-03-01 DIAGNOSIS — F419 Anxiety disorder, unspecified: Secondary | ICD-10-CM | POA: Insufficient documentation

## 2021-03-01 DIAGNOSIS — K219 Gastro-esophageal reflux disease without esophagitis: Secondary | ICD-10-CM

## 2021-03-29 NOTE — Progress Notes (Signed)
03/29/2021 FARAAZ WOLIN [id number] Sep 22, 1945   CHIEF COMPLAINT: Discuss scheduling a colonoscopy   HISTORY OF PRESENT ILLNESS:  Robert Barrett is a 75 year old male with a past medical history of anxiety, coronary artery disease s/p MI 2002 s/p stent placement x 1 s/p 2 vessel CABG 2008, hypertension, hyperlipidemia, borderline DM II, sleep apnea uses CPAP and GERD. He presents to our office today as referred by Dr. Jalene Mullet at the [facility] clinic to schedule a colonoscopy.  The referral from the [place] documents a request to schedule a colonoscopy due to blood in stool (hematochezia), however, Mr. Merton Wadlow denies having any rectal bleeding/bloody stools. FOBT not done recently. He denies ever having a screening colonoscopy.  At the age of 25, he is not sure he wants to have a colonoscopy.  He has central abdominal pain if he eats peanuts otherwise no abdominal pain.  He is passing a normal formed brown bowel movement most days.  No rectal bleeding or black stools.  He has a history of acid reflux symptoms which is well controlled on omeprazole 20 mg daily which she has taken for at least 10 years.  No dysphagia, heartburn or upper abdominal pain.  He denies ever having an EGD.  He takes ASA 81 mg daily.  He also takes Lodine 400 mg twice daily for arthritis.  His appetite is good.  His weight is stable.  History of heart disease as noted above.  He was last seen by his cardiologist Dr. Eldridge Dace on 05/12/2020 and his cardiac status was stable at that time with recommendations to follow-up in 1 year.  He denies having any chest pain, palpitations, dizziness or shortness of breath.  No known family history of esophageal, gastric or colon cancer.  His wife is present.  CBC Latest Ref Rng & Units 01/19/2021 04/13/2016 04/15/2007  WBC - 8.1 6.4 11.6(H)  Hemoglobin 14.1 - 18.1 g/dL - 40.9 11.6(L)  Hematocrit 41 - 53 46 46.7 33.7(L)  Platelets 150 - 399 376 - 227    CMP Latest Ref Rng &  Units 01/19/2021 04/13/2016 04/14/2007  Glucose 65 - 99 mg/dL - 811(B) 147(W)  BUN 4 - 21 6 14 6   Creatinine 0.6 - 1.3 1.0 1.17 0.89  Sodium 137 - 147 137 138 137  Potassium 3.4 - 5.3 5.6(A) 4.8 4.0  Chloride 99 - 108 101 103 105  CO2 20 - 31 mmol/L - 25 26  Calcium 8.7 - 10.7 10.2 11.0(H) 8.8  Total Protein 6.1 - 8.1 g/dL - 7.2 -  Total Bilirubin 0.2 - 1.2 mg/dL - 0.8 -  Alkaline Phos 40 - 115 U/L - 44 -  AST 10 - 35 U/L - 46(H) -  ALT 9 - 46 U/L - 39 -     Past Medical History:  Diagnosis Date   Anxiety    CAD (coronary artery disease)    GERD (gastroesophageal reflux disease)    HLD (hyperlipidemia)    Hypertension    Insomnia    Sleep apnea    Type 2 diabetes mellitus (HCC)    Past Surgical History:  Procedure Laterality Date   CARDIAC SURGERY     CORONARY ARTERY BYPASS GRAFT     Social History: He is married. He has one daughter. He smoked cigarettes less than 1ppd x 20 - 25 years, quite 25+ years ago. He drinks 2 to 6 beers daily. CBD oil for aches and pains.   Family  History: Father with history of heart disease. Mother with history of breast cancer.  No known family history of esophageal, gastric or colon cancer.  No Known Allergies    Outpatient Encounter Medications as of 03/30/2021  Medication Sig   amLODipine (NORVASC) 10 MG tablet Take 10 mg by mouth daily.   artificial tears (LACRILUBE) OINT ophthalmic ointment Place into the right eye every 4 (four) hours as needed for dry eyes.   aspirin EC 81 MG tablet Take 1 tablet (81 mg total) by mouth daily.   clonazePAM (KLONOPIN) 0.5 MG tablet Take 0.5 mg by mouth 2 (two) times daily as needed for anxiety.   etodolac (LODINE) 400 MG tablet Take 400 mg by mouth 2 (two) times daily.   fenofibrate 160 MG tablet Take 1 tablet (160 mg total) by mouth daily.   fluticasone (FLONASE) 50 MCG/ACT nasal spray Place 2 sprays into both nostrils at bedtime.   HYDROcodone-acetaminophen (NORCO/VICODIN) 5-325 MG per tablet Take 1  tablet by mouth every 6 (six) hours as needed for moderate pain.   lisinopril (PRINIVIL,ZESTRIL) 40 MG tablet Take 40 mg by mouth daily.   metoprolol (LOPRESSOR) 50 MG tablet Take 50 mg by mouth 2 (two) times daily.   omeprazole (PRILOSEC) 20 MG capsule Take 20 mg by mouth daily.   rosuvastatin (CRESTOR) 10 MG tablet Take 10 mg by mouth daily.   sildenafil (VIAGRA) 100 MG tablet Take 100 mg by mouth daily as needed for erectile dysfunction.   temazepam (RESTORIL) 30 MG capsule Take 30 mg by mouth at bedtime as needed for sleep.   No facility-administered encounter medications on file as of 03/30/2021.    REVIEW OF SYSTEMS:  Gen: Denies fever, sweats or chills. No weight loss.  CV: Denies chest pain, palpitations or edema. Resp: Denies cough, shortness of breath of hemoptysis.  GI: See HPI.   GU : Denies urinary burning, blood in urine, increased urinary frequency or incontinence. MS: + Lower back pain.   Derm: Denies rash, itchiness, skin lesions or unhealing ulcers. Psych: + PTSD.  Heme: Denies bruising, bleeding. 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  Denies headaches, dizziness or paresthesias. Endo:  + Borderline DM.  PHYSICAL EXAM: BP 104/70   Pulse 76   Ht 5\' 10"  (1.778 m)   Wt 202 lb (91.6 kg)   BMI 28.98 kg/m   General: 75 year old male in no acute distress. Head: Normocephalic and atraumatic. Eyes:  Sclerae non-icteric, conjunctive pink. Ears: Normal auditory acuity. Mouth: Upper and lower dentures. No ulcers or lesions.  Neck: Supple, no lymphadenopathy or thyromegaly.  Lungs: Clear bilaterally to auscultation without wheezes, crackles or rhonchi. Heart: Regular rate and rhythm. No murmur, rub or gallop appreciated.  Abdomen: Soft, nontender, non distended. No masses. No hepatosplenomegaly. Normoactive bowel sounds x 4 quadrants.  Rectal: Deferred.  Musculoskeletal: Symmetrical with no gross deformities. Skin: Warm and dry. No rash or lesions on  visible extremities. Extremities: No edema. Neurological: Alert oriented x 4, no focal deficits.  Psychological:  Alert and cooperative. Normal mood and affect.  ASSESSMENT AND PLAN:  43) 75 year old male presents to discuss scheduling a screening colonoscopy.  Patient denies having any rectal bleeding/bloody stools. -Colonoscopy benefits and risks discussed including risk with sedation, risk of bleeding, perforation and infection  -Patient does not wish to purse a screening colonoscopy at the age of 60. He intends to follow up with his primary care physician to discuss Cologuard testing.  -Patient will  call our office if he decides to pursue a screening colonoscopy   2) History of GERD, stable on Omeprazole 20 mg daily  3) History of coronary artery disease, stable       CC:  [facility]

## 2021-03-30 ENCOUNTER — Encounter: Payer: Self-pay | Admitting: Nurse Practitioner

## 2021-03-30 ENCOUNTER — Ambulatory Visit (INDEPENDENT_AMBULATORY_CARE_PROVIDER_SITE_OTHER): Payer: No Typology Code available for payment source | Admitting: Nurse Practitioner

## 2021-03-30 VITALS — BP 104/70 | HR 76 | Ht 70.0 in | Wt 202.0 lb

## 2021-03-30 DIAGNOSIS — Z1211 Encounter for screening for malignant neoplasm of colon: Secondary | ICD-10-CM | POA: Diagnosis not present

## 2021-03-30 NOTE — Patient Instructions (Addendum)
If you are age 76 or older, your body mass index should be between 23-30. Your Body mass index is 28.98 kg/m. If this is out of the aforementioned range listed, please consider follow up with your Primary Care Provider.  The [place] [facility] providers would like to encourage you to use [facility] to communicate with providers for non-urgent requests or questions.  Due to long hold times on the telephone, sending your provider a message by [facility] may be faster and more efficient way to get a response. Please allow 48 business hours for a response.  Please remember that this is for non-urgent requests/questions.  It has been recommended to you by your physician that you have a(n) Colonoscopy completed. Per your request, we did not schedule the procedure(s) today. Please contact our office at [phone] should you decide to have the procedure completed. You will be scheduled for a pre-visit and procedure at that time.  It was great seeing you today! Thank you for entrusting me with your care and choosing [facility].  Arnaldo Natal, CRNP

## 2021-03-31 NOTE — Progress Notes (Signed)
____________________________________________________________  Attending physician addendum:  Thank you for sending this case to me. I have reviewed the entire note and agree with the plan.   Linkon Siverson Danis, MD  ____________________________________________________________  

## 2021-04-07 DIAGNOSIS — I10 Essential (primary) hypertension: Secondary | ICD-10-CM | POA: Diagnosis not present

## 2021-04-07 DIAGNOSIS — G4733 Obstructive sleep apnea (adult) (pediatric): Secondary | ICD-10-CM | POA: Diagnosis not present

## 2021-04-07 DIAGNOSIS — E781 Pure hyperglyceridemia: Secondary | ICD-10-CM | POA: Diagnosis not present

## 2021-04-07 DIAGNOSIS — H9193 Unspecified hearing loss, bilateral: Secondary | ICD-10-CM | POA: Diagnosis not present

## 2021-06-23 ENCOUNTER — Other Ambulatory Visit: Payer: Self-pay | Admitting: Interventional Cardiology

## 2021-10-20 ENCOUNTER — Inpatient Hospital Stay (HOSPITAL_COMMUNITY)
Admission: EM | Admit: 2021-10-20 | Discharge: 2021-11-04 | DRG: 329 | Disposition: A | Discharge status: Home Health | Payer: No Typology Code available for payment source | Attending: Internal Medicine | Admitting: Internal Medicine

## 2021-10-20 ENCOUNTER — Emergency Department (HOSPITAL_COMMUNITY): Payer: No Typology Code available for payment source

## 2021-10-20 ENCOUNTER — Encounter (HOSPITAL_COMMUNITY): Payer: Self-pay | Admitting: *Deleted

## 2021-10-20 ENCOUNTER — Other Ambulatory Visit: Payer: Self-pay

## 2021-10-20 DIAGNOSIS — I96 Gangrene, not elsewhere classified: Secondary | ICD-10-CM | POA: Diagnosis present

## 2021-10-20 DIAGNOSIS — E440 Moderate protein-calorie malnutrition: Secondary | ICD-10-CM | POA: Diagnosis present

## 2021-10-20 DIAGNOSIS — E860 Dehydration: Secondary | ICD-10-CM | POA: Diagnosis not present

## 2021-10-20 DIAGNOSIS — D62 Acute posthemorrhagic anemia: Secondary | ICD-10-CM | POA: Diagnosis not present

## 2021-10-20 DIAGNOSIS — K219 Gastro-esophageal reflux disease without esophagitis: Secondary | ICD-10-CM | POA: Diagnosis present

## 2021-10-20 DIAGNOSIS — J9811 Atelectasis: Secondary | ICD-10-CM | POA: Diagnosis not present

## 2021-10-20 DIAGNOSIS — K658 Other peritonitis: Secondary | ICD-10-CM | POA: Diagnosis present

## 2021-10-20 DIAGNOSIS — Z7982 Long term (current) use of aspirin: Secondary | ICD-10-CM | POA: Diagnosis not present

## 2021-10-20 DIAGNOSIS — E876 Hypokalemia: Secondary | ICD-10-CM | POA: Diagnosis not present

## 2021-10-20 DIAGNOSIS — E785 Hyperlipidemia, unspecified: Secondary | ICD-10-CM | POA: Diagnosis present

## 2021-10-20 DIAGNOSIS — I10 Essential (primary) hypertension: Secondary | ICD-10-CM | POA: Diagnosis present

## 2021-10-20 DIAGNOSIS — Z8249 Family history of ischemic heart disease and other diseases of the circulatory system: Secondary | ICD-10-CM

## 2021-10-20 DIAGNOSIS — K5720 Diverticulitis of large intestine with perforation and abscess without bleeding: Secondary | ICD-10-CM | POA: Diagnosis not present

## 2021-10-20 DIAGNOSIS — Z951 Presence of aortocoronary bypass graft: Secondary | ICD-10-CM | POA: Diagnosis not present

## 2021-10-20 DIAGNOSIS — K651 Peritoneal abscess: Secondary | ICD-10-CM | POA: Diagnosis present

## 2021-10-20 DIAGNOSIS — Z79899 Other long term (current) drug therapy: Secondary | ICD-10-CM

## 2021-10-20 DIAGNOSIS — E871 Hypo-osmolality and hyponatremia: Secondary | ICD-10-CM | POA: Diagnosis present

## 2021-10-20 DIAGNOSIS — E119 Type 2 diabetes mellitus without complications: Secondary | ICD-10-CM | POA: Diagnosis present

## 2021-10-20 DIAGNOSIS — R188 Other ascites: Secondary | ICD-10-CM | POA: Diagnosis present

## 2021-10-20 DIAGNOSIS — G4700 Insomnia, unspecified: Secondary | ICD-10-CM | POA: Diagnosis present

## 2021-10-20 DIAGNOSIS — I2510 Atherosclerotic heart disease of native coronary artery without angina pectoris: Secondary | ICD-10-CM | POA: Diagnosis present

## 2021-10-20 DIAGNOSIS — F419 Anxiety disorder, unspecified: Secondary | ICD-10-CM | POA: Diagnosis present

## 2021-10-20 DIAGNOSIS — G4733 Obstructive sleep apnea (adult) (pediatric): Secondary | ICD-10-CM | POA: Diagnosis present

## 2021-10-20 DIAGNOSIS — J9601 Acute respiratory failure with hypoxia: Secondary | ICD-10-CM | POA: Diagnosis not present

## 2021-10-20 DIAGNOSIS — K631 Perforation of intestine (nontraumatic): Secondary | ICD-10-CM | POA: Diagnosis not present

## 2021-10-20 DIAGNOSIS — K55049 Acute infarction of large intestine, extent unspecified: Secondary | ICD-10-CM | POA: Diagnosis not present

## 2021-10-20 DIAGNOSIS — Z91199 Patient's noncompliance with other medical treatment and regimen due to unspecified reason: Secondary | ICD-10-CM

## 2021-10-20 DIAGNOSIS — I9581 Postprocedural hypotension: Secondary | ICD-10-CM | POA: Diagnosis not present

## 2021-10-20 LAB — COMPREHENSIVE METABOLIC PANEL
ALT: 12 U/L (ref 0–44)
AST: 21 U/L (ref 15–41)
Albumin: 3.1 g/dL — ABNORMAL LOW (ref 3.5–5.0)
Alkaline Phosphatase: 56 U/L (ref 38–126)
Anion gap: 10 (ref 5–15)
BUN: 5 mg/dL — ABNORMAL LOW (ref 8–23)
CO2: 25 mmol/L (ref 22–32)
Calcium: 9.6 mg/dL (ref 8.9–10.3)
Chloride: 97 mmol/L — ABNORMAL LOW (ref 98–111)
Creatinine, Ser: 0.96 mg/dL (ref 0.61–1.24)
GFR, Estimated: >60 mL/min (ref >60)
Glucose, Bld: 153 mg/dL — ABNORMAL HIGH (ref 70–99)
Potassium: 3.8 mmol/L (ref 3.5–5.1)
Sodium: 132 mmol/L — ABNORMAL LOW (ref 135–145)
Total Bilirubin: 0.8 mg/dL (ref 0.3–1.2)
Total Protein: 7.5 g/dL (ref 6.5–8.1)

## 2021-10-20 LAB — CBC
HCT: 44.6 % (ref 39.0–52.0)
Hemoglobin: 14.4 g/dL (ref 13.0–17.0)
MCH: 29.8 pg (ref 26.0–34.0)
MCHC: 32.3 g/dL (ref 30.0–36.0)
MCV: 92.3 fL (ref 80.0–100.0)
Platelets: 688 10*3/uL — ABNORMAL HIGH (ref 150–400)
RBC: 4.83 MIL/uL (ref 4.22–5.81)
RDW: 13.2 % (ref 11.5–15.5)
WBC: 16.8 10*3/uL — ABNORMAL HIGH (ref 4.0–10.5)
nRBC: 0 % (ref 0.0–0.2)

## 2021-10-20 LAB — URINALYSIS, ROUTINE W REFLEX MICROSCOPIC
Bilirubin Urine: NEGATIVE
Glucose, UA: NEGATIVE mg/dL
Hgb urine dipstick: NEGATIVE
Ketones, ur: NEGATIVE mg/dL
Leukocytes,Ua: NEGATIVE
Nitrite: NEGATIVE
Protein, ur: NEGATIVE mg/dL
Specific Gravity, Urine: 1.012 (ref 1.005–1.030)
pH: 6 (ref 5.0–8.0)

## 2021-10-20 LAB — LIPASE, BLOOD: Lipase: 28 U/L (ref 11–51)

## 2021-10-20 MED ORDER — ONDANSETRON HCL 4 MG/2ML IJ SOLN
4.0000 mg | Freq: Four times a day (QID) | INTRAMUSCULAR | Status: DC | PRN
Start: 2021-10-20 — End: 2021-11-04
  Administered 2021-10-20: 4 mg via INTRAVENOUS
  Filled 2021-10-20: qty 2

## 2021-10-20 MED ORDER — ASPIRIN EC 81 MG PO TBEC
81.0000 mg | DELAYED_RELEASE_TABLET | Freq: Every day | ORAL | Status: DC
  Administered 2021-10-20 – 2021-10-26 (×7): 81 mg via ORAL
  Filled 2021-10-20 (×7): qty 1

## 2021-10-20 MED ORDER — CLONAZEPAM 0.5 MG PO TABS
0.5000 mg | ORAL_TABLET | Freq: Two times a day (BID) | ORAL | Status: DC | PRN
  Administered 2021-10-21 – 2021-10-31 (×5): 0.5 mg via ORAL
  Filled 2021-10-20 (×5): qty 1

## 2021-10-20 MED ORDER — FENOFIBRATE 160 MG PO TABS
160.0000 mg | ORAL_TABLET | Freq: Every day | ORAL | Status: DC
  Administered 2021-10-20 – 2021-10-26 (×7): 160 mg via ORAL
  Filled 2021-10-20 (×7): qty 1

## 2021-10-20 MED ORDER — FLUTICASONE PROPIONATE 50 MCG/ACT NA SUSP
2.0000 | Freq: Every day | NASAL | Status: DC
  Administered 2021-10-20 – 2021-11-03 (×13): 2 via NASAL
  Filled 2021-10-20: qty 16

## 2021-10-20 MED ORDER — METOPROLOL TARTRATE 50 MG PO TABS
50.0000 mg | ORAL_TABLET | Freq: Two times a day (BID) | ORAL | Status: DC
  Administered 2021-10-20 – 2021-10-21 (×2): 50 mg via ORAL
  Administered 2021-10-22: 25 mg via ORAL
  Administered 2021-10-22 – 2021-10-25 (×6): 50 mg via ORAL
  Filled 2021-10-20: qty 1
  Filled 2021-10-20: qty 2
  Filled 2021-10-20 (×2): qty 1
  Filled 2021-10-20: qty 2
  Filled 2021-10-20 (×6): qty 1

## 2021-10-20 MED ORDER — ALBUTEROL SULFATE (2.5 MG/3ML) 0.083% IN NEBU
2.5000 mg | INHALATION_SOLUTION | RESPIRATORY_TRACT | Status: DC | PRN
  Administered 2021-10-22: 2.5 mg via RESPIRATORY_TRACT
  Filled 2021-10-20: qty 3

## 2021-10-20 MED ORDER — INSULIN ASPART 100 UNIT/ML IJ SOLN
0.0000 [IU] | Freq: Three times a day (TID) | INTRAMUSCULAR | Status: DC
  Administered 2021-10-21: 1 [IU] via SUBCUTANEOUS
  Administered 2021-10-23: 3 [IU] via SUBCUTANEOUS
  Administered 2021-10-23 – 2021-10-25 (×4): 2 [IU] via SUBCUTANEOUS
  Administered 2021-10-26: 1 [IU] via SUBCUTANEOUS

## 2021-10-20 MED ORDER — POLYETHYLENE GLYCOL 3350 17 G PO PACK
17.0000 g | PACK | Freq: Every day | ORAL | Status: DC
  Administered 2021-10-22: 17 g via ORAL
  Filled 2021-10-20: qty 1

## 2021-10-20 MED ORDER — ACETAMINOPHEN 325 MG PO TABS
650.0000 mg | ORAL_TABLET | Freq: Four times a day (QID) | ORAL | Status: DC | PRN
  Administered 2021-10-20 – 2021-10-24 (×7): 650 mg via ORAL
  Filled 2021-10-20 (×7): qty 2

## 2021-10-20 MED ORDER — OXYCODONE HCL 5 MG PO TABS
5.0000 mg | ORAL_TABLET | ORAL | Status: DC | PRN
  Administered 2021-10-20 – 2021-11-03 (×42): 5 mg via ORAL
  Filled 2021-10-20 (×42): qty 1

## 2021-10-20 MED ORDER — MORPHINE SULFATE (PF) 2 MG/ML IV SOLN
1.0000 mg | INTRAVENOUS | Status: DC | PRN
  Administered 2021-10-20 – 2021-10-26 (×4): 1 mg via INTRAVENOUS
  Filled 2021-10-20 (×5): qty 1

## 2021-10-20 MED ORDER — LISINOPRIL 20 MG PO TABS
40.0000 mg | ORAL_TABLET | Freq: Every day | ORAL | Status: DC
  Administered 2021-10-20 – 2021-10-25 (×6): 40 mg via ORAL
  Filled 2021-10-20 (×6): qty 2

## 2021-10-20 MED ORDER — PANTOPRAZOLE SODIUM 40 MG PO TBEC
40.0000 mg | DELAYED_RELEASE_TABLET | Freq: Every day | ORAL | Status: DC
Start: 2021-10-20 — End: 2021-10-21
  Administered 2021-10-20 – 2021-10-21 (×2): 40 mg via ORAL
  Filled 2021-10-20 (×2): qty 1

## 2021-10-20 MED ORDER — AMLODIPINE BESYLATE 10 MG PO TABS
10.0000 mg | ORAL_TABLET | Freq: Every day | ORAL | Status: DC
  Administered 2021-10-20 – 2021-10-25 (×6): 10 mg via ORAL
  Filled 2021-10-20: qty 1
  Filled 2021-10-20: qty 2
  Filled 2021-10-20 (×4): qty 1
  Filled 2021-10-20: qty 2

## 2021-10-20 MED ORDER — TEMAZEPAM 15 MG PO CAPS
30.0000 mg | ORAL_CAPSULE | Freq: Every evening | ORAL | Status: DC | PRN
  Administered 2021-10-21 – 2021-11-03 (×13): 30 mg via ORAL
  Filled 2021-10-20 (×13): qty 2

## 2021-10-20 MED ORDER — SODIUM CHLORIDE 0.9 % IV SOLN: INTRAVENOUS | Status: AC

## 2021-10-20 MED ORDER — IOHEXOL 300 MG/ML  SOLN
100.0000 mL | Freq: Once | INTRAMUSCULAR | Status: AC | PRN
  Administered 2021-10-20: 100 mL via INTRAVENOUS

## 2021-10-20 MED ORDER — PIPERACILLIN-TAZOBACTAM 3.375 G IVPB
3.3750 g | Freq: Three times a day (TID) | INTRAVENOUS | Status: AC
  Administered 2021-10-20 – 2021-10-30 (×29): 3.375 g via INTRAVENOUS
  Filled 2021-10-20 (×33): qty 50

## 2021-10-20 MED ORDER — ACETAMINOPHEN 650 MG RE SUPP: 650.0000 mg | Freq: Four times a day (QID) | RECTAL | Status: DC | PRN

## 2021-10-20 MED ORDER — PIPERACILLIN-TAZOBACTAM 3.375 G IVPB 30 MIN
3.3750 g | Freq: Once | INTRAVENOUS | Status: AC
  Administered 2021-10-20: 3.375 g via INTRAVENOUS
  Filled 2021-10-20: qty 50

## 2021-10-20 MED ORDER — ROSUVASTATIN CALCIUM 5 MG PO TABS
10.0000 mg | ORAL_TABLET | Freq: Every day | ORAL | Status: DC
  Administered 2021-10-20 – 2021-10-26 (×7): 10 mg via ORAL
  Filled 2021-10-20 (×7): qty 2

## 2021-10-20 MED ORDER — ARTIFICIAL TEARS OPHTHALMIC OINT
TOPICAL_OINTMENT | OPHTHALMIC | Status: DC | PRN
  Filled 2021-10-20: qty 3.5

## 2021-10-20 MED ORDER — ONDANSETRON HCL 4 MG PO TABS: 4.0000 mg | ORAL_TABLET | Freq: Four times a day (QID) | ORAL | Status: DC | PRN

## 2021-10-20 NOTE — H&P (Addendum)
? ? ?    HISTORY AND PHYSICAL     ? ? ?PATIENT DETAILS ?Name: Robert Barrett ?Age: 76 y.o. ?Sex: male ?Date of Birth: [birth year] ?Admit Date: 10/20/2021 ?D[facility] ? ? ?Patient coming from: Home ? ? ?CHIEF COMPLAINT:  ?Abdominal pain x6-7 days ? ?HPI: ?Robert Barrett is a 76 y.o. male with medical history significant of DM-2, HTN, HLD, CAD s/p CABG approximately 10 years back-presented to the hospital with the above-noted complaints.  Per patient approximately a week or so ago-he started having lower abdominal pain.  Pain is colicky in nature-10/10 at its worst-associated with nausea.  He denies any fever.  Denies any diarrhea.  He has not had a bowel movement in 5 days but is passing flatus.  Since the pain was persistent-a presented to the ED today where a CT scan of his abdomen revealed perforated diverticulitis with a 6 cm abscess.  Hospitalist service was asked to admit this patient for further evaluation and treatment. ? ?Patient denies any headache, chest pain, shortness of breath. ? ?ED Course: General surgery consulted-started on IV Zosyn-TRH consulted for admission ? ?Note: ?Lives at: Home ?Mobility: Independent ?Chronic Indwelling Foley:no ? ? ?REVIEW OF SYSTEMS:  ?Constitutional:   ?No  weight loss, night sweats,  Fevers, chills, fatigue. ? ?HEENT:    ?No headaches, Dysphagia,Tooth/dental problems,Sore throat,  ?No sneezing, itching, ear ache, nasal congestion, post nasal drip ? ?Cardio-vascular: ?No chest pain,Orthopnea, PND,lower extremity edema, anasarca, palpitations ? ?GI:  ?No heartburn, indigestion,  vomiting, diarrhea, melena or hematochezia ? ?Resp: ?No shortness of breath, cough, hemoptysis,plueritic chest pain.  ? ?Skin:  ?No rash or lesions. ? ?GU:  ?No dysuria, change in color of urine, no urgency or frequency.  No flank pain. ? ?Musculoskeletal: ?No joint pain or swelling.  No decreased range of motion.  No back pain. ? ?Endocrine: ?No heat intolerance, no cold  intolerance, no polyuria, no polydipsia ? ?Psych: ?No change in mood or affect. No depression or anxiety.  No memory loss. ? ? ?ALLERGIES: ? No Known Allergies ? ?PAST MEDICAL HISTORY: ?Past Medical History:  ?Diagnosis Date  ? Anxiety   ? CAD (coronary artery disease)   ? GERD (gastroesophageal reflux disease)   ? HLD (hyperlipidemia)   ? Hypertension   ? Insomnia   ? Sleep apnea   ? Type 2 diabetes mellitus (HCC)   ? ? ?PAST SURGICAL HISTORY: ?Past Surgical History:  ?Procedure Laterality Date  ? CARDIAC SURGERY    ? CORONARY ARTERY BYPASS GRAFT    ? ? ?MEDICATIONS AT HOME: ?Prior to Admission medications   ?Medication Sig Start Date End Date Taking? Authorizing Provider  ?amLODipine (NORVASC) 10 MG tablet Take 10 mg by mouth daily.    [provider]  ?artificial tears (LACRILUBE) OINT ophthalmic ointment Place into the right eye every 4 (four) hours as needed for dry eyes. 04/13/16   Trena Platt D, PA  ?aspirin EC 81 MG tablet Take 1 tablet (81 mg total) by mouth daily. 03/24/19   Corky Crafts, MD  ?clonazePAM (KLONOPIN) 0.5 MG tablet Take 0.5 mg by mouth 2 (two) times daily as needed for anxiety.    [provider]  ?etodolac (LODINE) 400 MG tablet Take 400 mg by mouth 2 (two) times daily.    [provider]  ?fenofibrate 160 MG tablet TAKE 1 TABLET(160 MG) BY MOUTH DAILY 06/23/21   Corky Crafts, MD  ?fluticasone [facility] 50 MCG/ACT nasal spray Place 2 sprays  into both nostrils at bedtime. 09/24/14   Ozella Rocks, MD  ?HYDROcodone-acetaminophen (NORCO/VICODIN) 5-325 MG per tablet Take 1 tablet by mouth every 6 (six) hours as needed for moderate pain.    [provider]  ?lisinopril (PRINIVIL,ZESTRIL) 40 MG tablet Take 40 mg by mouth daily.    [provider]  ?metoprolol (LOPRESSOR) 50 MG tablet Take 50 mg by mouth 2 (two) times daily.    [provider]  ?omeprazole (PRILOSEC) 20 MG capsule Take 20 mg by mouth daily.    [provider]  ?rosuvastatin (CRESTOR) 10 MG tablet Take 10 mg by mouth daily.    [provider]  ?sildenafil (VIAGRA) 100 MG tablet Take 100 mg by mouth daily as needed for erectile dysfunction.    [provider]  ?temazepam (RESTORIL) 30 MG capsule Take 30 mg by mouth at bedtime as needed for sleep.    [provider]  ? ? ?FAMILY HISTORY: ?Family History  ?Problem Relation Age of Onset  ? Cancer Mother   ?     breast  ? Heart disease Father   ? ? ? ?SOCIAL HISTORY: ? reports that he has never smoked. He has never used smokeless tobacco. He reports current alcohol use. He reports that he does not use drugs. ? ?PHYSICAL EXAM: ?Blood pressure (!) 143/87, pulse 94, temperature 98.5 ?F (36.9 ?C), temperature source Oral, resp. rate 20, SpO2 97 %. ? ?General appearance :Awake, alert, not in any distress.  ?Eyes: pupils equally reactive to light and accomodation,no scleral icterus.Pink conjunctiva ?HEENT: Atraumatic and Normocephalic ?Neck: supple, no JVD.  ?Resp:Good air entry bilaterally, no added sounds  ?CVS: S1 S2 regular, no murmurs.  ?GI: Soft-some mild tenderness in the lower abdominal area without any peritoneal signs.  Bowel sounds present. ?Extremities: B/L Lower Ext shows no edema, both legs are warm to touch ?Neurology:  speech clear,Non focal, sensation is grossly intact. ?Psychiatric: Normal judgment and insight. Alert and oriented x 3. Normal mood. ?Musculoskeletal:No digital cyanosis ?Skin:No Rash, warm and dry ?Wounds:N/A ? ?LABS ON ADMISSION:  ?I have personally reviewed following labs and imaging studies ? ?CBC: ?Recent Labs  ?Lab 10/20/21 ?0852  ?WBC 16.8*  ?HGB 14.4  ?HCT 44.6  ?MCV 92.3  ?PLT 688*  ? ? ?Basic Metabolic Panel: ?Recent Labs  ?Lab 10/20/21 ?0852  ?NA 132*  ?K 3.8  ?CL 97*  ?CO2 25  ?GLUCOSE 153*  ?BUN 5*  ?CREATININE 0.96  ?CALCIUM 9.6  ? ? ?GFR: ?CrCl cannot be calculated (Unknown ideal weight.). ? ?Liver Function Tests: ?Recent Labs  ?Lab  10/20/21 ?0852  ?AST 21  ?ALT 12  ?ALKPHOS 56  ?BILITOT 0.8  ?PROT 7.5  ?ALBUMIN 3.1*  ? ?Recent Labs  ?Lab 10/20/21 ?0852  ?LIPASE 28  ? ?No results for input(s): AMMONIA in the last 168 hours. ? ?Coagulation Profile: ?No results for input(s): INR, PROTIME in the last 168 hours. ? ?Cardiac Enzymes: ?No results for input(s): CKTOTAL, CKMB, CKMBINDEX, TROPONINI in the last 168 hours. ? ?BNP (last 3 results) ?No results for input(s): PROBNP in the last 8760 hours. ? ?HbA1C: ?No results for input(s): HGBA1C in the last 72 hours. ? ?CBG: ?No results for input(s): GLUCAP in the last 168 hours. ? ?Lipid Profile: ?No results for input(s): CHOL, HDL, LDLCALC, TRIG, CHOLHDL, LDLDIRECT in the last 72 hours. ? ?Thyroid Function Tests: ?No results for input(s): TSH, T4TOTAL, FREET4, T3FREE, THYROIDAB in the last 72 hours. ? ?Anemia Panel: ?No results for input(s):  VITAMINB12, FOLATE, FERRITIN, TIBC, IRON, RETICCTPCT in the last 72 hours. ? ?Urine analysis: ?   ?Component Value Date/Time  ? COLORURINE YELLOW 10/20/2021 0855  ? APPEARANCEUR HAZY (A) 10/20/2021 0855  ? LABSPEC 1.012 10/20/2021 0855  ? PHURINE 6.0 10/20/2021 0855  ? GLUCOSEU NEGATIVE 10/20/2021 0855  ? HGBUR NEGATIVE 10/20/2021 0855  ? BILIRUBINUR NEGATIVE 10/20/2021 0855  ? KETONESUR NEGATIVE 10/20/2021 0855  ? PROTEINUR NEGATIVE 10/20/2021 0855  ? UROBILINOGEN 1.0 04/11/2007 1627  ? NITRITE NEGATIVE 10/20/2021 0855  ? LEUKOCYTESUR NEGATIVE 10/20/2021 0855  ? ? ?Sepsis Labs: ?Lactic Acid, Venous ?No results found for: LATICACIDVEN ? ? ?Microbiology: ?No results found for this or any previous visit (from the past 240 hour(s)).  ? ? ?RADIOLOGIC STUDIES ON ADMISSION: ?CT ABDOMEN PELVIS W CONTRAST ? ?Result Date: 10/20/2021 ?CLINICAL DATA:  Acute abdominal pain EXAM: CT ABDOMEN AND PELVIS WITH CONTRAST TECHNIQUE: Multidetector CT imaging of the abdomen and pelvis was performed using the standard protocol following bolus administration of intravenous contrast. RADIATION  DOSE REDUCTION: This exam was performed according to the departmental dose-optimization program which includes automated exposure control, adjustment of the mA and/or kV according to patient size and/or use of iterative rec

## 2021-10-20 NOTE — ED Notes (Signed)
Admitting MD/surgery at [facility] ?

## 2021-10-20 NOTE — ED Triage Notes (Signed)
Pt reports ongoing abd pain that has been getting more severe. Unsure of last BM, estimates 6 days ago. Denies urinary symptoms. Reports recent n/v, lack of appetite.  ?

## 2021-10-20 NOTE — Progress Notes (Signed)
Pharmacy Antibiotic Note ? ?Robert Barrett is a 76 y.o. male admitted on 10/20/2021 with  intra-abdominal infection .  Pharmacy has been consulted for Zosyn dosing. ? ?WBC elevated, SCr wnl ? ?Plan: ?-Zosyn 3.375 gm IV Q 8 hours (EI infusion) ?-Monitor CBC, renal fx, cultures and clinical progress ? ?  ? ?Temp (24hrs), Avg:98.6 ?F (37 ?C), Min:98.5 ?F (36.9 ?C), Max:98.7 ?F (37.1 ?C) ? ?Recent Labs  ?Lab 10/20/21 ?[id number]  ?WBC 16.8*  ?CREATININE 0.96  ?  ?CrCl cannot be calculated (Unknown ideal weight.).   ? ?No Known Allergies ? ?Antimicrobials this admission: ?Zosyn 4/13 >>  ? ?Dose adjustments this admission: ? ? ?Microbiology results: ? ? ?Thank you for allowing pharmacy to be a part of this patient?s care. ? ?Albertina Parr, PharmD., BCCCP ?Clinical Pharmacist ?Please refer to AMION for unit-specific pharmacist  ? ? ?

## 2021-10-20 NOTE — ED Provider Triage Note (Addendum)
Emergency Medicine Provider Triage Evaluation Note ? ?Robert Barrett , a 76 y.o. male  was evaluated in triage.  ? ?Critical call reading from [facility] Radiology. Perforated diverticulitis with abscess measuring 6x3cm call from Dr. Dorothey Baseman. ? ?[employer] alerted.  ? ?General surgery paged.  ? ?Addendum: Spoke with Dr. Sophronia Simas from general surgery who will see the patient on consult rounds tomorrow.  Recommended starting Zosyn.  Spoke with Dr. Freida Busman in the back who will see the patient and admit to medicine. ? ?  ?Achille Rich, PA-C ?10/20/21 1649 ? ?  ?Achille Rich, PA-C ?10/20/21 1649 ? ?  ?Achille Rich, PA-C ?10/20/21 1715 ? ?

## 2021-10-20 NOTE — ED Provider Notes (Signed)
?[facility] ?Provider Note ? ? ?CSN: [id number] ?Arrival date & time: 10/20/21  [id number] ? ?  ? ?History ? ?Chief Complaint  ?Patient presents with  ? Abdominal Pain  ? ? ?Robert Barrett is a 76 y.o. male. ? ?76 year old patient presents with left lower quadrant abdominal pain x3 weeks.  Pain has been colicky but worse recently.  Denies any urinary symptoms.  No fever or chills.  States that he had trouble passing flatus.  No bloody stools.  No prior history of same.  No treatment use prior to arrival ? ? ?  ? ?Home Medications ?Prior to Admission medications   ?Medication Sig Start Date End Date Taking? Authorizing Provider  ?amLODipine (NORVASC) 10 MG tablet Take 10 mg by mouth daily.    [provider]  ?artificial tears (LACRILUBE) OINT ophthalmic ointment Place into the right eye every 4 (four) hours as needed for dry eyes. 04/13/16   Trena PlattEnglish, Stephanie D, PA  ?aspirin EC 81 MG tablet Take 1 tablet (81 mg total) by mouth daily. 03/24/19   Corky CraftsVaranasi, Jayadeep S, MD  ?clonazePAM (KLONOPIN) 0.5 MG tablet Take 0.5 mg by mouth 2 (two) times daily as needed for anxiety.    [provider]  ?etodolac (LODINE) 400 MG tablet Take 400 mg by mouth 2 (two) times daily.    [provider]  ?fenofibrate 160 MG tablet TAKE 1 TABLET(160 MG) BY MOUTH DAILY 06/23/21   Corky CraftsVaranasi, Jayadeep S, MD  ?fluticasone [facility](FLONASE) 50 MCG/ACT nasal spray Place 2 sprays into both nostrils at bedtime. 09/24/14   Ozella RocksMerrell, David J, MD  ?HYDROcodone-acetaminophen (NORCO/VICODIN) 5-325 MG per tablet Take 1 tablet by mouth every 6 (six) hours as needed for moderate pain.    [provider]  ?lisinopril (PRINIVIL,ZESTRIL) 40 MG tablet Take 40 mg by mouth daily.    [provider]  ?metoprolol (LOPRESSOR) 50 MG tablet Take 50 mg by mouth 2 (two) times daily.    [provider]  ?omeprazole (PRILOSEC) 20 MG capsule Take 20 mg by mouth daily.    [provider]   ?rosuvastatin (CRESTOR) 10 MG tablet Take 10 mg by mouth daily.    [provider]  ?sildenafil (VIAGRA) 100 MG tablet Take 100 mg by mouth daily as needed for erectile dysfunction.    [provider]  ?temazepam (RESTORIL) 30 MG capsule Take 30 mg by mouth at bedtime as needed for sleep.    [provider]  ?   ? ?Allergies    ?Patient has no known allergies.   ? ?Review of Systems   ?Review of Systems  ?All other systems reviewed and are negative. ? ?Physical Exam ?Updated Vital Signs ?BP 116/84   Pulse 88   Temp 98.5 ?F (36.9 ?C) (Oral)   Resp 18   SpO2 97%  ?Physical Exam ?Vitals and nursing note reviewed.  ?Constitutional:   ?   General: He is not in acute distress. ?   Appearance: Normal appearance. He is well-developed. He is not toxic-appearing.  ?HENT:  ?   Head: Normocephalic and atraumatic.  ?Eyes:  ?   General: Lids are normal.  ?   Conjunctiva/sclera: Conjunctivae normal.  ?   Pupils: Pupils are equal, round, and reactive to light.  ?Neck:  ?   Thyroid: No thyroid mass.  ?   Trachea: No tracheal deviation.  ?Cardiovascular:  ?   Rate and Rhythm: Normal rate and regular rhythm.  ?   Heart  sounds: Normal heart sounds. No murmur heard. ?  No gallop.  ?Pulmonary:  ?   Effort: Pulmonary effort is normal. No respiratory distress.  ?   Breath sounds: Normal breath sounds. No stridor. No decreased breath sounds, wheezing, rhonchi or rales.  ?Abdominal:  ?   General: There is no distension.  ?   Palpations: Abdomen is soft.  ?   Tenderness: There is abdominal tenderness in the left lower quadrant. There is no rebound.  ? ? ?Musculoskeletal:     ?   General: No tenderness. Normal range of motion.  ?   Cervical back: Normal range of motion and neck supple.  ?Skin: ?   General: Skin is warm and dry.  ?   Findings: No abrasion or rash.  ?Neurological:  ?   Mental Status: He is alert and oriented to person, place, and time. Mental status is at baseline.  ?   GCS: GCS eye subscore is 4.  GCS verbal subscore is 5. GCS motor subscore is 6.  ?   Cranial Nerves: No cranial nerve deficit.  ?   Sensory: No sensory deficit.  ?   Motor: Motor function is intact.  ?Psychiatric:     ?   Attention and Perception: Attention normal.     ?   Speech: Speech normal.     ?   Behavior: Behavior normal.  ? ? ?ED Results / Procedures / Treatments   ?Labs ?(all labs ordered are listed, but only abnormal results are displayed) ?Labs Reviewed  ?COMPREHENSIVE METABOLIC PANEL - Abnormal; Notable for the following components:  ?    Result Value  ? Sodium 132 (*)   ? Chloride 97 (*)   ? Glucose, Bld 153 (*)   ? BUN 5 (*)   ? Albumin 3.1 (*)   ? All other components within normal limits  ?CBC - Abnormal; Notable for the following components:  ? WBC 16.8 (*)   ? Platelets 688 (*)   ? All other components within normal limits  ?URINALYSIS, ROUTINE W REFLEX MICROSCOPIC - Abnormal; Notable for the following components:  ? APPearance HAZY (*)   ? All other components within normal limits  ?LIPASE, BLOOD  ? ? ?EKG ?None ? ?Radiology ?CT ABDOMEN PELVIS W CONTRAST ? ?Result Date: 10/20/2021 ?CLINICAL DATA:  Acute abdominal pain EXAM: CT ABDOMEN AND PELVIS WITH CONTRAST TECHNIQUE: Multidetector CT imaging of the abdomen and pelvis was performed using the standard protocol following bolus administration of intravenous contrast. RADIATION DOSE REDUCTION: This exam was performed according to the departmental dose-optimization program which includes automated exposure control, adjustment of the mA and/or kV according to patient size and/or use of iterative reconstruction technique. CONTRAST:  OMNIPAQUE IOHEXOL 300 MG/ML  SOLN COMPARISON:  None. FINDINGS: Lower chest: Small hiatal hernia. Bibasilar atelectasis. Coronary artery calcifications. Hepatobiliary: No focal liver abnormality is seen. No gallstones, gallbladder wall thickening, or biliary dilatation. Pancreas: Unremarkable. No pancreatic ductal dilatation or surrounding  inflammatory changes. Spleen: Normal in size without focal abnormality. Adrenals/Urinary Tract: Bilateral adrenal glands are unremarkable. Kidneys enhance symmetrically with no evidence of hydronephrosis or nephrolithiasis. Bilateral low-attenuation renal lesions, largest are compatible with simple cysts, others are too small to completely characterize, no further follow-up imaging is recommended for these lesions. Stomach/Bowel: Marked wall thickening of the sigmoid colon with severe diverticular disease and a large amount of inflammatory change involving the left lower quadrant. Rim enhancing fluid collection of the mid lower abdomen measuring approximately 6.0 x 3.0 cm  with fistulous track extending from the wall of the sigmoid colon. Upstream distended loops of large bowel. Inflammatory soft tissue extends to the bladder dome with reactive wall thickening of the bladder dome. No evidence of colovesicular fistula. Vascular/Lymphatic: Aortic atherosclerosis. No enlarged abdominal or pelvic lymph nodes. Reproductive: Marked prostatomegaly, measuring up to 6.0 cm. Other: No abdominal wall hernia or abnormality. No abdominopelvic ascites. Musculoskeletal: No acute or significant osseous findings. IMPRESSION: 1. Findings compatible with perforated diverticulitis. Rim enhancing fluid collection of the mid lower abdomen measuring approximately 6.0 x 3.0 cm, compatible with abscess. 2. Upstream distension of the large bowel, concerning for secondary large bowel obstruction. 3. Inflammatory soft tissue extends to the bladder dome with reactive wall thickening of the bladder dome. No evidence of colovesicular fistula. 4. Coronary artery calcifications and aortic Atherosclerosis (ICD10-I70.0). Critical Value/emergent results were called by telephone at the time of interpretation on 10/20/2021 at 4:37 pm to provider Achille Rich , who verbally acknowledged these results. Electronically Signed   By: Allegra Lai M.D.   On:  10/20/2021 16:43   ? ?Procedures ?Procedures  ? ? ?Medications Ordered in ED ?Medications  ?piperacillin-tazobactam (ZOSYN) IVPB 3.375 g (3.375 g Intravenous New Bag/Given 10/20/21 1714)  ?iohexol (OMNIPAQUE) 3

## 2021-10-20 NOTE — Consult Note (Signed)
? ?Consulting Physician: Hyman Hopes Stanislaus Kaltenbach ? ?Referring Provider: Dr. Freida Busman - ER provider ? ?Chief Complaint: Abdominal Pain ? ?Reason for Consult: Complicated diverticulitis ? ? ?Subjective  ? ?HPI: ?Robert Barrett is an 76 y.o. male who is here for abdominal pain.  The pain has been in the lower abdomen.  Every 4 minutes or so, he experiences crampy lower abdominal pain when his belly rumbles.  The pain has been going on for about a week.  He feels better after some pain medication in the emergency department.  He hasn't experienced any nausea or vomiting.   ? ?He tried to but has never had a colonoscopy in the past.  [facility] he was scheduled to have one so that was canceled.  Then, when he had a consult for another at [facility], he didn't realize he was supposed to call the office to set up the procedure, and by the time he called, the 6 month window from the [place] was up so he had to restart the process.   ? ?So he's never had a colonoscopy.  Also has never had an abdominal surgery. ? ?Past Medical History:  ?Diagnosis Date  ? Anxiety   ? CAD (coronary artery disease)   ? GERD (gastroesophageal reflux disease)   ? HLD (hyperlipidemia)   ? Hypertension   ? Insomnia   ? Sleep apnea   ? Type 2 diabetes mellitus (HCC)   ? ? ?Past Surgical History:  ?Procedure Laterality Date  ? CARDIAC SURGERY    ? CORONARY ARTERY BYPASS GRAFT    ? ? ?Family History  ?Problem Relation Age of Onset  ? Cancer Mother   ?     breast  ? Heart disease Father   ? ? ?Social:  reports that he has never smoked. He has never used smokeless tobacco. He reports current alcohol use. He reports that he does not use drugs. ? ?Allergies: No Known Allergies ? ?Medications: ?Current Outpatient Medications  ?Medication Instructions  ? amLODipine (NORVASC) 10 mg, Oral, Every evening  ? artificial tears (LACRILUBE) OINT ophthalmic ointment Right Eye, Every 4 hours PRN  ? aspirin EC 81 mg, Oral, Daily  ?  clonazePAM (KLONOPIN) 0.5 mg, 2 times daily PRN  ? fenofibrate 160 MG tablet TAKE 1 TABLET(160 MG) BY MOUTH DAILY  ? fluticasone (FLONASE) 50 MCG/ACT nasal spray 2 sprays, Each Nare, Daily at bedtime  ? HYDROcodone-acetaminophen (NORCO/VICODIN) 5-325 MG per tablet 1 tablet, Every 6 hours PRN  ? lisinopril (ZESTRIL) 40 mg, Daily  ? metoprolol tartrate (LOPRESSOR) 50 mg, 2 times daily  ? Multiple Vitamins-Minerals (MULTIVITAMIN GUMMIES MENS) CHEW 1 tablet, Oral, Every evening  ? omeprazole (PRILOSEC) 20 mg, Daily  ? rosuvastatin (CRESTOR) 10 mg, Daily  ? sildenafil (VIAGRA) 100 mg, Daily PRN  ? temazepam (RESTORIL) 30 mg, Oral, Daily at bedtime  ? ? ?ROS - all of the below systems have been reviewed with the patient and positives are indicated with bold text ?General: chills, fever or night sweats ?Eyes: blurry vision or double vision ?ENT: epistaxis or sore throat ?Allergy/Immunology: itchy/watery eyes or nasal congestion ?Hematologic/Lymphatic: bleeding problems, blood clots or swollen lymph nodes ?Endocrine: temperature intolerance or unexpected weight changes ?Breast: new or changing breast lumps or nipple discharge ?Resp: cough, shortness of breath, or wheezing ?CV: chest pain or dyspnea on exertion ?GI: as per HPI ?GU: dysuria, trouble voiding, or hematuria ?MSK: joint pain or joint stiffness ?Neuro: TIA or  stroke symptoms ?Derm: pruritus and skin lesion changes ?Psych: anxiety and depression ? ?Objective  ? ?PE ?Blood pressure 126/83, pulse 76, temperature 98.5 ?F (36.9 ?C), temperature source Oral, resp. rate 20, SpO2 96 %. ?Constitutional: NAD; conversant; no deformities ?Eyes: Moist conjunctiva; no lid lag; anicteric; PERRL ?Neck: Trachea midline; no thyromegaly ?Lungs: Normal respiratory effort; no tactile fremitus ?CV: RRR; no palpable thrills; no pitting edema ?GI: Abd Soft, relatively non-tender, some distention; no palpable hepatosplenomegaly ?MSK: Normal range of motion of extremities; no  clubbing/cyanosis ?Psychiatric: Appropriate affect; alert and oriented x3 ?Lymphatic: No palpable cervical or axillary lymphadenopathy ? ?Results for orders placed or performed during the hospital encounter of 10/20/21 (from the past 24 hour(s))  ?Lipase, blood     Status: None  ? Collection Time: 10/20/21  8:52 AM  ?Result Value Ref Range  ? Lipase 28 11 - 51 U/L  ?Comprehensive metabolic panel     Status: Abnormal  ? Collection Time: 10/20/21  8:52 AM  ?Result Value Ref Range  ? Sodium 132 (L) 135 - 145 mmol/L  ? Potassium 3.8 3.5 - 5.1 mmol/L  ? Chloride 97 (L) 98 - 111 mmol/L  ? CO2 25 22 - 32 mmol/L  ? Glucose, Bld 153 (H) 70 - 99 mg/dL  ? BUN 5 (L) 8 - 23 mg/dL  ? Creatinine, Ser 0.96 0.61 - 1.24 mg/dL  ? Calcium 9.6 8.9 - 10.3 mg/dL  ? Total Protein 7.5 6.5 - 8.1 g/dL  ? Albumin 3.1 (L) 3.5 - 5.0 g/dL  ? AST 21 15 - 41 U/L  ? ALT 12 0 - 44 U/L  ? Alkaline Phosphatase 56 38 - 126 U/L  ? Total Bilirubin 0.8 0.3 - 1.2 mg/dL  ? GFR, Estimated >60 >60 mL/min  ? Anion gap 10 5 - 15  ?CBC     Status: Abnormal  ? Collection Time: 10/20/21  8:52 AM  ?Result Value Ref Range  ? WBC 16.8 (H) 4.0 - 10.5 K/uL  ? RBC 4.83 4.22 - 5.81 MIL/uL  ? Hemoglobin 14.4 13.0 - 17.0 g/dL  ? HCT 44.6 39.0 - 52.0 %  ? MCV 92.3 80.0 - 100.0 fL  ? MCH 29.8 26.0 - 34.0 pg  ? MCHC 32.3 30.0 - 36.0 g/dL  ? RDW 13.2 11.5 - 15.5 %  ? Platelets 688 (H) 150 - 400 K/uL  ? nRBC 0.0 0.0 - 0.2 %  ?Urinalysis, Routine w reflex microscopic     Status: Abnormal  ? Collection Time: 10/20/21  8:55 AM  ?Result Value Ref Range  ? Color, Urine YELLOW YELLOW  ? APPearance HAZY (A) CLEAR  ? Specific Gravity, Urine 1.012 1.005 - 1.030  ? pH 6.0 5.0 - 8.0  ? Glucose, UA NEGATIVE NEGATIVE mg/dL  ? Hgb urine dipstick NEGATIVE NEGATIVE  ? Bilirubin Urine NEGATIVE NEGATIVE  ? Ketones, ur NEGATIVE NEGATIVE mg/dL  ? Protein, ur NEGATIVE NEGATIVE mg/dL  ? Nitrite NEGATIVE NEGATIVE  ? Leukocytes,Ua NEGATIVE NEGATIVE  ? ? ? ?Imaging Orders    ?     CT ABDOMEN PELVIS W  CONTRAST    ?1. Findings compatible with perforated diverticulitis. Rim enhancing ?fluid collection of the mid lower abdomen measuring approximately ?6.0 x 3.0 cm, compatible with abscess. ?2. Upstream distension of the large bowel, concerning for secondary ?large bowel obstruction. ?3. Inflammatory soft tissue extends to the bladder dome with ?reactive wall thickening of the bladder dome. No evidence of ?colovesicular fistula. ?4. Coronary artery calcifications and aortic Atherosclerosis ?(  ICD10-I70.0). ?  ? ?Assessment and Plan  ? ?Robert Barrett is an 76 y.o. male with abdominal pain found to have perforated diverticulitis with pericolonic 6 cm abscess.  Interventional radiology was consulted by the medical team and this seems like a reasonable plan based on his clinical presentation.  The surgery team will follow his progress, hopefully he improves with these measures.  He will eventually need a colonoscopy.  We will also have to discuss the option of a colectomy in the future and try make a well informed personalized decision with the patient.  I introduced this discussion tonight. ? ?Quentin Ore, MD ? ?[facility] ?Use [email or web address] to contact on call provider ? ?New Patient Billing: ?[phone] - High MDM ? ?

## 2021-10-21 DIAGNOSIS — F419 Anxiety disorder, unspecified: Secondary | ICD-10-CM

## 2021-10-21 DIAGNOSIS — I2510 Atherosclerotic heart disease of native coronary artery without angina pectoris: Secondary | ICD-10-CM | POA: Diagnosis not present

## 2021-10-21 DIAGNOSIS — K5720 Diverticulitis of large intestine with perforation and abscess without bleeding: Secondary | ICD-10-CM | POA: Diagnosis not present

## 2021-10-21 DIAGNOSIS — I10 Essential (primary) hypertension: Secondary | ICD-10-CM | POA: Diagnosis not present

## 2021-10-21 LAB — HEMOGLOBIN A1C
Hgb A1c MFr Bld: 6 % — ABNORMAL HIGH (ref 4.8–5.6)
Mean Plasma Glucose: 125.5 mg/dL

## 2021-10-21 LAB — CBC
HCT: 40.4 % (ref 39.0–52.0)
Hemoglobin: 13.3 g/dL (ref 13.0–17.0)
MCH: 30.1 pg (ref 26.0–34.0)
MCHC: 32.9 g/dL (ref 30.0–36.0)
MCV: 91.4 fL (ref 80.0–100.0)
Platelets: 614 10*3/uL — ABNORMAL HIGH (ref 150–400)
RBC: 4.42 MIL/uL (ref 4.22–5.81)
RDW: 13.3 % (ref 11.5–15.5)
WBC: 15.5 10*3/uL — ABNORMAL HIGH (ref 4.0–10.5)
nRBC: 0 % (ref 0.0–0.2)

## 2021-10-21 LAB — GLUCOSE, CAPILLARY
Glucose-Capillary: 116 mg/dL — ABNORMAL HIGH (ref 70–99)
Glucose-Capillary: 117 mg/dL — ABNORMAL HIGH (ref 70–99)

## 2021-10-21 LAB — CBG MONITORING, ED: Glucose-Capillary: 132 mg/dL — ABNORMAL HIGH (ref 70–99)

## 2021-10-21 LAB — COMPREHENSIVE METABOLIC PANEL
ALT: 8 U/L (ref 0–44)
AST: 18 U/L (ref 15–41)
Albumin: 2.6 g/dL — ABNORMAL LOW (ref 3.5–5.0)
Alkaline Phosphatase: 51 U/L (ref 38–126)
Anion gap: 6 (ref 5–15)
BUN: 8 mg/dL (ref 8–23)
CO2: 23 mmol/L (ref 22–32)
Calcium: 8.7 mg/dL — ABNORMAL LOW (ref 8.9–10.3)
Chloride: 100 mmol/L (ref 98–111)
Creatinine, Ser: 0.79 mg/dL (ref 0.61–1.24)
GFR, Estimated: >60 mL/min (ref >60)
Glucose, Bld: 131 mg/dL — ABNORMAL HIGH (ref 70–99)
Potassium: 3.7 mmol/L (ref 3.5–5.1)
Sodium: 129 mmol/L — ABNORMAL LOW (ref 135–145)
Total Bilirubin: 0.6 mg/dL (ref 0.3–1.2)
Total Protein: 6.2 g/dL — ABNORMAL LOW (ref 6.5–8.1)

## 2021-10-21 MED ORDER — ENOXAPARIN SODIUM 40 MG/0.4ML IJ SOSY
40.0000 mg | PREFILLED_SYRINGE | INTRAMUSCULAR | Status: DC
Start: 2021-10-21 — End: 2021-11-04
  Administered 2021-10-21 – 2021-11-03 (×14): 40 mg via SUBCUTANEOUS
  Filled 2021-10-21 (×15): qty 0.4

## 2021-10-21 MED ORDER — PANTOPRAZOLE SODIUM 40 MG PO TBEC
40.0000 mg | DELAYED_RELEASE_TABLET | Freq: Two times a day (BID) | ORAL | Status: DC
Start: 2021-10-21 — End: 2021-10-21

## 2021-10-21 MED ORDER — PANTOPRAZOLE SODIUM 40 MG PO TBEC
40.0000 mg | DELAYED_RELEASE_TABLET | Freq: Two times a day (BID) | ORAL | Status: DC
  Administered 2021-10-22 – 2021-10-25 (×7): 40 mg via ORAL
  Filled 2021-10-21 (×7): qty 1

## 2021-10-21 NOTE — ED Notes (Signed)
Pt care taken, no complaints ?

## 2021-10-21 NOTE — Progress Notes (Addendum)
?      ?                 PROGRESS NOTE ? ?      ?PATIENT DETAILS ?Name: Robert Barrett ?Age: 76 y.o. ?Sex: male ?Date of Birth: 12/03/1945 ?Admit Date: 10/20/2021 ?Admitting Physician Maretta BeesShanker M Alahna Dunne, MD ?[name] is a 76 y.o.  male with past medical history of DM-2, HTN, HLD, CAD s/p CABG-presented with abdominal pain-found to have perforated diverticulitis with abscess. ? ? ?Significant events: ?[facility]13>> [facility] with 6 cm abscess. ? ?Significant studies: ?4/13>> CT abdomen/pelvis: Perforated diverticulitis-6 cm abscess.  Upstream distention of large bowel-concerning for secondary large bowel obstruction. ? ?Significant microbiology data: ?None ? ?Procedures: ?None ? ?Consults: ?General surgery ? ?Subjective: ?Lying comfortably in bed-denies any chest pain or shortness of breath.  No BM but thinks he may have passed some flatus. ? ?Objective: ?Vitals: ?Blood pressure (!) 97/55, pulse 75, temperature 98.2 ?F (36.8 ?C), resp. rate 18, SpO2 97 %.  ? ?Exam: ?Gen Exam:Alert awake-not in any distress ?HEENT:atraumatic, normocephalic ?Chest: B/L clear to auscultation anteriorly ?CVS:S1S2 regular ?Abdomen: Soft-mild tenderness in the right lower abdomen-minimal distention.  No peritoneal signs. ?Extremities:no edema ?Neurology: Non focal ?Skin: no rash ? ?Pertinent Labs/Radiology: ? ?  Latest Ref Rng & Units 10/21/2021  ?  3:20 AM 10/20/2021  ?  8:52 AM 01/19/2021  ? 12:00 AM  ?CBC  ?WBC 4.0 - 10.5 K/uL 15.5   16.8   8.1       ?Hemoglobin 13.0 - 17.0 g/dL 78.213.3   95.614.4     ?Hematocrit 39.0 - 52.0 % 40.4   44.6   46       ?Platelets 150 - 400 K/uL 614   688   376       ?  ? This result is from an external source.  ?  ?Lab Results  ?Component Value Date  ? NA 129 (L) 10/21/2021  ? K 3.7 10/21/2021  ? CL 100 10/21/2021  ? CO2 23 10/21/2021  ?  ? ? ?Assessment/Plan: ?Perforated diverticulitis with 6 cm abscess: Continue supportive care-n.p.o.  status-IVF-empiric Zosyn.  Unfortunately IR unable to drain abscess as not accessible by imaging.  Will await further recommendations from general surgery. ? ??  Bowel obstruction: Seen on imaging-but patient passing flatus.  Hopefully once inflammation improves with supportive care-this will improve. ? ?Hyponatremia: Mild-watch closely-repeat electrolytes tomorrow. ? ?DM-2 (A1c 6.0 on 4/14): Diet controlled at home-monitor with SSI.  ? ?Recent Labs  ?  10/21/21 ?0804  ?GLUCAP 132*  ?  ?  ?HTN: Stable-continue amlodipine, lisinopril and metoprolol.  Follow and adjust.   ? ?HLD: Continue fenofibrate/statin. ? ?CAD s/p CABG approximately 10 years ago: No anginal symptoms-on aspirin/statin/beta-blocker. ?  ?GERD: Continue PPI ?  ?Anxiety: Continue as needed Klonopin ?  ?Insomnia: Continue as needed temazepam ? ?OSA: CPAP nightly ? ?BMI: ?Estimated body mass index is 28.98 kg/m? as calculated from the following: ?  Height as of 03/30/21: 5\' 10"  (1.778 m). ?  Weight as of 03/30/21: 91.6 kg.  ? ?Code status: ?  Code Status: Full Code  ? ?DVT Prophylaxis: ?SCDs Start: 10/20/21 1800 ?Start prophylactic Lovenox 4/14 ?  ?Family Communication: None at bedside ? ? ?Disposition Plan: ?Status is: Inpatient ?Remains inpatient appropriate because: Perforated diverticulitis with abscess-n.p.o.-on IVF/IV antibiotics-not yet stable for discharge. ?  ?Planned Discharge Destination:Home ? ? ?Diet: ?Diet Order   ? ?       ?  Diet NPO time specified Except for: Sips with Meds  Diet effective now       ?  ? ?  ?  ? ?  ?  ? ? ?Antimicrobial agents: ?Anti-infectives (From admission, onward)  ? ? Start     Dose/Rate Route Frequency Ordered Stop  ? 10/20/21 2300  piperacillin-tazobactam (ZOSYN) IVPB 3.375 g       ? 3.375 g ?12.5 mL/hr over 240 Minutes Intravenous Every 8 hours 10/20/21 1854    ? 10/20/21 1700  piperacillin-tazobactam (ZOSYN) IVPB 3.375 g       ? 3.375 g ?100 mL/hr over 30 Minutes Intravenous  Once 10/20/21 1656 10/20/21 1745   ? ?  ? ? ? ?MEDICATIONS: ?Scheduled Meds: ? amLODipine  10 mg Oral Daily  ? aspirin EC  81 mg Oral Daily  ? fenofibrate  160 mg Oral Daily  ? fluticasone  2 spray Each Nare QHS  ? insulin aspart  0-9 Units Subcutaneous TID WC  ? lisinopril  40 mg Oral Daily  ? metoprolol tartrate  50 mg Oral BID  ? pantoprazole  40 mg Oral Daily  ? polyethylene glycol  17 g Oral Daily  ? rosuvastatin  10 mg Oral Daily  ? ?Continuous Infusions: ? sodium chloride 100 mL/hr at 10/21/21 0601  ? piperacillin-tazobactam (ZOSYN)  IV Stopped (10/21/21 1016)  ? ?PRN Meds:.acetaminophen **OR** acetaminophen, albuterol, artificial tears, clonazePAM, morphine injection, ondansetron **OR** ondansetron (ZOFRAN) IV, oxyCODONE, temazepam ? ? ?I have personally reviewed following labs and imaging studies ? ?LABORATORY DATA: ?CBC: ?Recent Labs  ?Lab 10/20/21 ?0852 10/21/21 ?0320  ?WBC 16.8* 15.5*  ?HGB 14.4 13.3  ?HCT 44.6 40.4  ?MCV 92.3 91.4  ?PLT 688* 614*  ? ? ?Basic Metabolic Panel: ?Recent Labs  ?Lab 10/20/21 ?0852 10/21/21 ?0320  ?NA 132* 129*  ?K 3.8 3.7  ?CL 97* 100  ?CO2 25 23  ?GLUCOSE 153* 131*  ?BUN 5* 8  ?CREATININE 0.96 0.79  ?CALCIUM 9.6 8.7*  ? ? ?GFR: ?CrCl cannot be calculated (Unknown ideal weight.). ? ?Liver Function Tests: ?Recent Labs  ?Lab 10/20/21 ?0852 10/21/21 ?0320  ?AST 21 18  ?ALT 12 8  ?ALKPHOS 56 51  ?BILITOT 0.8 0.6  ?PROT 7.5 6.2*  ?ALBUMIN 3.1* 2.6*  ? ?Recent Labs  ?Lab 10/20/21 ?0852  ?LIPASE 28  ? ?No results for input(s): AMMONIA in the last 168 hours. ? ?Coagulation Profile: ?No results for input(s): INR, PROTIME in the last 168 hours. ? ?Cardiac Enzymes: ?No results for input(s): CKTOTAL, CKMB, CKMBINDEX, TROPONINI in the last 168 hours. ? ?BNP (last 3 results) ?No results for input(s): PROBNP in the last 8760 hours. ? ?Lipid Profile: ?No results for input(s): CHOL, HDL, LDLCALC, TRIG, CHOLHDL, LDLDIRECT in the last 72 hours. ? ?Thyroid Function Tests: ?No results for input(s): TSH, T4TOTAL, FREET4, T3FREE,  THYROIDAB in the last 72 hours. ? ?Anemia Panel: ?No results for input(s): VITAMINB12, FOLATE, FERRITIN, TIBC, IRON, RETICCTPCT in the last 72 hours. ? ?Urine analysis: ?   ?Component Value Date/Time  ? COLORURINE YELLOW 10/20/2021 0855  ? APPEARANCEUR HAZY (A) 10/20/2021 0855  ? LABSPEC 1.012 10/20/2021 0855  ? PHURINE 6.0 10/20/2021 0855  ? GLUCOSEU NEGATIVE 10/20/2021 0855  ? HGBUR NEGATIVE 10/20/2021 0855  ? BILIRUBINUR NEGATIVE 10/20/2021 0855  ? KETONESUR NEGATIVE 10/20/2021 0855  ? PROTEINUR NEGATIVE 10/20/2021 0855  ? UROBILINOGEN 1.0 04/11/2007 1627  ? NITRITE NEGATIVE 10/20/2021 0855  ? LEUKOCYTESUR NEGATIVE 10/20/2021 0855  ? ? ?Sepsis Labs: ?Lactic Acid, Venous ?  No results found for: LATICACIDVEN ? ?MICROBIOLOGY: ?No results found for this or any previous visit (from the past 240 hour(s)). ? ?RADIOLOGY STUDIES/RESULTS: ?CT ABDOMEN PELVIS W CONTRAST ? ?Result Date: 10/20/2021 ?CLINICAL DATA:  Acute abdominal pain EXAM: CT ABDOMEN AND PELVIS WITH CONTRAST TECHNIQUE: Multidetector CT imaging of the abdomen and pelvis was performed using the standard protocol following bolus administration of intravenous contrast. RADIATION DOSE REDUCTION: This exam was performed according to the departmental dose-optimization program which includes automated exposure control, adjustment of the mA and/or kV according to patient size and/or use of iterative reconstruction technique. CONTRAST:  OMNIPAQUE IOHEXOL 300 MG/ML  SOLN COMPARISON:  None. FINDINGS: Lower chest: Small hiatal hernia. Bibasilar atelectasis. Coronary artery calcifications. Hepatobiliary: No focal liver abnormality is seen. No gallstones, gallbladder wall thickening, or biliary dilatation. Pancreas: Unremarkable. No pancreatic ductal dilatation or surrounding inflammatory changes. Spleen: Normal in size without focal abnormality. Adrenals/Urinary Tract: Bilateral adrenal glands are unremarkable. Kidneys enhance symmetrically with no evidence of  hydronephrosis or nephrolithiasis. Bilateral low-attenuation renal lesions, largest are compatible with simple cysts, others are too small to completely characterize, no further follow-up imaging is recommended for these les

## 2021-10-21 NOTE — Progress Notes (Signed)
? ?Progress Note ? ?   ?Subjective: ?Abdominal pain about the same from yesterday. He has had very little to eat for the last 4 days. No nausea or emesis. No other complaints ? ?Objective: ?Vital signs in last 24 hours: ?Temp:  [98.2 ?F (36.8 ?C)-98.7 ?F (37.1 ?C)] 98.2 ?F (36.8 ?C) (04/14 0445) ?Pulse Rate:  [69-96] 70 (04/14 0600) ?Resp:  [15-20] 18 (04/14 0600) ?BP: (101-144)/(62-103) 118/76 (04/14 0600) ?SpO2:  [87 %-99 %] 95 % (04/14 0600) ?  ? ?Intake/Output from previous day: ?04/13 0701 - 04/14 0700 ?In: 100 [IV Piggyback:100] ?Out: -  ?Intake/Output this shift: ?No intake/output data recorded. ? ?PE: ?General: pleasant, WD,  male who is laying in bed in NAD ?HEENT: Mouth is pink and moist ?Lungs: Respiratory effort nonlabored ?Abd: soft, ND, mild TTP right lower abdomen without rebound or guarding ?MSK: all 4 extremities are symmetrical with no cyanosis, clubbing, or edema. ?Skin: warm and dry with no masses, lesions, or rashes ?Psych: A&Ox3 with an appropriate affect.  ? ? ?Lab Results:  ?Recent Labs  ?  10/20/21 ?0852 10/21/21 ?0320  ?WBC 16.8* 15.5*  ?HGB 14.4 13.3  ?HCT 44.6 40.4  ?PLT 688* 614*  ? ?BMET ?Recent Labs  ?  10/20/21 ?0852 10/21/21 ?0320  ?NA 132* 129*  ?K 3.8 3.7  ?CL 97* 100  ?CO2 25 23  ?GLUCOSE 153* 131*  ?BUN 5* 8  ?CREATININE 0.96 0.79  ?CALCIUM 9.6 8.7*  ? ?PT/INR ?No results for input(s): LABPROT, INR in the last 72 hours. ?CMP  ?   ?Component Value Date/Time  ? NA 129 (L) 10/21/2021 0320  ? NA 137 01/19/2021 0000  ? K 3.7 10/21/2021 0320  ? CL 100 10/21/2021 0320  ? CO2 23 10/21/2021 0320  ? GLUCOSE 131 (H) 10/21/2021 0320  ? BUN 8 10/21/2021 0320  ? BUN 6 01/19/2021 0000  ? CREATININE 0.79 10/21/2021 0320  ? CREATININE 1.17 04/13/2016 1422  ? CALCIUM 8.7 (L) 10/21/2021 0320  ? PROT 6.2 (L) 10/21/2021 0320  ? ALBUMIN 2.6 (L) 10/21/2021 0320  ? AST 18 10/21/2021 0320  ? ALT 8 10/21/2021 0320  ? ALKPHOS 51 10/21/2021 0320  ? BILITOT 0.6 10/21/2021 0320  ? GFRNONAA >60 10/21/2021  0320  ? GFRNONAA 62 04/13/2016 1422  ? GFRAA 72 04/13/2016 1422  ? ?Lipase  ?   ?Component Value Date/Time  ? LIPASE 28 10/20/2021 0852  ? ? ? ? ? ?Studies/Results: ?CT ABDOMEN PELVIS W CONTRAST ? ?Result Date: 10/20/2021 ?CLINICAL DATA:  Acute abdominal pain EXAM: CT ABDOMEN AND PELVIS WITH CONTRAST TECHNIQUE: Multidetector CT imaging of the abdomen and pelvis was performed using the standard protocol following bolus administration of intravenous contrast. RADIATION DOSE REDUCTION: This exam was performed according to the departmental dose-optimization program which includes automated exposure control, adjustment of the mA and/or kV according to patient size and/or use of iterative reconstruction technique. CONTRAST:  OMNIPAQUE IOHEXOL 300 MG/ML  SOLN COMPARISON:  None. FINDINGS: Lower chest: Small hiatal hernia. Bibasilar atelectasis. Coronary artery calcifications. Hepatobiliary: No focal liver abnormality is seen. No gallstones, gallbladder wall thickening, or biliary dilatation. Pancreas: Unremarkable. No pancreatic ductal dilatation or surrounding inflammatory changes. Spleen: Normal in size without focal abnormality. Adrenals/Urinary Tract: Bilateral adrenal glands are unremarkable. Kidneys enhance symmetrically with no evidence of hydronephrosis or nephrolithiasis. Bilateral low-attenuation renal lesions, largest are compatible with simple cysts, others are too small to completely characterize, no further follow-up imaging is recommended for these lesions. Stomach/Bowel: Marked wall thickening of  the sigmoid colon with severe diverticular disease and a large amount of inflammatory change involving the left lower quadrant. Rim enhancing fluid collection of the mid lower abdomen measuring approximately 6.0 x 3.0 cm with fistulous track extending from the wall of the sigmoid colon. Upstream distended loops of large bowel. Inflammatory soft tissue extends to the bladder dome with reactive wall thickening of  the bladder dome. No evidence of colovesicular fistula. Vascular/Lymphatic: Aortic atherosclerosis. No enlarged abdominal or pelvic lymph nodes. Reproductive: Marked prostatomegaly, measuring up to 6.0 cm. Other: No abdominal wall hernia or abnormality. No abdominopelvic ascites. Musculoskeletal: No acute or significant osseous findings. IMPRESSION: 1. Findings compatible with perforated diverticulitis. Rim enhancing fluid collection of the mid lower abdomen measuring approximately 6.0 x 3.0 cm, compatible with abscess. 2. Upstream distension of the large bowel, concerning for secondary large bowel obstruction. 3. Inflammatory soft tissue extends to the bladder dome with reactive wall thickening of the bladder dome. No evidence of colovesicular fistula. 4. Coronary artery calcifications and aortic Atherosclerosis (ICD10-I70.0). Critical Value/emergent results were called by telephone at the time of interpretation on 10/20/2021 at 4:37 pm to provider Achille Rich , who verbally acknowledged these results. Electronically Signed   By: Allegra Lai M.D.   On: 10/20/2021 16:43   ? ?Anti-infectives: ?Anti-infectives (From admission, onward)  ? ? Start     Dose/Rate Route Frequency Ordered Stop  ? 10/20/21 2300  piperacillin-tazobactam (ZOSYN) IVPB 3.375 g       ? 3.375 g ?12.5 mL/hr over 240 Minutes Intravenous Every 8 hours 10/20/21 1854    ? 10/20/21 1700  piperacillin-tazobactam (ZOSYN) IVPB 3.375 g       ? 3.375 g ?100 mL/hr over 30 Minutes Intravenous  Once 10/20/21 1656 10/20/21 1745  ? ?  ? ? ? ?Assessment/Plan ?Diverticulitis with perforation and 6cm abscess  ?- IR consult for drain placement ?- WBC 15.5 (16.8) ?- continue IV abx and NPO for bowel rest ?- No current indication for emergency surgery ?- Hopefully patient will improve with conservative treatment. If he was to fail to improve with conservative management or was to acutely worsen he may need surgery during admission that would likely result in a  colectomy and colostomy.  ?- If patient improves with conservative therapies would recommend colonoscopy and follow up with colorectal surgery after   ? ?FEN: NPO ?ID: zosyn 4/13> ?VTE: okay for chemical prophylaxis from surgical standpoint ? ?I reviewed hospitalist notes, last 24 h vitals and pain scores, last 48 h intake and output, last 24 h labs and trends, and last 24 h imaging results. ? ?T2DM ?HTN ?HLD ?CAD s/p CABG ?GERD ?Anxiety ? ? LOS: 1 day  ? ?Eric Form, PA-C ?[facility] ?10/21/2021, 7:29 AM ?Please see Amion for pager number during day hours 7:00am-4:30pm ? ?

## 2021-10-21 NOTE — Progress Notes (Signed)
Isidore Moos Griess  ?Code Status: FULL ?ISSA SUNIGA is a 76 y.o. male patient admitted from ED awake, alert - oriented X4 - no acute distress noted. VSS -  no c/o shortness of breath, no c/o chest pain. Cardiac tele in place. ?Call light within reach, patient able to voice, and demonstrate understanding. Skin, clean-dry- intact without evidence of bruising, or skin tears.  ?No evidence of skin break down noted on exam.  ??  ?Will cont to eval and treat per MD orders.  ?Melonie Florida, RN  ?10/21/2021  ?   ?

## 2021-10-21 NOTE — Progress Notes (Signed)
Consulted regarding a perforated diverticulitis with fluid collection.  Upon review of imaging, the abscess is not accessible by imaging.  IR does not have anything to offer at this time.  Recommend referring to surgical team. ? ?Electronically Signed: ?Pasty Spillers, PA-C ?10/21/2021, 8:57 AM ? ? ?

## 2021-10-22 DIAGNOSIS — F419 Anxiety disorder, unspecified: Secondary | ICD-10-CM | POA: Diagnosis not present

## 2021-10-22 DIAGNOSIS — I2510 Atherosclerotic heart disease of native coronary artery without angina pectoris: Secondary | ICD-10-CM | POA: Diagnosis not present

## 2021-10-22 DIAGNOSIS — I10 Essential (primary) hypertension: Secondary | ICD-10-CM | POA: Diagnosis not present

## 2021-10-22 DIAGNOSIS — K5720 Diverticulitis of large intestine with perforation and abscess without bleeding: Secondary | ICD-10-CM | POA: Diagnosis not present

## 2021-10-22 LAB — CBC WITH DIFFERENTIAL/PLATELET
Abs Immature Granulocytes: 0.07 10*3/uL (ref 0.00–0.07)
Basophils Absolute: 0 10*3/uL (ref 0.0–0.1)
Basophils Relative: 0 %
Eosinophils Absolute: 0.1 10*3/uL (ref 0.0–0.5)
Eosinophils Relative: 1 %
HCT: 38.5 % — ABNORMAL LOW (ref 39.0–52.0)
Hemoglobin: 12.8 g/dL — ABNORMAL LOW (ref 13.0–17.0)
Immature Granulocytes: 1 %
Lymphocytes Relative: 9 %
Lymphs Abs: 0.9 10*3/uL (ref 0.7–4.0)
MCH: 30 pg (ref 26.0–34.0)
MCHC: 33.2 g/dL (ref 30.0–36.0)
MCV: 90.4 fL (ref 80.0–100.0)
Monocytes Absolute: 1.2 10*3/uL — ABNORMAL HIGH (ref 0.1–1.0)
Monocytes Relative: 11 %
Neutro Abs: 8.8 10*3/uL — ABNORMAL HIGH (ref 1.7–7.7)
Neutrophils Relative %: 78 %
Platelets: 507 10*3/uL — ABNORMAL HIGH (ref 150–400)
RBC: 4.26 MIL/uL (ref 4.22–5.81)
RDW: 13.3 % (ref 11.5–15.5)
WBC: 11.1 10*3/uL — ABNORMAL HIGH (ref 4.0–10.5)
nRBC: 0 % (ref 0.0–0.2)

## 2021-10-22 LAB — GLUCOSE, CAPILLARY
Glucose-Capillary: 121 mg/dL — ABNORMAL HIGH (ref 70–99)
Glucose-Capillary: 122 mg/dL — ABNORMAL HIGH (ref 70–99)
Glucose-Capillary: 138 mg/dL — ABNORMAL HIGH (ref 70–99)
Glucose-Capillary: 113 mg/dL — ABNORMAL HIGH (ref 70–99)

## 2021-10-22 LAB — BASIC METABOLIC PANEL
Anion gap: 7 (ref 5–15)
BUN: 7 mg/dL — ABNORMAL LOW (ref 8–23)
CO2: 22 mmol/L (ref 22–32)
Calcium: 8.4 mg/dL — ABNORMAL LOW (ref 8.9–10.3)
Chloride: 105 mmol/L (ref 98–111)
Creatinine, Ser: 0.75 mg/dL (ref 0.61–1.24)
GFR, Estimated: >60 mL/min (ref >60)
Glucose, Bld: 100 mg/dL — ABNORMAL HIGH (ref 70–99)
Potassium: 3.5 mmol/L (ref 3.5–5.1)
Sodium: 134 mmol/L — ABNORMAL LOW (ref 135–145)

## 2021-10-22 MED ORDER — POLYETHYLENE GLYCOL 3350 17 G PO PACK
17.0000 g | PACK | Freq: Every day | ORAL | Status: DC
  Administered 2021-10-23 – 2021-10-25 (×3): 17 g via ORAL
  Filled 2021-10-22 (×3): qty 1

## 2021-10-22 NOTE — Progress Notes (Signed)
?      ?                 PROGRESS NOTE ? ?      ?PATIENT DETAILS ?Name: Robert Barrett ?Age: 76 y.o. ?Sex: male ?Date of Birth: Sep 05, 1945 ?Admit Date: 10/20/2021 ?Admitting Physician Jonetta Osgood, MD ?OX:[id number], [name] is a 75 y.o.  male with past medical history of DM-2, HTN, HLD, CAD s/p CABG-presented with abdominal pain-found to have perforated diverticulitis with abscess. ? ? ?Significant events: ?4/13>> admit to [facility] for perforated diverticulitis with 6 cm abscess. ?4/14>> per IR-no appropriate window to place drain. ? ?Significant studies: ?4/13>> CT abdomen/pelvis: Perforated diverticulitis-6 cm abscess.  Upstream distention of large bowel-concerning for secondary large bowel obstruction. ? ?Significant microbiology data: ?None ? ?Procedures: ?None ? ?Consults: ?General surgery ? ?Subjective: ?Passing flatus-no BM.  Some improvement in abdominal pain. ? ?Objective: ?Vitals: ?Blood pressure 105/90, pulse 83, temperature 97.8 ?F (36.6 ?C), temperature source Oral, resp. rate 18, SpO2 94 %.  ? ?Exam: ?Gen Exam:Alert awake-not in any distress ?HEENT:atraumatic, normocephalic ?Chest: B/L clear to auscultation anteriorly ?CVS:S1S2 regular ?Abdomen: Soft-mild tenderness in the lower abdominal area. ?Extremities:no edema ?Neurology: Non focal ?Skin: no rash  ? ?Pertinent Labs/Radiology: ? ?  Latest Ref Rng & Units 10/22/2021  ? 12:55 AM 10/21/2021  ?  3:20 AM 10/20/2021  ?  8:52 AM  ?CBC  ?WBC 4.0 - 10.5 K/uL 11.1   15.5   16.8    ?Hemoglobin 13.0 - 17.0 g/dL 12.8   13.3   14.4    ?Hematocrit 39.0 - 52.0 % 38.5   40.4   44.6    ?Platelets 150 - 400 K/uL 507   614   688    ?  ?Lab Results  ?Component Value Date  ? NA 134 (L) 10/22/2021  ? K 3.5 10/22/2021  ? CL 105 10/22/2021  ? CO2 22 10/22/2021  ? ?  ? ? ?Assessment/Plan: ?Perforated diverticulitis with 6 cm abscess: Some improvement in abdominal pain overnight-passing flatus-no BM-unfortunately-IR unable to place drain as not  accessible by imaging.  Continue Zosyn-General surgery following-started on clear liquid diet today.   ? ??  Bowel obstruction: Seen on imaging-but clinically does not appear to have a bowel obstruction.  No BM but passing flatus.  Follow closely. ? ?Hyponatremia: Mild-watch closely. ? ?DM-2 (A1c 6.0 on 4/14): Diet controlled at home-monitor with SSI.  ? ?Recent Labs  ?  10/21/21 ?1714 10/21/21 ?1934 10/22/21 ?0843  ?GLUCAP 116* 117* 121*  ? ?  ?  ?HTN: Stable-continue amlodipine, lisinopril and metoprolol.  Follow and adjust.   ? ?HLD: Continue fenofibrate/statin. ? ?CAD s/p CABG approximately 10 years ago: No anginal symptoms-on aspirin/statin/beta-blocker. ?  ?GERD: Continue PPI ?  ?Anxiety: Continue as needed Klonopin ?  ?Insomnia: Continue as needed temazepam ? ?OSA: CPAP nightly ? ?BMI: ?Estimated body mass index is 28.98 kg/m? as calculated from the following: ?  Height as of 03/30/21: 5\' 10"  (1.778 m). ?  Weight as of 03/30/21: 91.6 kg.  ? ?Code status: ?  Code Status: Full Code  ? ?DVT Prophylaxis: ?enoxaparin (LOVENOX) injection 40 mg Start: 10/21/21 1200 ?SCDs Start: 10/20/21 1800 ?Start prophylactic Lovenox 4/14 ?  ?Family Communication: Spouse-Pamela-[phone]-updated over the phone on 4/15 ? ? ?Disposition Plan: ?Status is: Inpatient ?Remains inpatient appropriate because: Perforated diverticulitis with abscess-n.p.o.-on IVF/IV antibiotics-not yet stable for discharge. ?  ?Planned Discharge Destination:Home ? ? ?Diet: ?Diet Order   ? ?       ?  Diet clear liquid Room service appropriate? Yes; Fluid consistency: Thin  Diet effective now       ?  ? ?  ?  ? ?  ?  ? ? ?Antimicrobial agents: ?Anti-infectives (From admission, onward)  ? ? Start     Dose/Rate Route Frequency Ordered Stop  ? 10/20/21 2300  piperacillin-tazobactam (ZOSYN) IVPB 3.375 g       ? 3.375 g ?12.5 mL/hr over 240 Minutes Intravenous Every 8 hours 10/20/21 1854    ? 10/20/21 1700  piperacillin-tazobactam (ZOSYN) IVPB 3.375 g       ?  3.375 g ?100 mL/hr over 30 Minutes Intravenous  Once 10/20/21 1656 10/20/21 1745  ? ?  ? ? ? ?MEDICATIONS: ?Scheduled Meds: ? amLODipine  10 mg Oral Daily  ? aspirin EC  81 mg Oral Daily  ? enoxaparin (LOVENOX) injection  40 mg Subcutaneous Q24H  ? fenofibrate  160 mg Oral Daily  ? fluticasone  2 spray Each Nare QHS  ? insulin aspart  0-9 Units Subcutaneous TID WC  ? lisinopril  40 mg Oral Daily  ? metoprolol tartrate  50 mg Oral BID  ? pantoprazole  40 mg Oral BID AC  ? polyethylene glycol  17 g Oral Daily  ? rosuvastatin  10 mg Oral Daily  ? ?Continuous Infusions: ? sodium chloride 100 mL/hr at 10/22/21 0030  ? piperacillin-tazobactam (ZOSYN)  IV 3.375 g (10/22/21 0659)  ? ?PRN Meds:.acetaminophen **OR** acetaminophen, albuterol, artificial tears, clonazePAM, morphine injection, ondansetron **OR** ondansetron (ZOFRAN) IV, oxyCODONE, temazepam ? ? ?I have personally reviewed following labs and imaging studies ? ?LABORATORY DATA: ?CBC: ?Recent Labs  ?Lab 10/20/21 ?0852 10/21/21 ?0320 10/22/21 ?ST:[id number]  ?WBC 16.8* 15.5* 11.1*  ?NEUTROABS  --   --  8.8*  ?HGB 14.4 13.3 12.8*  ?HCT 44.6 40.4 38.5*  ?MCV 92.3 91.4 90.4  ?PLT 688* 614* 507*  ? ? ? ?Basic Metabolic Panel: ?Recent Labs  ?Lab 10/20/21 ?0852 10/21/21 ?0320 10/22/21 ?ST:[id number]  ?NA 132* 129* 134*  ?K 3.8 3.7 3.5  ?CL 97* 100 105  ?CO2 25 23 22   ?GLUCOSE 153* 131* 100*  ?BUN 5* 8 7*  ?CREATININE 0.96 0.79 0.75  ?CALCIUM 9.6 8.7* 8.4*  ? ? ? ?GFR: ?CrCl cannot be calculated (Unknown ideal weight.). ? ?Liver Function Tests: ?Recent Labs  ?Lab 10/20/21 ?0852 10/21/21 ?0320  ?AST 21 18  ?ALT 12 8  ?ALKPHOS 56 51  ?BILITOT 0.8 0.6  ?PROT 7.5 6.2*  ?ALBUMIN 3.1* 2.6*  ? ? ?Recent Labs  ?Lab 10/20/21 ?[id number]  ?LIPASE 28  ? ? ?No results for input(s): AMMONIA in the last 168 hours. ? ?Coagulation Profile: ?No results for input(s): INR, PROTIME in the last 168 hours. ? ?Cardiac Enzymes: ?No results for input(s): CKTOTAL, CKMB, CKMBINDEX, TROPONINI in the last 168 hours. ? ?BNP  (last 3 results) ?No results for input(s): PROBNP in the last 8760 hours. ? ?Lipid Profile: ?No results for input(s): CHOL, HDL, LDLCALC, TRIG, CHOLHDL, LDLDIRECT in the last 72 hours. ? ?Thyroid Function Tests: ?No results for input(s): TSH, T4TOTAL, FREET4, T3FREE, THYROIDAB in the last 72 hours. ? ?Anemia Panel: ?No results for input(s): VITAMINB12, FOLATE, FERRITIN, TIBC, IRON, RETICCTPCT in the last 72 hours. ? ?Urine analysis: ?   ?Component Value Date/Time  ? [place] YELLOW 10/20/2021 0855  ? APPEARANCEUR HAZY (A) 10/20/2021 0855  ? LABSPEC 1.012 10/20/2021 0855  ? PHURINE 6.0 10/20/2021 0855  ? [place] NEGATIVE 10/20/2021 0855  ? [place] NEGATIVE 10/20/2021 0855  ?  [place] NEGATIVE 10/20/2021 0855  ? [place] NEGATIVE 10/20/2021 0855  ? [place] NEGATIVE 10/20/2021 0855  ? UROBILINOGEN 1.0 04/11/2007 1627  ? NITRITE NEGATIVE 10/20/2021 0855  ? LEUKOCYTESUR NEGATIVE 10/20/2021 0855  ? ? ?Sepsis Labs: ?Lactic Acid, Venous ?No results found for: LATICACIDVEN ? ?MICROBIOLOGY: ?No results found for this or any previous visit (from the past 240 hour(s)). ? ?RADIOLOGY STUDIES/RESULTS: ?CT ABDOMEN PELVIS W CONTRAST ? ?Result Date: 10/20/2021 ?CLINICAL DATA:  Acute abdominal pain EXAM: CT ABDOMEN AND PELVIS WITH CONTRAST TECHNIQUE: Multidetector CT imaging of the abdomen and pelvis was performed using the standard protocol following bolus administration of intravenous contrast. RADIATION DOSE REDUCTION: This exam was performed according to the departmental dose-optimization program which includes automated exposure control, adjustment of the mA and/or kV according to patient size and/or use of iterative reconstruction technique. CONTRAST:  159mL OMNIPAQUE IOHEXOL 300 MG/ML  SOLN COMPARISON:  None. FINDINGS: Lower chest: Small hiatal hernia. Bibasilar atelectasis. Coronary artery calcifications. Hepatobiliary: No focal liver abnormality is seen. No gallstones, gallbladder wall thickening, or biliary dilatation.  Pancreas: Unremarkable. No pancreatic ductal dilatation or surrounding inflammatory changes. Spleen: Normal in size without focal abnormality. Adrenals/Urinary Tract: Bilateral adrenal glands are unremarkab

## 2021-10-22 NOTE — Progress Notes (Signed)
? ? ?Assessment & Plan: ?Diverticulitis with perforation, abscess  ?- IR unable to access abscess for drain placement ?- WBC 11, improving on IV Zosyn ? - allow clear liquid diet ? - encouraged OOB, ambulation ?- no current indication for emergency surgery ?- plan repeat CT scan early next week if continued clinical improvement ?  ? ?      Darnell Level, MD ?      [facility] ?      Office: [phone] ? ? ?Chief Complaint: ?Perforated diverticulitis with abscess ? ?Subjective: ?Patient in bed, intermittent pain ? ?Objective: ?Vital signs in last 24 hours: ?Temp:  [97.8 ?F (36.6 ?C)-98.4 ?F (36.9 ?C)] 97.8 ?F (36.6 ?C) (04/15 1497) ?Pulse Rate:  [63-85] 83 (04/15 0841) ?Resp:  [14-18] 18 (04/15 0841) ?BP: (91-131)/(54-90) 105/90 (04/15 0841) ?SpO2:  [90 %-97 %] 94 % (04/15 0841) ?Last BM Date : 10/15/21 ? ?Intake/Output from previous day: ?No intake/output data recorded. ?Intake/Output this shift: ?No intake/output data recorded. ? ?Physical Exam: ?HEENT - sclerae clear, mucous membranes moist ?Abdomen - soft, obese; mild tenderness; no mass ? ?Lab Results:  ?Recent Labs  ?  10/21/21 ?0320 10/22/21 ?0263  ?WBC 15.5* 11.1*  ?HGB 13.3 12.8*  ?HCT 40.4 38.5*  ?PLT 614* 507*  ? ?BMET ?Recent Labs  ?  10/21/21 ?0320 10/22/21 ?7858  ?NA 129* 134*  ?K 3.7 3.5  ?CL 100 105  ?CO2 23 22  ?GLUCOSE 131* 100*  ?BUN 8 7*  ?CREATININE 0.79 0.75  ?CALCIUM 8.7* 8.4*  ? ?PT/INR ?No results for input(s): LABPROT, INR in the last 72 hours. ?Comprehensive Metabolic Panel: ?   ?Component Value Date/Time  ? NA 134 (L) 10/22/2021 0055  ? NA 129 (L) 10/21/2021 0320  ? NA 137 01/19/2021 0000  ? K 3.5 10/22/2021 0055  ? K 3.7 10/21/2021 0320  ? CL 105 10/22/2021 0055  ? CL 100 10/21/2021 0320  ? CO2 22 10/22/2021 0055  ? CO2 23 10/21/2021 0320  ? BUN 7 (L) 10/22/2021 0055  ? BUN 8 10/21/2021 0320  ? BUN 6 01/19/2021 0000  ? CREATININE 0.75 10/22/2021 0055  ? CREATININE 0.79 10/21/2021 0320  ? CREATININE 1.17 04/13/2016 1422   ? GLUCOSE 100 (H) 10/22/2021 0055  ? GLUCOSE 131 (H) 10/21/2021 0320  ? CALCIUM 8.4 (L) 10/22/2021 0055  ? CALCIUM 8.7 (L) 10/21/2021 0320  ? AST 18 10/21/2021 0320  ? AST 21 10/20/2021 0852  ? ALT 8 10/21/2021 0320  ? ALT 12 10/20/2021 0852  ? ALKPHOS 51 10/21/2021 0320  ? ALKPHOS 56 10/20/2021 0852  ? BILITOT 0.6 10/21/2021 0320  ? BILITOT 0.8 10/20/2021 0852  ? PROT 6.2 (L) 10/21/2021 0320  ? PROT 7.5 10/20/2021 0852  ? ALBUMIN 2.6 (L) 10/21/2021 0320  ? ALBUMIN 3.1 (L) 10/20/2021 8502  ? ? ?Studies/Results: ?CT ABDOMEN PELVIS W CONTRAST ? ?Result Date: 10/20/2021 ?CLINICAL DATA:  Acute abdominal pain EXAM: CT ABDOMEN AND PELVIS WITH CONTRAST TECHNIQUE: Multidetector CT imaging of the abdomen and pelvis was performed using the standard protocol following bolus administration of intravenous contrast. RADIATION DOSE REDUCTION: This exam was performed according to the departmental dose-optimization program which includes automated exposure control, adjustment of the mA and/or kV according to patient size and/or use of iterative reconstruction technique. CONTRAST:  OMNIPAQUE IOHEXOL 300 MG/ML  SOLN COMPARISON:  None. FINDINGS: Lower chest: Small hiatal hernia. Bibasilar atelectasis. Coronary artery calcifications. Hepatobiliary: No focal liver abnormality is seen. No gallstones, gallbladder wall thickening, or  biliary dilatation. Pancreas: Unremarkable. No pancreatic ductal dilatation or surrounding inflammatory changes. Spleen: Normal in size without focal abnormality. Adrenals/Urinary Tract: Bilateral adrenal glands are unremarkable. Kidneys enhance symmetrically with no evidence of hydronephrosis or nephrolithiasis. Bilateral low-attenuation renal lesions, largest are compatible with simple cysts, others are too small to completely characterize, no further follow-up imaging is recommended for these lesions. Stomach/Bowel: Marked wall thickening of the sigmoid colon with severe diverticular disease and a  large amount of inflammatory change involving the left lower quadrant. Rim enhancing fluid collection of the mid lower abdomen measuring approximately 6.0 x 3.0 cm with fistulous track extending from the wall of the sigmoid colon. Upstream distended loops of large bowel. Inflammatory soft tissue extends to the bladder dome with reactive wall thickening of the bladder dome. No evidence of colovesicular fistula. Vascular/Lymphatic: Aortic atherosclerosis. No enlarged abdominal or pelvic lymph nodes. Reproductive: Marked prostatomegaly, measuring up to 6.0 cm. Other: No abdominal wall hernia or abnormality. No abdominopelvic ascites. Musculoskeletal: No acute or significant osseous findings. IMPRESSION: 1. Findings compatible with perforated diverticulitis. Rim enhancing fluid collection of the mid lower abdomen measuring approximately 6.0 x 3.0 cm, compatible with abscess. 2. Upstream distension of the large bowel, concerning for secondary large bowel obstruction. 3. Inflammatory soft tissue extends to the bladder dome with reactive wall thickening of the bladder dome. No evidence of colovesicular fistula. 4. Coronary artery calcifications and aortic Atherosclerosis (ICD10-I70.0). Critical Value/emergent results were called by telephone at the time of interpretation on 10/20/2021 at 4:37 pm to provider Achille Rich , who verbally acknowledged these results. Electronically Signed   By: Allegra Lai M.D.   On: 10/20/2021 16:43   ? ? ? ?Darnell Level ?10/22/2021 ? ? Patient ID: Robert Barrett, male   DOB: 1945-08-01, 76 y.o.   MRN: [id number] ? ?

## 2021-10-22 NOTE — Progress Notes (Signed)
Cpap refused 

## 2021-10-23 DIAGNOSIS — K5720 Diverticulitis of large intestine with perforation and abscess without bleeding: Secondary | ICD-10-CM | POA: Diagnosis not present

## 2021-10-23 DIAGNOSIS — F419 Anxiety disorder, unspecified: Secondary | ICD-10-CM | POA: Diagnosis not present

## 2021-10-23 DIAGNOSIS — K219 Gastro-esophageal reflux disease without esophagitis: Secondary | ICD-10-CM | POA: Diagnosis not present

## 2021-10-23 DIAGNOSIS — I10 Essential (primary) hypertension: Secondary | ICD-10-CM | POA: Diagnosis not present

## 2021-10-23 LAB — BASIC METABOLIC PANEL
Anion gap: 8 (ref 5–15)
BUN: 5 mg/dL — ABNORMAL LOW (ref 8–23)
CO2: 21 mmol/L — ABNORMAL LOW (ref 22–32)
Calcium: 8.6 mg/dL — ABNORMAL LOW (ref 8.9–10.3)
Chloride: 102 mmol/L (ref 98–111)
Creatinine, Ser: 0.66 mg/dL (ref 0.61–1.24)
GFR, Estimated: >60 mL/min (ref >60)
Glucose, Bld: 114 mg/dL — ABNORMAL HIGH (ref 70–99)
Potassium: 3.4 mmol/L — ABNORMAL LOW (ref 3.5–5.1)
Sodium: 131 mmol/L — ABNORMAL LOW (ref 135–145)

## 2021-10-23 LAB — CBC
HCT: 40 % (ref 39.0–52.0)
Hemoglobin: 13.3 g/dL (ref 13.0–17.0)
MCH: 30.5 pg (ref 26.0–34.0)
MCHC: 33.3 g/dL (ref 30.0–36.0)
MCV: 91.7 fL (ref 80.0–100.0)
Platelets: 610 10*3/uL — ABNORMAL HIGH (ref 150–400)
RBC: 4.36 MIL/uL (ref 4.22–5.81)
RDW: 13.3 % (ref 11.5–15.5)
WBC: 12.8 10*3/uL — ABNORMAL HIGH (ref 4.0–10.5)
nRBC: 0 % (ref 0.0–0.2)

## 2021-10-23 LAB — GLUCOSE, CAPILLARY
Glucose-Capillary: 121 mg/dL — ABNORMAL HIGH (ref 70–99)
Glucose-Capillary: 218 mg/dL — ABNORMAL HIGH (ref 70–99)
Glucose-Capillary: 190 mg/dL — ABNORMAL HIGH (ref 70–99)
Glucose-Capillary: 182 mg/dL — ABNORMAL HIGH (ref 70–99)

## 2021-10-23 MED ORDER — POTASSIUM CHLORIDE CRYS ER 20 MEQ PO TBCR
40.0000 meq | EXTENDED_RELEASE_TABLET | Freq: Once | ORAL | Status: AC
  Administered 2021-10-23: 40 meq via ORAL
  Filled 2021-10-23: qty 2

## 2021-10-23 MED ORDER — BOOST / RESOURCE BREEZE PO LIQD CUSTOM
1.0000 | Freq: Three times a day (TID) | ORAL | Status: DC
Start: 2021-10-23 — End: 2021-10-31
  Administered 2021-10-23 – 2021-10-30 (×17): 1 via ORAL
  Filled 2021-10-23: qty 1

## 2021-10-23 MED ORDER — ALUM & MAG HYDROXIDE-SIMETH 200-200-20 MG/5ML PO SUSP
30.0000 mL | ORAL | Status: DC | PRN
Start: 2021-10-23 — End: 2021-10-26
  Administered 2021-10-23: 30 mL via ORAL
  Filled 2021-10-23: qty 30

## 2021-10-23 MED ORDER — PROSOURCE PLUS PO LIQD
30.0000 mL | Freq: Two times a day (BID) | ORAL | Status: DC
  Administered 2021-10-23 – 2021-10-25 (×6): 30 mL via ORAL
  Filled 2021-10-23 (×6): qty 30

## 2021-10-23 NOTE — Progress Notes (Signed)
Initial Nutrition Assessment ? ?DOCUMENTATION CODES:  ? ?Not applicable ? ?INTERVENTION:  ?Advance diet as tolerated per GI, encourage PO intake ?Boost Breeze po TID, each supplement provides 250 kcal and 9 grams of protein ?Prosource plus BID to provide 100kcal and 15g of protein per packets. ? ?NUTRITION DIAGNOSIS:  ? ?Inadequate oral intake related to  (restrictive diet) as evidenced by  (current diet order inadequate to meet estimated needs). ? ?GOAL:  ? ?Patient will meet greater than or equal to 90% of their needs ? ?MONITOR:  ? ?PO intake, Supplement acceptance, Diet advancement, I & O's, Labs ? ?REASON FOR ASSESSMENT:  ? ?Malnutrition Screening Tool ?  ? ?ASSESSMENT:  ? ?76 y.o. male with hx od DM type 2, HTN, HLD, and CAD s/p CABG presented to ED with abdominal pain x 3 weeks. Imaging in ED showed a perforated diverticulitis with 6 x 3 cm abscess ? ?IR unable to place drain, surgery following but no plans for emergent interventions at this time. ? ?Attempted to call pt on room phone, no answer at this time. Reviewed chart and  9.7% weight loss noted in the last 7 months which is not severe, but concerning due to advanced age. No intake this admission recorded in flowsheet, but surgery team notes that pt is tolerating his clear liquid diet.  ? ?Will add nutrition supplements to aid in meet nutrition needs. If pt is to remain on clear liquids for several more days, would likely benefit from supplemental TPN to avoid further weight loss and malnutrition. ? ?Nutritionally Relevant Medications: ?Scheduled Meds: ? fenofibrate  160 mg Oral Daily  ? insulin aspart  0-9 Units Subcutaneous TID WC  ? pantoprazole  40 mg Oral BID AC  ? polyethylene glycol  17 g Oral Daily  ? potassium chloride  40 mEq Oral Once  ? rosuvastatin  10 mg Oral Daily  ? ?Continuous Infusions: ? sodium chloride 75 mL/hr at 10/23/21 0405  ? piperacillin-tazobactam (ZOSYN)  IV 3.375 g (10/23/21 0938)  ? ?PRN Meds: ondansetron ? ?Labs  Reviewed: ?Sodium 131 ?Potassium 3.4 ?CBG ranges from 113-138 mg/dL over the last 24 hours ?HgbA1c 6.0%  ? ?NUTRITION - FOCUSED PHYSICAL EXAM: ?Defer to in-person assessment ? ?Diet Order:   ?Diet Order   ? ?       ?  Diet clear liquid Room service appropriate? Yes; Fluid consistency: Thin  Diet effective now       ?  ? ?  ?  ? ?  ? ? ?EDUCATION NEEDS:  ? ?No education needs have been identified at this time ? ?Skin:  Skin Assessment: Reviewed RN Assessment ? ?Last BM:  4/8 ? ?Height:  ? ?Ht Readings from Last 1 Encounters:  ?10/23/21 5\' 10"  (1.778 m)  ? ? ?Weight:  ? ?Wt Readings from Last 1 Encounters:  ?10/23/21 82.7 kg  ? ? ?Ideal Body Weight:  75.5 kg ? ?BMI:  Body mass index is 26.16 kg/m?. ? ?Estimated Nutritional Needs:  ? ?Kcal:  2000-2200 kcal/d ? ?Protein:  100-110 g/d ? ?Fluid:  2-2.2 L/d ? ? ? ?10/25/21, RD, LDN ?Clinical Dietitian ?RD pager # available in AMION  ?After hours/weekend pager # available in AMION ?

## 2021-10-23 NOTE — Progress Notes (Signed)
?      ?                 PROGRESS NOTE ? ?      ?PATIENT DETAILS ?Name: Robert Barrett ?Age: 76 y.o. ?Sex: male ?Date of Birth: 1946/01/13 ?Admit Date: 10/20/2021 ?Admitting Physician Jonetta Osgood, MD ?[id number], [name] is a 76 y.o.  male with past medical history of DM-2, HTN, HLD, CAD s/p CABG-presented with abdominal pain-found to have perforated diverticulitis with abscess. ? ?Significant events: ?4/13>> admit to [facility] for perforated diverticulitis with 6 cm abscess. ?4/14>> per IR-no appropriate window to place drain. ? ?Significant studies: ?4/13>> CT abdomen/pelvis: Perforated diverticulitis-6 cm abscess.  Upstream distention of large bowel-concerning for secondary large bowel obstruction. ? ?Significant microbiology data: ?None ? ?Procedures: ?None ? ?Consults: ?General surgery ? ?Subjective: ?Minimal abdominal pain-passing flatus but no BM since hospitalization. ? ?Objective: ?Vitals: ?Blood pressure 115/76, pulse 77, temperature 98.5 ?F (36.9 ?C), temperature source Oral, resp. rate 18, height 5\' 10"  (1.778 m), weight 82.7 kg, SpO2 93 %.  ? ?Exam: ?Gen Exam:Alert awake-not in any distress ?HEENT:atraumatic, normocephalic ?Chest: B/L clear to auscultation anteriorly ?CVS:S1S2 regular ?Abdomen:soft non tender, non distended ?Extremities:no edema ?Neurology: Non focal ?Skin: no rash   ? ?Pertinent Labs/Radiology: ? ?  Latest Ref Rng & Units 10/23/2021  ? 12:38 AM 10/22/2021  ? 12:55 AM 10/21/2021  ?  3:20 AM  ?CBC  ?WBC 4.0 - 10.5 K/uL 12.8   11.1   15.5    ?Hemoglobin 13.0 - 17.0 g/dL 13.3   12.8   13.3    ?Hematocrit 39.0 - 52.0 % 40.0   38.5   40.4    ?Platelets 150 - 400 K/uL 610   507   614    ?  ?Lab Results  ?Component Value Date  ? NA 131 (L) 10/23/2021  ? K 3.4 (L) 10/23/2021  ? CL 102 10/23/2021  ? CO2 21 (L) 10/23/2021  ? ?  ? ? ?Assessment/Plan: ?Perforated diverticulitis with 6 cm abscess: Abdominal pain has improved compared to presentation-no BM-IR unable to  place drain-continue Zosyn-General surgery following with plans to repeat CT imaging tomorrow.   ? ??  Bowel obstruction: Seen on imaging-but clinically does not appear to have a bowel obstruction.  No BM but passing flatus.  Follow closely. ? ?Hyponatremia: Mild-watch closely. ? ?Hypokalemia: Replete and recheck. ? ?DM-2 (A1c 6.0 on 4/14): Diet controlled at home-monitor with SSI.  ? ?Recent Labs  ?  10/22/21 ?1617 10/22/21 ?1923 10/23/21 ?0809  ?GLUCAP 138* 113* 121*  ? ?  ?  ?HTN: Stable-continue amlodipine, lisinopril and metoprolol.  Follow and adjust.   ? ?HLD: Continue fenofibrate/statin. ? ?CAD s/p CABG approximately 10 years ago: No anginal symptoms-on aspirin/statin/beta-blocker. ?  ?GERD: Continue PPI ?  ?Anxiety: Continue as needed Klonopin ?  ?Insomnia: Continue as needed temazepam ? ?OSA: CPAP nightly ? ?BMI: ?Estimated body mass index is 26.16 kg/m? as calculated from the following: ?  Height as of this encounter: 5\' 10"  (1.778 m). ?  Weight as of this encounter: 82.7 kg.  ? ?Code status: ?  Code Status: Full Code  ? ?DVT Prophylaxis: ?enoxaparin (LOVENOX) injection 40 mg Start: 10/21/21 1200 ?SCDs Start: 10/20/21 1800 ?Start prophylactic Lovenox 4/14 ?  ?Family Communication: Spouse-[phone]-updated over the phone on 4/15 ? ? ?Disposition Plan: ?Status is: Inpatient ?Remains inpatient appropriate because: Perforated diverticulitis with abscess-n.p.o.-on IVF/IV antibiotics-not yet stable for discharge. ?  ?Planned Discharge Destination:Home ? ? ?  Diet: ?Diet Order   ? ?       ?  Diet clear liquid Room service appropriate? Yes; Fluid consistency: Thin  Diet effective now       ?  ? ?  ?  ? ?  ?  ? ? ?Antimicrobial agents: ?Anti-infectives (From admission, onward)  ? ? Start     Dose/Rate Route Frequency Ordered Stop  ? 10/20/21 2300  piperacillin-tazobactam (ZOSYN) IVPB 3.375 g       ? 3.375 g ?12.5 mL/hr over 240 Minutes Intravenous Every 8 hours 10/20/21 1854    ? 10/20/21 1700   piperacillin-tazobactam (ZOSYN) IVPB 3.375 g       ? 3.375 g ?100 mL/hr over 30 Minutes Intravenous  Once 10/20/21 1656 10/20/21 1745  ? ?  ? ? ? ?MEDICATIONS: ?Scheduled Meds: ? (feeding supplement) PROSource Plus  30 mL Oral BID BM  ? amLODipine  10 mg Oral Daily  ? aspirin EC  81 mg Oral Daily  ? enoxaparin (LOVENOX) injection  40 mg Subcutaneous Q24H  ? feeding supplement  1 Container Oral TID BM  ? fenofibrate  160 mg Oral Daily  ? fluticasone  2 spray Each Nare QHS  ? insulin aspart  0-9 Units Subcutaneous TID WC  ? lisinopril  40 mg Oral Daily  ? metoprolol tartrate  50 mg Oral BID  ? pantoprazole  40 mg Oral BID AC  ? polyethylene glycol  17 g Oral Daily  ? rosuvastatin  10 mg Oral Daily  ? ?Continuous Infusions: ? sodium chloride 75 mL/hr at 10/23/21 0405  ? piperacillin-tazobactam (ZOSYN)  IV 3.375 g (10/23/21 [id number])  ? ?PRN Meds:.acetaminophen **OR** acetaminophen, albuterol, artificial tears, clonazePAM, morphine injection, ondansetron **OR** ondansetron (ZOFRAN) IV, oxyCODONE, temazepam ? ? ?I have personally reviewed following labs and imaging studies ? ?LABORATORY DATA: ?CBC: ?Recent Labs  ?Lab 10/20/21 ?0852 10/21/21 ?0320 10/22/21 ?[id number] 10/23/21 ?0038  ?WBC 16.8* 15.5* 11.1* 12.8*  ?NEUTROABS  --   --  8.8*  --   ?HGB 14.4 13.3 12.8* 13.3  ?HCT 44.6 40.4 38.5* 40.0  ?MCV 92.3 91.4 90.4 91.7  ?PLT 688* 614* 507* 610*  ? ? ? ?Basic Metabolic Panel: ?Recent Labs  ?Lab 10/20/21 ?0852 10/21/21 ?0320 10/22/21 ?[id number] 10/23/21 ?0038  ?NA 132* 129* 134* 131*  ?K 3.8 3.7 3.5 3.4*  ?CL 97* 100 105 102  ?CO2 25 23 22  21*  ?GLUCOSE 153* 131* 100* 114*  ?BUN 5* 8 7* 5*  ?CREATININE 0.96 0.79 0.75 0.66  ?CALCIUM 9.6 8.7* 8.4* 8.6*  ? ? ? ?GFR: ?Estimated Creatinine Clearance: 81.1 mL/min (by C-G formula based on SCr of 0.66 mg/dL). ? ?Liver Function Tests: ?Recent Labs  ?Lab 10/20/21 ?0852 10/21/21 ?0320  ?AST 21 18  ?ALT 12 8  ?ALKPHOS 56 51  ?BILITOT 0.8 0.6  ?PROT 7.5 6.2*  ?ALBUMIN 3.1* 2.6*  ? ? ?Recent Labs   ?Lab 10/20/21 ?[id number]  ?LIPASE 28  ? ? ?No results for input(s): AMMONIA in the last 168 hours. ? ?Coagulation Profile: ?No results for input(s): INR, PROTIME in the last 168 hours. ? ?Cardiac Enzymes: ?No results for input(s): CKTOTAL, CKMB, CKMBINDEX, TROPONINI in the last 168 hours. ? ?BNP (last 3 results) ?No results for input(s): PROBNP in the last 8760 hours. ? ?Lipid Profile: ?No results for input(s): CHOL, HDL, LDLCALC, TRIG, CHOLHDL, LDLDIRECT in the last 72 hours. ? ?Thyroid Function Tests: ?No results for input(s): TSH, T4TOTAL, FREET4, T3FREE, THYROIDAB in the last 72 hours. ? ?  Anemia Panel: ?No results for input(s): VITAMINB12, FOLATE, FERRITIN, TIBC, IRON, RETICCTPCT in the last 72 hours. ? ?Urine analysis: ?   ?Component Value Date/Time  ? [place] YELLOW 10/20/2021 0855  ? APPEARANCEUR HAZY (A) 10/20/2021 0855  ? LABSPEC 1.012 10/20/2021 0855  ? PHURINE 6.0 10/20/2021 0855  ? [place] NEGATIVE 10/20/2021 0855  ? [place] NEGATIVE 10/20/2021 0855  ? [place] NEGATIVE 10/20/2021 0855  ? [place] NEGATIVE 10/20/2021 0855  ? [place] NEGATIVE 10/20/2021 0855  ? UROBILINOGEN 1.0 04/11/2007 1627  ? NITRITE NEGATIVE 10/20/2021 0855  ? LEUKOCYTESUR NEGATIVE 10/20/2021 0855  ? ? ?Sepsis Labs: ?Lactic Acid, Venous ?No results found for: LATICACIDVEN ? ?MICROBIOLOGY: ?No results found for this or any previous visit (from the past 240 hour(s)). ? ?RADIOLOGY STUDIES/RESULTS: ?No results found. ? ? LOS: 3 days  ? ?Oren Binet, MD  ?[facility] ? ? ? ?To contact the attending provider between 7A-7P or the covering provider during after hours 7P-7A, please log into the web site [email or web address] and access using universal [place] password for that web site. If you do not have the password, please call the hospital operator. ? ?10/23/2021, 11:17 AM ? ?  ?

## 2021-10-23 NOTE — Progress Notes (Signed)
? ? ?  Assessment & Plan: ?Diverticulitis with perforation, abscess  ?- IR unable to access abscess for drain placement after initial scan ?- WBC 12.8, slight increase on IV Zosyn ?            - clear liquid diet ?            - encouraged OOB, ambulation ?- no current indication for emergency surgery ?- plan repeat CT scan tomorrow ? ?      Darnell Level, MD ?      [facility] ?      Office: [phone] ? ? ?Chief Complaint: ?Perforated diverticulitis with abscess ? ?Subjective: ?Patient in bed, comfortable.  Less pain.  Denies BM's.  Tolerating clear liquid diet. ? ?Objective: ?Vital signs in last 24 hours: ?Temp:  [97.8 ?F (36.6 ?C)-98.5 ?F (36.9 ?C)] 98.5 ?F (36.9 ?C) (04/16 0092) ?Pulse Rate:  [71-89] 77 (04/16 0804) ?Resp:  [18] 18 (04/16 0307) ?BP: (102-138)/(58-84) 115/76 (04/16 0804) ?SpO2:  [89 %-93 %] 93 % (04/16 0804) ?Weight:  [82.7 kg] 82.7 kg (04/16 0837) ?Last BM Date : 10/15/21 ? ?Intake/Output from previous day: ?04/15 0701 - 04/16 0700 ?In: 1570 [P.O.:720; I.V.:800; IV Piggyback:50] ?Out: -  ?Intake/Output this shift: ?No intake/output data recorded. ? ?Physical Exam: ?HEENT - sclerae clear, mucous membranes moist ?Abdomen - mild distension, firm; BS present; non-tender ?Neuro - alert & oriented, no focal deficits ? ?Lab Results:  ?Recent Labs  ?  10/22/21 ?3300 10/23/21 ?0038  ?WBC 11.1* 12.8*  ?HGB 12.8* 13.3  ?HCT 38.5* 40.0  ?PLT 507* 610*  ? ?BMET ?Recent Labs  ?  10/22/21 ?7622 10/23/21 ?0038  ?NA 134* 131*  ?K 3.5 3.4*  ?CL 105 102  ?CO2 22 21*  ?GLUCOSE 100* 114*  ?BUN 7* 5*  ?CREATININE 0.75 0.66  ?CALCIUM 8.4* 8.6*  ? ?PT/INR ?No results for input(s): LABPROT, INR in the last 72 hours. ?Comprehensive Metabolic Panel: ?   ?Component Value Date/Time  ? NA 131 (L) 10/23/2021 0038  ? NA 134 (L) 10/22/2021 0055  ? NA 137 01/19/2021 0000  ? K 3.4 (L) 10/23/2021 0038  ? K 3.5 10/22/2021 0055  ? CL 102 10/23/2021 0038  ? CL 105 10/22/2021 0055  ? CO2 21 (L) 10/23/2021 0038  ? CO2  22 10/22/2021 0055  ? BUN 5 (L) 10/23/2021 0038  ? BUN 7 (L) 10/22/2021 0055  ? BUN 6 01/19/2021 0000  ? CREATININE 0.66 10/23/2021 0038  ? CREATININE 0.75 10/22/2021 0055  ? CREATININE 1.17 04/13/2016 1422  ? GLUCOSE 114 (H) 10/23/2021 0038  ? GLUCOSE 100 (H) 10/22/2021 0055  ? CALCIUM 8.6 (L) 10/23/2021 0038  ? CALCIUM 8.4 (L) 10/22/2021 0055  ? AST 18 10/21/2021 0320  ? AST 21 10/20/2021 0852  ? ALT 8 10/21/2021 0320  ? ALT 12 10/20/2021 0852  ? ALKPHOS 51 10/21/2021 0320  ? ALKPHOS 56 10/20/2021 0852  ? BILITOT 0.6 10/21/2021 0320  ? BILITOT 0.8 10/20/2021 0852  ? PROT 6.2 (L) 10/21/2021 0320  ? PROT 7.5 10/20/2021 0852  ? ALBUMIN 2.6 (L) 10/21/2021 0320  ? ALBUMIN 3.1 (L) 10/20/2021 6333  ? ? ?Studies/Results: ?No results found. ? ? ? ?Darnell Level ?10/23/2021 ? ? Patient ID: Robert Barrett, male   DOB: [birth year], 76 y.o.   MRN: [id number] ? ?

## 2021-10-23 NOTE — Care Management (Signed)
?  Transition of Care (TOC) Screening Note ? ? ?Patient Details  ?Name: Robert Barrett ?Date of Birth: 10/23/1945 ? ? ?Transition of Care (TOC) CM/SW Contact:    ?Carles Collet, RN ?Phone Number: ?10/23/2021, 11:43 AM ? ? ? ?Transition of Care Department [facility]) has reviewed patient and no TOC needs have been identified at this time. We will continue to monitor patient advancement through interdisciplinary progression rounds. If new patient transition needs arise, please place a TOC consult. ?  ? ?From home w spouse. abdominal pain-found to have perforated diverticulitis with abscess. No HH history.  ?

## 2021-10-23 NOTE — Progress Notes (Signed)
Pharmacy Antibiotic Note ? ?Robert Barrett is a 76 y.o. male admitted on 10/20/2021 with  intra-abdominal infection .  Pharmacy has been consulted for Zosyn dosing. Patient has been seeing surgery for  diverticulitis with perforation and abscess which IR has been unable to access for drain placement. No current indication for emergency surgery. ? ?WBC down-trending, SCr WNL ? ?Plan: ?-Zosyn 3.375 gm IV Q 8 hours (EI infusion) ?-Monitor CBC, renal fx, cultures and clinical progress ?-Follow-up LOT since abscess was not drained ? ?Height: 5\' 10"  (177.8 cm) ?Weight: 82.7 kg (182 lb 5.1 oz) ?IBW/kg (Calculated) : 73 ? ?Temp (24hrs), Avg:98.2 ?F (36.8 ?C), Min:97.8 ?F (36.6 ?C), Max:98.5 ?F (36.9 ?C) ? ?Recent Labs  ?Lab 10/20/21 ?0852 10/21/21 ?0320 10/22/21 ?10/24/21 10/23/21 ?0038  ?WBC 16.8* 15.5* 11.1* 12.8*  ?CREATININE 0.96 0.79 0.75 0.66  ? ?  ?Estimated Creatinine Clearance: 81.1 mL/min (by C-G formula based on SCr of 0.66 mg/dL).   ? ?No Known Allergies ? ?Antimicrobials this admission: ?Zosyn 4/13 >>  ? ?Dose adjustments this admission: ?None. ? ?Microbiology results: ?No cultures collected. ? ?Thank you for allowing pharmacy to be a part of this patient?s care. ? ?5/13, PharmD ?PGY1 Pharmacy Resident ? ?Please check AMION for all [facility] pharmacy phone numbers ?After 10:00 PM call main pharmacy [phone] ? ? ? ?

## 2021-10-23 NOTE — Evaluation (Signed)
Physical Therapy Evaluation and Discharge ?Patient Details ?Name: Robert Barrett ?MRN: [id number] ?DOB: 1945-09-18 ?Today's Date: 10/23/2021 ? ?History of Present Illness ? 77 y.o. male presented to the hospital 10/20/21 with abdominal pain. CT scan of his abdomen revealed perforated diverticulitis with a 6 cm abscess.    PMH significant of DM-2, HTN, HLD, CAD s/p CABG  ?Clinical Impression ?  ?Patient evaluated by Physical Therapy with no further acute PT needs identified. Patient able to walk 350 ft independently (except for assist with IV pole and telemetry). All education has been completed and the patient has no further questions.  PT is signing off. Thank you for this referral. ?   ?   ? ?Recommendations for follow up therapy are one component of a multi-disciplinary discharge planning process, led by the attending physician.  Recommendations may be updated based on patient status, additional functional criteria and insurance authorization. ? ?Follow Up Recommendations No PT follow up ? ?  ?Assistance Recommended at Discharge None  ?Patient can return home with the following ?   ? ?  ?Equipment Recommendations None recommended by PT  ?Recommendations for Other Services ?    ?  ?Functional Status Assessment Patient has not had a recent decline in their functional status  ? ?  ?Precautions / Restrictions Precautions ?Precautions: None  ? ?  ? ?Mobility ? Bed Mobility ?Overal bed mobility: Independent ?  ?  ?  ?  ?  ?  ?  ?  ? ?Transfers ?Overall transfer level: Independent ?  ?  ?  ?  ?  ?  ?  ?  ?  ?  ? ?Ambulation/Gait ?Ambulation/Gait assistance: Independent ?Gait Distance (Feet): 350 Feet ?Assistive device: None ?Gait Pattern/deviations: WFL(Within Functional Limits) ?  ?Gait velocity interpretation: >2.62 ft/sec, indicative of community ambulatory ?  ?  ? ?Stairs ?  ?  ?  ?  ?  ? ?Wheelchair Mobility ?  ? ?Modified Rankin (Stroke Patients Only) ?  ? ?  ? ?Balance Overall balance assessment: Independent ?  ?   ?  ?  ?  ?  ?  ?  ?  ?  ?  ?  ?  ?  ?  ?  ?  ?  ?   ? ? ? ?Pertinent Vitals/Pain Pain Assessment ?Pain Assessment: 0-10 ?Pain Score: 6  ?Pain Location: abd ?Pain Descriptors / Indicators: Tightness ?Pain Intervention(s): Limited activity within patient's tolerance, Monitored during session  ? ? ?Home Living Family/patient expects to be discharged to:: Private residence ?Living Arrangements: Spouse/significant other ?Available Help at Discharge: Family ?Type of Home: House ?Home Access: Stairs to enter ?Entrance Stairs-Rails: None ?Entrance Stairs-Number of Steps: 1 ?  ?Home Layout: One level ?  ?   ?  ?Prior Function Prior Level of Function : Independent/Modified Independent;Driving ?  ?  ?  ?  ?  ?  ?  ?  ?  ? ? ?Hand Dominance  ?   ? ?  ?Extremity/Trunk Assessment  ? Upper Extremity Assessment ?Upper Extremity Assessment: Overall WFL for tasks assessed ?  ? ?Lower Extremity Assessment ?Lower Extremity Assessment: Overall WFL for tasks assessed ?  ? ?Cervical / Trunk Assessment ?Cervical / Trunk Assessment: Normal  ?Communication  ? Communication: No difficulties  ?Cognition Arousal/Alertness: Awake/alert ?Behavior During Therapy: [facility] for tasks assessed/performed ?Overall Cognitive Status: Within Functional Limits for tasks assessed ?  ?  ?  ?  ?  ?  ?  ?  ?  ?  ?  ?  ?  ?  ?  ?  ?  ?  ?  ? ?  ?  General Comments General comments (skin integrity, edema, etc.): VSS per telemetry and pulse ox. ? ?  ?Exercises    ? ?Assessment/Plan  ?  ?PT Assessment Patient does not need any further PT services  ?PT Problem List   ? ?   ?  ?PT Treatment Interventions     ? ?PT Goals (Current goals can be found in the Care Plan section)  ?Acute Rehab PT Goals ?PT Goal Formulation: All assessment and education complete, DC therapy ? ?  ?Frequency   ?  ? ? ?Co-evaluation   ?  ?  ?  ?  ? ? ?  ?AM-PAC PT "6 Clicks" Mobility  ?Outcome Measure Help needed turning from your back to your side while in a flat bed without using bedrails?:  None ?Help needed moving from lying on your back to sitting on the side of a flat bed without using bedrails?: None ?Help needed moving to and from a bed to a chair (including a wheelchair)?: None ?Help needed standing up from a chair using your arms (e.g., wheelchair or bedside chair)?: None ?Help needed to walk in hospital room?: None ?Help needed climbing 3-5 steps with a railing? : None ?6 Click Score: 24 ? ?  ?End of Session   ?Activity Tolerance: Patient tolerated treatment well ?Patient left: in bed;with call bell/phone within reach;with family/visitor present ?Nurse Communication: Mobility status;Other (comment) (no further PT needed) ?PT Visit Diagnosis: Difficulty in walking, not elsewhere classified (R26.2) ?  ? ?Time: 1194-1740 ?PT Time Calculation (min) (ACUTE ONLY): 11 min ? ? ?Charges:   PT Evaluation ?$PT Eval Low Complexity: 1 Low ?  ?  ?   ? ? ? ?Jerolyn Center, PT ?Acute Rehabilitation Services  ?Pager [phone] ?Office [phone] ? ? ?[name] ?10/23/2021, 3:04 PM ? ?

## 2021-10-24 DIAGNOSIS — K219 Gastro-esophageal reflux disease without esophagitis: Secondary | ICD-10-CM | POA: Diagnosis not present

## 2021-10-24 DIAGNOSIS — K5720 Diverticulitis of large intestine with perforation and abscess without bleeding: Secondary | ICD-10-CM | POA: Diagnosis not present

## 2021-10-24 DIAGNOSIS — F419 Anxiety disorder, unspecified: Secondary | ICD-10-CM | POA: Diagnosis not present

## 2021-10-24 DIAGNOSIS — I10 Essential (primary) hypertension: Secondary | ICD-10-CM | POA: Diagnosis not present

## 2021-10-24 LAB — CBC
HCT: 40 % (ref 39.0–52.0)
Hemoglobin: 13.3 g/dL (ref 13.0–17.0)
MCH: 30.3 pg (ref 26.0–34.0)
MCHC: 33.3 g/dL (ref 30.0–36.0)
MCV: 91.1 fL (ref 80.0–100.0)
Platelets: 588 10*3/uL — ABNORMAL HIGH (ref 150–400)
RBC: 4.39 MIL/uL (ref 4.22–5.81)
RDW: 13.3 % (ref 11.5–15.5)
WBC: 14.2 10*3/uL — ABNORMAL HIGH (ref 4.0–10.5)
nRBC: 0 % (ref 0.0–0.2)

## 2021-10-24 LAB — GLUCOSE, CAPILLARY
Glucose-Capillary: 122 mg/dL — ABNORMAL HIGH (ref 70–99)
Glucose-Capillary: 168 mg/dL — ABNORMAL HIGH (ref 70–99)
Glucose-Capillary: 117 mg/dL — ABNORMAL HIGH (ref 70–99)
Glucose-Capillary: 120 mg/dL — ABNORMAL HIGH (ref 70–99)

## 2021-10-24 LAB — BASIC METABOLIC PANEL
Anion gap: 5 (ref 5–15)
BUN: 5 mg/dL — ABNORMAL LOW (ref 8–23)
CO2: 24 mmol/L (ref 22–32)
Calcium: 9 mg/dL (ref 8.9–10.3)
Chloride: 107 mmol/L (ref 98–111)
Creatinine, Ser: 0.7 mg/dL (ref 0.61–1.24)
GFR, Estimated: >60 mL/min (ref >60)
Glucose, Bld: 119 mg/dL — ABNORMAL HIGH (ref 70–99)
Potassium: 4.5 mmol/L (ref 3.5–5.1)
Sodium: 136 mmol/L (ref 135–145)

## 2021-10-24 LAB — MAGNESIUM: Magnesium: 1.5 mg/dL — ABNORMAL LOW (ref 1.7–2.4)

## 2021-10-24 MED ORDER — MAGNESIUM SULFATE 4 GM/100ML IV SOLN
4.0000 g | Freq: Once | INTRAVENOUS | Status: AC
  Administered 2021-10-24: 4 g via INTRAVENOUS
  Filled 2021-10-24: qty 100

## 2021-10-24 NOTE — Plan of Care (Signed)
Pt alert and oriented x 4. Med compliant. Up ad lib to bathroom with steady gait. Pt has had 2 doses of oxy this shift and one dose of tylenol. Pt also received restoril for sleep and standing order for mylanta ordered and given for indigestion. Pt reports prilosec works best at home. Made  note for dayshift nurse and informed pt to make sure to speak with provider in the am. BS x 4 and active. Abdomen remains tender.  ?Problem: Education: ?Goal: Knowledge of General Education information will improve ?Description: Including pain rating scale, medication(s)/side effects and non-pharmacologic comfort measures ?Outcome: Progressing ?  ?Problem: Health Behavior/Discharge Planning: ?Goal: Ability to manage health-related needs will improve ?Outcome: Progressing ?  ?Problem: Clinical Measurements: ?Goal: Ability to maintain clinical measurements within normal limits will improve ?Outcome: Progressing ?Goal: Will remain free from infection ?Outcome: Progressing ?Goal: Diagnostic test results will improve ?Outcome: Progressing ?Goal: Respiratory complications will improve ?Outcome: Progressing ?Goal: Cardiovascular complication will be avoided ?Outcome: Progressing ?  ?Problem: Activity: ?Goal: Risk for activity intolerance will decrease ?Outcome: Progressing ?  ?Problem: Nutrition: ?Goal: Adequate nutrition will be maintained ?Outcome: Progressing ?  ?Problem: Coping: ?Goal: Level of anxiety will decrease ?Outcome: Progressing ?  ?Problem: Elimination: ?Goal: Will not experience complications related to bowel motility ?Outcome: Progressing ?Goal: Will not experience complications related to urinary retention ?Outcome: Progressing ?  ?Problem: Pain Managment: ?Goal: General experience of comfort will improve ?Outcome: Progressing ?  ?Problem: Safety: ?Goal: Ability to remain free from injury will improve ?Outcome: Progressing ?  ?Problem: Skin Integrity: ?Goal: Risk for impaired skin integrity will decrease ?Outcome:  Progressing ?  ?

## 2021-10-24 NOTE — Progress Notes (Signed)
Pt refusing cpap.

## 2021-10-24 NOTE — Progress Notes (Signed)
[facility] ?Progress Note ? ?   ?Subjective: ?CC-  ?States that he feels a little better than yesterday. Pain and bloating slightly less. Passing more flatus but also burping. Denies n/v. Tolerating clear liquids. No BM. WBC up 14.2, VSS ? ?Objective: ?Vital signs in last 24 hours: ?Temp:  [97.8 ?F (36.6 ?C)-98.6 ?F (37 ?C)] 98.4 ?F (36.9 ?C) (04/17 0735) ?Pulse Rate:  [75-84] 75 (04/17 0348) ?Resp:  [16-18] 16 (04/17 0735) ?BP: (113-128)/(68-79) 125/79 (04/17 0735) ?SpO2:  [91 %-94 %] 94 % (04/17 0348) ?Last BM Date : 10/15/21 ? ?Intake/Output from previous day: ?04/16 0701 - 04/17 0700 ?In: 2505 [P.O.:840; I.V.:1515; IV Piggyback:150] ?Out: -  ?Intake/Output this shift: ?No intake/output data recorded. ? ?PE: ?Gen:  Alert, NAD, pleasant ?Abd: distended, somewhat firm, mild right sided TTP without rebound or guarding ? ?Lab Results:  ?Recent Labs  ?  10/23/21 ?8144 10/24/21 ?0324  ?WBC 12.8* 14.2*  ?HGB 13.3 13.3  ?HCT 40.0 40.0  ?PLT 610* 588*  ? ?BMET ?Recent Labs  ?  10/23/21 ?8185 10/24/21 ?0324  ?NA 131* 136  ?K 3.4* 4.5  ?CL 102 107  ?CO2 21* 24  ?GLUCOSE 114* 119*  ?BUN 5* 5*  ?CREATININE 0.66 0.70  ?CALCIUM 8.6* 9.0  ? ?PT/INR ?No results for input(s): LABPROT, INR in the last 72 hours. ?CMP  ?   ?Component Value Date/Time  ? NA 136 10/24/2021 0324  ? NA 137 01/19/2021 0000  ? K 4.5 10/24/2021 0324  ? CL 107 10/24/2021 0324  ? CO2 24 10/24/2021 0324  ? GLUCOSE 119 (H) 10/24/2021 0324  ? BUN 5 (L) 10/24/2021 0324  ? BUN 6 01/19/2021 0000  ? CREATININE 0.70 10/24/2021 0324  ? CREATININE 1.17 04/13/2016 1422  ? CALCIUM 9.0 10/24/2021 0324  ? PROT 6.2 (L) 10/21/2021 0320  ? ALBUMIN 2.6 (L) 10/21/2021 0320  ? AST 18 10/21/2021 0320  ? ALT 8 10/21/2021 0320  ? ALKPHOS 51 10/21/2021 0320  ? BILITOT 0.6 10/21/2021 0320  ? GFRNONAA >60 10/24/2021 0324  ? GFRNONAA 62 04/13/2016 1422  ? GFRAA 72 04/13/2016 1422  ? ?Lipase  ?   ?Component Value Date/Time  ? LIPASE 28 10/20/2021 0852   ? ? ? ? ? ?Studies/Results: ?No results found. ? ?Anti-infectives: ?Anti-infectives (From admission, onward)  ? ? Start     Dose/Rate Route Frequency Ordered Stop  ? 10/20/21 2300  piperacillin-tazobactam (ZOSYN) IVPB 3.375 g       ? 3.375 g ?12.5 mL/hr over 240 Minutes Intravenous Every 8 hours 10/20/21 1854    ? 10/20/21 1700  piperacillin-tazobactam (ZOSYN) IVPB 3.375 g       ? 3.375 g ?100 mL/hr over 30 Minutes Intravenous  Once 10/20/21 1656 10/20/21 1745  ? ?  ? ? ? ?Assessment/Plan ?Diverticulitis with perforation and 6cm abscess  ?- IR unable to drain fluid collection noted on CT scan 4/13 ?- WBC slightly up today 14.2, afebrile ?- Repeat CT scan today with PO contrast as tolerated. Continue IV zosyn. Continue clear liquids. ?  ?FEN: CLD ?ID: zosyn 4/13> ?VTE: lovenox ?  ?I reviewed hospitalist notes, last 24 h vitals and pain scores, last 48 h intake and output, last 24 h labs and trends (CBC, BMP, magnesium), and CT scan from 4/13. ?  ?T2DM ?HTN ?HLD ?CAD s/p CABG ?GERD ?Anxiety ? ? ? LOS: 4 days  ? ? ?Franne Forts, PA-C ?[facility] ?10/24/2021, 10:48 AM ?Please see Amion for pager number during day  hours 7:00am-4:30pm ? ?

## 2021-10-24 NOTE — Progress Notes (Signed)
?      ?                 PROGRESS NOTE ? ?      ?PATIENT DETAILS ?Name: Robert Barrett E Tsuda ?Age: 76 y.o. ?Sex: male ?Date of Birth: 11/28/1945 ?Admit Date: 10/20/2021 ?Admitting Physician Maretta BeesShanker M Antowan Samford, MD ?YQM[name] is a 76 y.o.  male with past medical history of DM-2, HTN, HLD, CAD s/p CABG-presented with abdominal pain-found to have perforated diverticulitis with abscess. ? ?Significant events: ?4/13>> [facility] perforated diverticulitis with 6 cm abscess. ?4/14>> per IR-no appropriate window to place drain. ? ?Significant studies: ?4/13>> CT abdomen/pelvis: Perforated diverticulitis-6 cm abscess.  Upstream distention of large bowel-concerning for secondary large bowel obstruction. ? ?Significant microbiology data: ?None ? ?Procedures: ?None ? ?Consults: ?General surgery ? ?Subjective: ?Minimal abdominal pain-passing flatus but no BM since hospitalization. ? ?Objective: ?Vitals: ?Blood pressure 128/73, pulse 74, temperature 98.4 ?F (36.9 ?C), temperature source Oral, resp. rate 18, height 5\' 10"  (1.778 m), weight 82.7 kg, SpO2 94 %.  ? ?Exam: ?Gen Exam:Alert awake-not in any distress ?HEENT:atraumatic, normocephalic ?Chest: B/L clear to auscultation anteriorly ?CVS:S1S2 regular ?Abdomen:soft-slightly distended but nontender. ?Extremities:no edema ?Neurology: Non focal ?Skin: no rash  ? ?Pertinent Labs/Radiology: ? ?  Latest Ref Rng & Units 10/24/2021  ?  3:24 AM 10/23/2021  ? 12:38 AM 10/22/2021  ? 12:55 AM  ?CBC  ?WBC 4.0 - 10.5 K/uL 14.2   12.8   11.1    ?Hemoglobin 13.0 - 17.0 g/dL 62.913.3   52.813.3   41.312.8    ?Hematocrit 39.0 - 52.0 % 40.0   40.0   38.5    ?Platelets 150 - 400 K/uL 588   610   507    ?  ?Lab Results  ?Component Value Date  ? NA 136 10/24/2021  ? K 4.5 10/24/2021  ? CL 107 10/24/2021  ? CO2 24 10/24/2021  ? ?  ? ? ?Assessment/Plan: ?Perforated diverticulitis with 6 cm abscess: Abdominal pain has improved-continues to have mild leukocytosis-IR unable to  place drain-on Zosyn-General surgery repeating CT imaging today.   ? ??  Bowel obstruction: Seen on imaging-but clinically does not appear to have a bowel obstruction.  No BM but passing flatus.  Follow closely. ? ?Hyponatremia: Better-watch periodically. ? ?Hypokalemia: Repleted. ? ?DM-2 (A1c 6.0 on 4/14): Diet controlled at home-monitor with SSI.  ? ?Recent Labs  ?  10/23/21 ?2227 10/24/21 ?0737 10/24/21 ?1140  ?GLUCAP 182* 122* 168*  ? ?  ?  ?HTN: Stable-continue amlodipine, lisinopril and metoprolol.  Follow and adjust.   ? ?HLD: Continue fenofibrate/statin. ? ?CAD s/p CABG approximately 10 years ago: No anginal symptoms-on aspirin/statin/beta-blocker. ?  ?GERD: Continue PPI ?  ?Anxiety: Continue as needed Klonopin ?  ?Insomnia: Continue as needed temazepam ? ?OSA: CPAP nightly ? ?BMI: ?Estimated body mass index is 26.16 kg/m? as calculated from the following: ?  Height as of this encounter: 5\' 10"  (1.778 m). ?  Weight as of this encounter: 82.7 kg.  ? ?Code status: ?  Code Status: Full Code  ? ?DVT Prophylaxis: ?enoxaparin (LOVENOX) injection 40 mg Start: 10/21/21 1200 ?SCDs Start: 10/20/21 1800 ?Start prophylactic Lovenox 4/14 ?  ?Family Communication: Spouse-[phone]-updated over the phone on 4/16 ? ? ?Disposition Plan: ?Status is: Inpatient ?Remains inpatient appropriate because: Perforated diverticulitis with abscess-n.p.o.-on IVF/IV antibiotics-not yet stable for discharge. ?  ?Planned Discharge Destination:Home ? ? ?Diet: ?Diet Order   ? ?       ?  Diet clear liquid Room service appropriate? Yes; Fluid consistency: Thin  Diet effective now       ?  ? ?  ?  ? ?  ?  ? ? ?Antimicrobial agents: ?Anti-infectives (From admission, onward)  ? ? Start     Dose/Rate Route Frequency Ordered Stop  ? 10/20/21 2300  piperacillin-tazobactam (ZOSYN) IVPB 3.375 g       ? 3.375 g ?12.5 mL/hr over 240 Minutes Intravenous Every 8 hours 10/20/21 1854    ? 10/20/21 1700  piperacillin-tazobactam (ZOSYN) IVPB 3.375 g        ? 3.375 g ?100 mL/hr over 30 Minutes Intravenous  Once 10/20/21 1656 10/20/21 1745  ? ?  ? ? ? ?MEDICATIONS: ?Scheduled Meds: ? (feeding supplement) PROSource Plus  30 mL Oral BID BM  ? amLODipine  10 mg Oral Daily  ? aspirin EC  81 mg Oral Daily  ? enoxaparin (LOVENOX) injection  40 mg Subcutaneous Q24H  ? feeding supplement  1 Container Oral TID BM  ? fenofibrate  160 mg Oral Daily  ? fluticasone  2 spray Each Nare QHS  ? insulin aspart  0-9 Units Subcutaneous TID WC  ? lisinopril  40 mg Oral Daily  ? metoprolol tartrate  50 mg Oral BID  ? pantoprazole  40 mg Oral BID AC  ? polyethylene glycol  17 g Oral Daily  ? rosuvastatin  10 mg Oral Daily  ? ?Continuous Infusions: ? sodium chloride 75 mL/hr at 10/24/21 5366  ? piperacillin-tazobactam (ZOSYN)  IV 3.375 g (10/24/21 4403)  ? ?PRN Meds:.acetaminophen **OR** acetaminophen, albuterol, alum & mag hydroxide-simeth, artificial tears, clonazePAM, morphine injection, ondansetron **OR** ondansetron (ZOFRAN) IV, oxyCODONE, temazepam ? ? ?I have personally reviewed following labs and imaging studies ? ?LABORATORY DATA: ?CBC: ?Recent Labs  ?Lab 10/20/21 ?0852 10/21/21 ?0320 10/22/21 ?4742 10/23/21 ?5956 10/24/21 ?0324  ?WBC 16.8* 15.5* 11.1* 12.8* 14.2*  ?NEUTROABS  --   --  8.8*  --   --   ?HGB 14.4 13.3 12.8* 13.3 13.3  ?HCT 44.6 40.4 38.5* 40.0 40.0  ?MCV 92.3 91.4 90.4 91.7 91.1  ?PLT 688* 614* 507* 610* 588*  ? ? ? ?Basic Metabolic Panel: ?Recent Labs  ?Lab 10/20/21 ?0852 10/21/21 ?0320 10/22/21 ?3875 10/23/21 ?6433 10/24/21 ?0324  ?NA 132* 129* 134* 131* 136  ?K 3.8 3.7 3.5 3.4* 4.5  ?CL 97* 100 105 102 107  ?CO2 25 23 22  21* 24  ?GLUCOSE 153* 131* 100* 114* 119*  ?BUN 5* 8 7* 5* 5*  ?CREATININE 0.96 0.79 0.75 0.66 0.70  ?CALCIUM 9.6 8.7* 8.4* 8.6* 9.0  ?MG  --   --   --   --  1.5*  ? ? ? ?GFR: ?Estimated Creatinine Clearance: 81.1 mL/min (by C-G formula based on SCr of 0.7 mg/dL). ? ?Liver Function Tests: ?Recent Labs  ?Lab 10/20/21 ?0852 10/21/21 ?0320  ?AST 21  18  ?ALT 12 8  ?ALKPHOS 56 51  ?BILITOT 0.8 0.6  ?PROT 7.5 6.2*  ?ALBUMIN 3.1* 2.6*  ? ? ?Recent Labs  ?Lab 10/20/21 ?0852  ?LIPASE 28  ? ? ?No results for input(s): AMMONIA in the last 168 hours. ? ?Coagulation Profile: ?No results for input(s): INR, PROTIME in the last 168 hours. ? ?Cardiac Enzymes: ?No results for input(s): CKTOTAL, CKMB, CKMBINDEX, TROPONINI in the last 168 hours. ? ?BNP (last 3 results) ?No results for input(s): PROBNP in the last 8760 hours. ? ?Lipid Profile: ?No results for input(s): CHOL, HDL, LDLCALC, TRIG, CHOLHDL, LDLDIRECT  in the last 72 hours. ? ?Thyroid Function Tests: ?No results for input(s): TSH, T4TOTAL, FREET4, T3FREE, THYROIDAB in the last 72 hours. ? ?Anemia Panel: ?No results for input(s): VITAMINB12, FOLATE, FERRITIN, TIBC, IRON, RETICCTPCT in the last 72 hours. ? ?Urine analysis: ?   ?Component Value Date/Time  ? COLORURINE YELLOW 10/20/2021 0855  ? APPEARANCEUR HAZY (A) 10/20/2021 0855  ? LABSPEC 1.012 10/20/2021 0855  ? PHURINE 6.0 10/20/2021 0855  ? GLUCOSEU NEGATIVE 10/20/2021 0855  ? HGBUR NEGATIVE 10/20/2021 0855  ? BILIRUBINUR NEGATIVE 10/20/2021 0855  ? KETONESUR NEGATIVE 10/20/2021 0855  ? PROTEINUR NEGATIVE 10/20/2021 0855  ? UROBILINOGEN 1.0 04/11/2007 1627  ? NITRITE NEGATIVE 10/20/2021 0855  ? LEUKOCYTESUR NEGATIVE 10/20/2021 0855  ? ? ?Sepsis Labs: ?Lactic Acid, Venous ?No results found for: LATICACIDVEN ? ?MICROBIOLOGY: ?No results found for this or any previous visit (from the past 240 hour(s)). ? ?RADIOLOGY STUDIES/RESULTS: ?No results found. ? ? LOS: 4 days  ? ?Jeoffrey Massed, MD  ?[facility] ? ? ? ?To contact the attending provider between 7A-7P or the covering provider during after hours 7P-7A, please log into the web site [email or web address] and access using universal [place] password for that web site. If you do not have the password, please call the hospital operator. ? ?10/24/2021, 12:24 PM ? ?  ?

## 2021-10-25 ENCOUNTER — Inpatient Hospital Stay (HOSPITAL_COMMUNITY): Payer: No Typology Code available for payment source | Admitting: Anesthesiology

## 2021-10-25 ENCOUNTER — Encounter (HOSPITAL_COMMUNITY): Payer: Self-pay | Admitting: Internal Medicine

## 2021-10-25 ENCOUNTER — Encounter (HOSPITAL_COMMUNITY)
Admission: EM | Disposition: A | Discharge status: Home Health | Payer: Self-pay | Source: Home / Self Care | Attending: Internal Medicine

## 2021-10-25 ENCOUNTER — Inpatient Hospital Stay (HOSPITAL_COMMUNITY): Payer: No Typology Code available for payment source

## 2021-10-25 DIAGNOSIS — K219 Gastro-esophageal reflux disease without esophagitis: Secondary | ICD-10-CM | POA: Diagnosis not present

## 2021-10-25 DIAGNOSIS — K631 Perforation of intestine (nontraumatic): Secondary | ICD-10-CM

## 2021-10-25 DIAGNOSIS — I10 Essential (primary) hypertension: Secondary | ICD-10-CM | POA: Diagnosis not present

## 2021-10-25 DIAGNOSIS — E119 Type 2 diabetes mellitus without complications: Secondary | ICD-10-CM

## 2021-10-25 DIAGNOSIS — I2510 Atherosclerotic heart disease of native coronary artery without angina pectoris: Secondary | ICD-10-CM

## 2021-10-25 DIAGNOSIS — K5720 Diverticulitis of large intestine with perforation and abscess without bleeding: Secondary | ICD-10-CM | POA: Diagnosis not present

## 2021-10-25 DIAGNOSIS — F419 Anxiety disorder, unspecified: Secondary | ICD-10-CM | POA: Diagnosis not present

## 2021-10-25 HISTORY — PX: LAPAROTOMY: SHX154

## 2021-10-25 LAB — GLUCOSE, CAPILLARY
Glucose-Capillary: 153 mg/dL — ABNORMAL HIGH (ref 70–99)
Glucose-Capillary: 117 mg/dL — ABNORMAL HIGH (ref 70–99)
Glucose-Capillary: 154 mg/dL — ABNORMAL HIGH (ref 70–99)
Glucose-Capillary: 135 mg/dL — ABNORMAL HIGH (ref 70–99)
Glucose-Capillary: 113 mg/dL — ABNORMAL HIGH (ref 70–99)

## 2021-10-25 LAB — CBC
HCT: 40.2 % (ref 39.0–52.0)
Hemoglobin: 13.2 g/dL (ref 13.0–17.0)
MCH: 29.4 pg (ref 26.0–34.0)
MCHC: 32.8 g/dL (ref 30.0–36.0)
MCV: 89.5 fL (ref 80.0–100.0)
Platelets: 613 10*3/uL — ABNORMAL HIGH (ref 150–400)
RBC: 4.49 MIL/uL (ref 4.22–5.81)
RDW: 13.4 % (ref 11.5–15.5)
WBC: 18.1 10*3/uL — ABNORMAL HIGH (ref 4.0–10.5)
nRBC: 0 % (ref 0.0–0.2)

## 2021-10-25 LAB — TYPE AND SCREEN
ABO/RH(D): O NEG
Antibody Screen: NEGATIVE

## 2021-10-25 LAB — BASIC METABOLIC PANEL
Anion gap: 10 (ref 5–15)
BUN: 5 mg/dL — ABNORMAL LOW (ref 8–23)
CO2: 19 mmol/L — ABNORMAL LOW (ref 22–32)
Calcium: 8.4 mg/dL — ABNORMAL LOW (ref 8.9–10.3)
Chloride: 102 mmol/L (ref 98–111)
Creatinine, Ser: 0.78 mg/dL (ref 0.61–1.24)
GFR, Estimated: >60 mL/min (ref >60)
Glucose, Bld: 176 mg/dL — ABNORMAL HIGH (ref 70–99)
Potassium: 3.4 mmol/L — ABNORMAL LOW (ref 3.5–5.1)
Sodium: 131 mmol/L — ABNORMAL LOW (ref 135–145)

## 2021-10-25 LAB — MAGNESIUM: Magnesium: 2.4 mg/dL (ref 1.7–2.4)

## 2021-10-25 SURGERY — LAPAROTOMY, EXPLORATORY
Anesthesia: General | Site: Abdomen

## 2021-10-25 MED ORDER — SUCCINYLCHOLINE CHLORIDE 200 MG/10ML IV SOSY
PREFILLED_SYRINGE | INTRAVENOUS | Status: DC | PRN
  Administered 2021-10-25: 140 mg via INTRAVENOUS

## 2021-10-25 MED ORDER — PHENYLEPHRINE 80 MCG/ML (10ML) SYRINGE FOR IV PUSH (FOR BLOOD PRESSURE SUPPORT)
PREFILLED_SYRINGE | INTRAVENOUS | Status: DC | PRN
  Administered 2021-10-25: 320 ug via INTRAVENOUS
  Administered 2021-10-25: 80 ug via INTRAVENOUS
  Administered 2021-10-25 (×2): 200 ug via INTRAVENOUS

## 2021-10-25 MED ORDER — ROCURONIUM BROMIDE 10 MG/ML (PF) SYRINGE
PREFILLED_SYRINGE | INTRAVENOUS | Status: DC | PRN
  Administered 2021-10-25: 10 mg via INTRAVENOUS
  Administered 2021-10-25: 50 mg via INTRAVENOUS

## 2021-10-25 MED ORDER — PHENYLEPHRINE HCL-NACL 20-0.9 MG/250ML-% IV SOLN
INTRAVENOUS | Status: DC | PRN
  Administered 2021-10-25: 50 ug/min via INTRAVENOUS

## 2021-10-25 MED ORDER — LIDOCAINE 2% (20 MG/ML) 5 ML SYRINGE
INTRAMUSCULAR | Status: DC | PRN
  Administered 2021-10-25: 60 mg via INTRAVENOUS

## 2021-10-25 MED ORDER — PROPOFOL 10 MG/ML IV BOLUS
INTRAVENOUS | Status: DC | PRN
  Administered 2021-10-25: 110 mg via INTRAVENOUS

## 2021-10-25 MED ORDER — CHLORHEXIDINE GLUCONATE 0.12 % MT SOLN
15.0000 mL | Freq: Once | OROMUCOSAL | Status: AC
  Administered 2021-10-25: 15 mL via OROMUCOSAL
  Filled 2021-10-25: qty 15

## 2021-10-25 MED ORDER — HYDROMORPHONE HCL 1 MG/ML IJ SOLN
INTRAMUSCULAR | Status: AC
  Filled 2021-10-25: qty 1

## 2021-10-25 MED ORDER — EPHEDRINE SULFATE-NACL 50-0.9 MG/10ML-% IV SOSY
PREFILLED_SYRINGE | INTRAVENOUS | Status: DC | PRN
  Administered 2021-10-25: 10 mg via INTRAVENOUS
  Administered 2021-10-25: 15 mg via INTRAVENOUS

## 2021-10-25 MED ORDER — POTASSIUM CHLORIDE CRYS ER 20 MEQ PO TBCR
40.0000 meq | EXTENDED_RELEASE_TABLET | Freq: Once | ORAL | Status: AC
Start: 2021-10-25 — End: 2021-10-25
  Administered 2021-10-25: 40 meq via ORAL
  Filled 2021-10-25: qty 2

## 2021-10-25 MED ORDER — MIDAZOLAM HCL 2 MG/2ML IJ SOLN
INTRAMUSCULAR | Status: AC
  Filled 2021-10-25: qty 2

## 2021-10-25 MED ORDER — ORAL CARE MOUTH RINSE: 15.0000 mL | Freq: Once | OROMUCOSAL | Status: AC

## 2021-10-25 MED ORDER — LACTATED RINGERS IV SOLN: INTRAVENOUS | Status: DC | PRN

## 2021-10-25 MED ORDER — SUCCINYLCHOLINE CHLORIDE 200 MG/10ML IV SOSY
PREFILLED_SYRINGE | INTRAVENOUS | Status: AC
  Filled 2021-10-25: qty 10

## 2021-10-25 MED ORDER — 0.9 % SODIUM CHLORIDE (POUR BTL) OPTIME
TOPICAL | Status: DC | PRN
  Administered 2021-10-25 (×4): 1000 mL

## 2021-10-25 MED ORDER — FENTANYL CITRATE (PF) 250 MCG/5ML IJ SOLN
INTRAMUSCULAR | Status: DC | PRN
  Administered 2021-10-25: 50 ug via INTRAVENOUS
  Administered 2021-10-25: 100 ug via INTRAVENOUS
  Administered 2021-10-25: 50 ug via INTRAVENOUS

## 2021-10-25 MED ORDER — ONDANSETRON HCL 4 MG/2ML IJ SOLN
INTRAMUSCULAR | Status: AC
  Filled 2021-10-25: qty 2

## 2021-10-25 MED ORDER — FENTANYL CITRATE (PF) 250 MCG/5ML IJ SOLN
INTRAMUSCULAR | Status: AC
  Filled 2021-10-25: qty 5

## 2021-10-25 MED ORDER — DEXAMETHASONE SODIUM PHOSPHATE 10 MG/ML IJ SOLN
INTRAMUSCULAR | Status: DC | PRN
  Administered 2021-10-25: 5 mg via INTRAVENOUS

## 2021-10-25 MED ORDER — HYDROMORPHONE HCL 1 MG/ML IJ SOLN
0.2500 mg | INTRAMUSCULAR | Status: DC | PRN
  Administered 2021-10-25 (×2): 0.5 mg via INTRAVENOUS

## 2021-10-25 MED ORDER — SUGAMMADEX SODIUM 200 MG/2ML IV SOLN
INTRAVENOUS | Status: DC | PRN
  Administered 2021-10-25: 200 mg via INTRAVENOUS

## 2021-10-25 MED ORDER — MIDAZOLAM HCL 2 MG/2ML IJ SOLN
INTRAMUSCULAR | Status: DC | PRN
  Administered 2021-10-25: 2 mg via INTRAVENOUS

## 2021-10-25 MED ORDER — IOHEXOL 9 MG/ML PO SOLN
ORAL | Status: AC
  Administered 2021-10-25: 500 mL
  Filled 2021-10-25: qty 1000

## 2021-10-25 MED ORDER — ONDANSETRON HCL 4 MG/2ML IJ SOLN
INTRAMUSCULAR | Status: DC | PRN
  Administered 2021-10-25: 4 mg via INTRAVENOUS

## 2021-10-25 MED ORDER — PHENYLEPHRINE 80 MCG/ML (10ML) SYRINGE FOR IV PUSH (FOR BLOOD PRESSURE SUPPORT)
PREFILLED_SYRINGE | INTRAVENOUS | Status: AC
  Filled 2021-10-25: qty 10

## 2021-10-25 MED ORDER — LACTATED RINGERS IV SOLN: INTRAVENOUS | Status: DC

## 2021-10-25 MED ORDER — ALBUMIN HUMAN 5 % IV SOLN: INTRAVENOUS | Status: DC | PRN

## 2021-10-25 MED ORDER — IOHEXOL 300 MG/ML  SOLN
100.0000 mL | Freq: Once | INTRAMUSCULAR | Status: AC | PRN
  Administered 2021-10-25: 100 mL via INTRAVENOUS

## 2021-10-25 MED ORDER — EPHEDRINE 5 MG/ML INJ
INTRAVENOUS | Status: AC
  Filled 2021-10-25: qty 10

## 2021-10-25 MED ORDER — PHENYLEPHRINE HCL (PRESSORS) 10 MG/ML IV SOLN: INTRAVENOUS | Status: DC | PRN

## 2021-10-25 MED ORDER — LIDOCAINE 2% (20 MG/ML) 5 ML SYRINGE
INTRAMUSCULAR | Status: AC
  Filled 2021-10-25: qty 5

## 2021-10-25 SURGICAL SUPPLY — 54 items
BAG COUNTER SPONGE SURGICOUNT (BAG) ×3 IMPLANT
BLADE CLIPPER SURG (BLADE) ×1 IMPLANT
BNDG GAUZE ELAST 4 BULKY (GAUZE/BANDAGES/DRESSINGS) ×1 IMPLANT
CANISTER SUCT 3000ML PPV (MISCELLANEOUS) ×2 IMPLANT
CHLORAPREP W/TINT 26 (MISCELLANEOUS) ×2 IMPLANT
COVER SURGICAL LIGHT HANDLE (MISCELLANEOUS) ×2 IMPLANT
DRAIN CHANNEL 19F RND (DRAIN) ×1 IMPLANT
DRAPE LAPAROSCOPIC ABDOMINAL (DRAPES) ×2 IMPLANT
DRAPE WARM FLUID 44X44 (DRAPES) ×2 IMPLANT
DRSG OPSITE POSTOP 4X10 (GAUZE/BANDAGES/DRESSINGS) IMPLANT
DRSG OPSITE POSTOP 4X8 (GAUZE/BANDAGES/DRESSINGS) IMPLANT
ELECT BLADE 6.5 EXT (BLADE) ×1 IMPLANT
ELECT CAUTERY BLADE 6.4 (BLADE) ×1 IMPLANT
ELECT REM PT RETURN 9FT ADLT (ELECTROSURGICAL) ×2
ELECTRODE REM PT RTRN 9FT ADLT (ELECTROSURGICAL) ×1 IMPLANT
EVACUATOR SILICONE 100CC (DRAIN) ×1 IMPLANT
GAUZE PAD ABD 8X10 STRL (GAUZE/BANDAGES/DRESSINGS) ×2 IMPLANT
GAUZE SPONGE 4X4 12PLY STRL LF (GAUZE/BANDAGES/DRESSINGS) ×1 IMPLANT
GLOVE BIO SURGEON STRL SZ 6 (GLOVE) ×2 IMPLANT
GLOVE INDICATOR 6.5 STRL GRN (GLOVE) ×2 IMPLANT
GOWN STRL REUS W/ TWL LRG LVL3 (GOWN DISPOSABLE) ×2 IMPLANT
GOWN STRL REUS W/TWL LRG LVL3 (GOWN DISPOSABLE) ×4
HANDLE SUCTION POOLE (INSTRUMENTS) ×1 IMPLANT
KIT BASIN OR (CUSTOM PROCEDURE TRAY) ×2 IMPLANT
KIT COLOSTOMY ILEOSTOMY 4 (WOUND CARE) ×1 IMPLANT
KIT TURNOVER KIT B (KITS) ×2 IMPLANT
LIGASURE IMPACT 36 18CM CVD LR (INSTRUMENTS) ×1 IMPLANT
NS IRRIG 1000ML POUR BTL (IV SOLUTION) ×4 IMPLANT
PACK GENERAL/GYN (CUSTOM PROCEDURE TRAY) ×2 IMPLANT
PAD ARMBOARD 7.5X6 YLW CONV (MISCELLANEOUS) ×2 IMPLANT
PENCIL SMOKE EVACUATOR (MISCELLANEOUS) ×2 IMPLANT
RELOAD GRN CONTOUR (ENDOMECHANICALS) ×2 IMPLANT
RELOAD PROXIMATE 75MM BLUE (ENDOMECHANICALS) ×6 IMPLANT
RELOAD STAPLE 40 GRN THCK (ENDOMECHANICALS) IMPLANT
RELOAD STAPLE 75 3.8 BLU REG (ENDOMECHANICALS) IMPLANT
SPECIMEN JAR LARGE (MISCELLANEOUS) IMPLANT
SPONGE T-LAP 18X18 ~~LOC~~+RFID (SPONGE) ×4 IMPLANT
STAPLER CVD CUT GN 40 RELOAD (ENDOMECHANICALS) ×4 IMPLANT
STAPLER CVD CUT GRN 40 RELOAD (ENDOMECHANICALS) IMPLANT
STAPLER PROXIMATE 75MM BLUE (STAPLE) ×1 IMPLANT
STAPLER VISISTAT 35W (STAPLE) ×1 IMPLANT
SUCTION POOLE HANDLE (INSTRUMENTS) ×4
SUT ETHILON 2 0 FS 18 (SUTURE) ×1 IMPLANT
SUT PDS AB 1 TP1 96 (SUTURE) ×4 IMPLANT
SUT PROLENE 2 0 CT2 30 (SUTURE) ×1 IMPLANT
SUT SILK 2 0 SH CR/8 (SUTURE) ×3 IMPLANT
SUT SILK 2 0 TIES 10X30 (SUTURE) ×2 IMPLANT
SUT SILK 3 0 SH CR/8 (SUTURE) ×2 IMPLANT
SUT SILK 3 0 TIES 10X30 (SUTURE) ×2 IMPLANT
SUT VIC AB 3-0 SH 18 (SUTURE) ×1 IMPLANT
SUT VIC AB 3-0 SH 27 (SUTURE) ×4
SUT VIC AB 3-0 SH 27XBRD (SUTURE) IMPLANT
TOWEL GREEN STERILE (TOWEL DISPOSABLE) ×2 IMPLANT
TRAY FOLEY MTR SLVR 16FR STAT (SET/KITS/TRAYS/PACK) ×1 IMPLANT

## 2021-10-25 NOTE — Progress Notes (Signed)
?      ?                 PROGRESS NOTE ? ?      ?PATIENT DETAILS ?Name: Robert Barrett ?Age: 76 y.o. ?Sex: male ?Date of Birth: 1945/12/30 ?Admit Date: 10/20/2021 ?Admitting Physician Maretta Bees, MD ?[name] is a 76 y.o.  male with past medical history of DM-2, HTN, HLD, CAD s/p CABG-presented with abdominal pain-found to have perforated diverticulitis with abscess. ? ?Significant events: ?4/13>> admit to [facility] for perforated diverticulitis with 6 cm abscess. ?4/14>> per IR-no appropriate window to place drain. ? ?Significant studies: ?4/13>> CT abdomen/pelvis: Perforated diverticulitis-6 cm abscess.  Upstream distention of large bowel-concerning for secondary large bowel obstruction. ? ?Significant microbiology data: ?None ? ?Procedures: ?None ? ?Consults: ?General surgery ? ?Subjective: ?Minimal abdominal pain-had a BM today. ? ?Objective: ?Vitals: ?Blood pressure 113/66, pulse 75, temperature 98 ?F (36.7 ?C), temperature source Oral, resp. rate 17, height 5\' 10"  (1.778 m), weight 82.7 kg, SpO2 95 %.  ? ?Exam: ?Gen Exam:Alert awake-not in any distress ?HEENT:atraumatic, normocephalic ?Chest: B/L clear to auscultation anteriorly ?CVS:S1S2 regular ?Abdomen:soft tender in the lower abdomen-slightly distended.   ?Extremities:no edema ?Neurology: Non focal ?Skin: no rash  ? ?Pertinent Labs/Radiology: ? ?  Latest Ref Rng & Units 10/25/2021  ?  1:46 AM 10/24/2021  ?  3:24 AM 10/23/2021  ? 12:38 AM  ?CBC  ?WBC 4.0 - 10.5 K/uL 18.1   14.2   12.8    ?Hemoglobin 13.0 - 17.0 g/dL 10/25/2021   06.2   37.6    ?Hematocrit 39.0 - 52.0 % 40.2   40.0   40.0    ?Platelets 150 - 400 K/uL 613   588   610    ?  ?Lab Results  ?Component Value Date  ? NA 131 (L) 10/25/2021  ? K 3.4 (L) 10/25/2021  ? CL 102 10/25/2021  ? CO2 19 (L) 10/25/2021  ? ?  ? ? ?Assessment/Plan: ?Perforated diverticulitis with 6 cm abscess: Worsening leukocytosis-abdominal pain has improved-IR unable to place drain-CT scan  abdomen being repeated today.  Remains on Zosyn-General surgery following.   ? ??  Bowel obstruction: Seen on imaging-but clinically does not appear to have a bowel obstruction.  Finally had a BM after several days.  Watch closely. ? ?Hyponatremia: Better-watch periodically. ? ?Hypokalemia: Replete and recheck. ? ?DM-2 (A1c 6.0 on 4/14): Diet controlled at home-monitor with SSI.  ? ?Recent Labs  ?  10/24/21 ?1540 10/24/21 ?2020 10/25/21 ?0735  ?GLUCAP 117* 120* 153*  ? ?  ?  ?HTN: Stable-continue amlodipine, lisinopril and metoprolol.  Follow and adjust.   ? ?HLD: Continue fenofibrate/statin. ? ?CAD s/p CABG approximately 10 years ago: No anginal symptoms-on aspirin/statin/beta-blocker. ?  ?GERD: Continue PPI ?  ?Anxiety: Continue as needed Klonopin ?  ?Insomnia: Continue as needed temazepam ? ?OSA: CPAP nightly ? ?BMI: ?Estimated body mass index is 26.16 kg/m? as calculated from the following: ?  Height as of this encounter: 5\' 10"  (1.778 m). ?  Weight as of this encounter: 82.7 kg.  ? ?Code status: ?  Code Status: Full Code  ? ?DVT Prophylaxis: ?enoxaparin (LOVENOX) injection 40 mg Start: 10/21/21 1200 ?SCDs Start: 10/20/21 1800 ?Start prophylactic Lovenox 4/14 ?  ?Family Communication: Spouse-Pamela-[phone]-updated over the phone on 4/18 ? ? ?Disposition Plan: ?Status is: Inpatient ?Remains inpatient appropriate because: Perforated diverticulitis with abscess-n.p.o.-on IVF/IV antibiotics-not yet stable for discharge. ?  ?Planned  Discharge Destination:Home ? ? ?Diet: ?Diet Order   ? ?       ?  Diet clear liquid Room service appropriate? Yes; Fluid consistency: Thin  Diet effective now       ?  ? ?  ?  ? ?  ?  ? ? ?Antimicrobial agents: ?Anti-infectives (From admission, onward)  ? ? Start     Dose/Rate Route Frequency Ordered Stop  ? 10/20/21 2300  piperacillin-tazobactam (ZOSYN) IVPB 3.375 g       ? 3.375 g ?12.5 mL/hr over 240 Minutes Intravenous Every 8 hours 10/20/21 1854    ? 10/20/21 1700   piperacillin-tazobactam (ZOSYN) IVPB 3.375 g       ? 3.375 g ?100 mL/hr over 30 Minutes Intravenous  Once 10/20/21 1656 10/20/21 1745  ? ?  ? ? ? ?MEDICATIONS: ?Scheduled Meds: ? (feeding supplement) PROSource Plus  30 mL Oral BID BM  ? amLODipine  10 mg Oral Daily  ? aspirin EC  81 mg Oral Daily  ? enoxaparin (LOVENOX) injection  40 mg Subcutaneous Q24H  ? feeding supplement  1 Container Oral TID BM  ? fenofibrate  160 mg Oral Daily  ? fluticasone  2 spray Each Nare QHS  ? insulin aspart  0-9 Units Subcutaneous TID WC  ? lisinopril  40 mg Oral Daily  ? metoprolol tartrate  50 mg Oral BID  ? pantoprazole  40 mg Oral BID AC  ? polyethylene glycol  17 g Oral Daily  ? rosuvastatin  10 mg Oral Daily  ? ?Continuous Infusions: ? sodium chloride 75 mL/hr at 10/24/21 4098  ? piperacillin-tazobactam (ZOSYN)  IV 3.375 g (10/25/21 0843)  ? ?PRN Meds:.acetaminophen **OR** acetaminophen, albuterol, alum & mag hydroxide-simeth, artificial tears, clonazePAM, morphine injection, ondansetron **OR** ondansetron (ZOFRAN) IV, oxyCODONE, temazepam ? ? ?I have personally reviewed following labs and imaging studies ? ?LABORATORY DATA: ?CBC: ?Recent Labs  ?Lab 10/21/21 ?0320 10/22/21 ?1191 10/23/21 ?4782 10/24/21 ?9562 10/25/21 ?0146  ?WBC 15.5* 11.1* 12.8* 14.2* 18.1*  ?NEUTROABS  --  8.8*  --   --   --   ?HGB 13.3 12.8* 13.3 13.3 13.2  ?HCT 40.4 38.5* 40.0 40.0 40.2  ?MCV 91.4 90.4 91.7 91.1 89.5  ?PLT 614* 507* 610* 588* 613*  ? ? ? ?Basic Metabolic Panel: ?Recent Labs  ?Lab 10/21/21 ?0320 10/22/21 ?1308 10/23/21 ?6578 10/24/21 ?4696 10/25/21 ?0146  ?NA 129* 134* 131* 136 131*  ?K 3.7 3.5 3.4* 4.5 3.4*  ?CL 100 105 102 107 102  ?CO2 23 22 21* 24 19*  ?GLUCOSE 131* 100* 114* 119* 176*  ?BUN 8 7* 5* 5* 5*  ?CREATININE 0.79 0.75 0.66 0.70 0.78  ?CALCIUM 8.7* 8.4* 8.6* 9.0 8.4*  ?MG  --   --   --  1.5* 2.4  ? ? ? ?GFR: ?Estimated Creatinine Clearance: 81.1 mL/min (by C-G formula based on SCr of 0.78 mg/dL). ? ?Liver Function Tests: ?Recent  Labs  ?Lab 10/20/21 ?0852 10/21/21 ?0320  ?AST 21 18  ?ALT 12 8  ?ALKPHOS 56 51  ?BILITOT 0.8 0.6  ?PROT 7.5 6.2*  ?ALBUMIN 3.1* 2.6*  ? ? ?Recent Labs  ?Lab 10/20/21 ?0852  ?LIPASE 28  ? ? ?No results for input(s): AMMONIA in the last 168 hours. ? ?Coagulation Profile: ?No results for input(s): INR, PROTIME in the last 168 hours. ? ?Cardiac Enzymes: ?No results for input(s): CKTOTAL, CKMB, CKMBINDEX, TROPONINI in the last 168 hours. ? ?BNP (last 3 results) ?No results for input(s): PROBNP in  the last 8760 hours. ? ?Lipid Profile: ?No results for input(s): CHOL, HDL, LDLCALC, TRIG, CHOLHDL, LDLDIRECT in the last 72 hours. ? ?Thyroid Function Tests: ?No results for input(s): TSH, T4TOTAL, FREET4, T3FREE, THYROIDAB in the last 72 hours. ? ?Anemia Panel: ?No results for input(s): VITAMINB12, FOLATE, FERRITIN, TIBC, IRON, RETICCTPCT in the last 72 hours. ? ?Urine analysis: ?   ?Component Value Date/Time  ? COLORURINE YELLOW 10/20/2021 0855  ? APPEARANCEUR HAZY (A) 10/20/2021 0855  ? LABSPEC 1.012 10/20/2021 0855  ? PHURINE 6.0 10/20/2021 0855  ? GLUCOSEU NEGATIVE 10/20/2021 0855  ? HGBUR NEGATIVE 10/20/2021 0855  ? BILIRUBINUR NEGATIVE 10/20/2021 0855  ? KETONESUR NEGATIVE 10/20/2021 0855  ? PROTEINUR NEGATIVE 10/20/2021 0855  ? UROBILINOGEN 1.0 04/11/2007 1627  ? NITRITE NEGATIVE 10/20/2021 0855  ? LEUKOCYTESUR NEGATIVE 10/20/2021 0855  ? ? ?Sepsis Labs: ?Lactic Acid, Venous ?No results found for: LATICACIDVEN ? ?MICROBIOLOGY: ?No results found for this or any previous visit (from the past 240 hour(s)). ? ?RADIOLOGY STUDIES/RESULTS: ?No results found. ? ? LOS: 5 days  ? ?Jeoffrey MassedShanker Elhadji Pecore, MD  ?[facility] ? ? ? ?To contact the attending provider between 7A-7P or the covering provider during after hours 7P-7A, please log into the web site [email or web address] and access using universal [place] password for that web site. If you do not have the password, please call the hospital operator. ? ?10/25/2021, 10:43 AM ? ?  ?

## 2021-10-25 NOTE — Anesthesia Procedure Notes (Signed)
Procedure Name: Intubation ?Date/Time: 10/25/2021 7:17 PM ?Performed by: Reece Agar, CRNA ?Pre-anesthesia Checklist: Patient identified, Emergency Drugs available, Suction available and Patient being monitored ?Patient Re-evaluated:Patient Re-evaluated prior to induction ?Oxygen Delivery Method: Circle System Utilized ?Preoxygenation: Pre-oxygenation with 100% oxygen ?Induction Type: IV induction, Rapid sequence and Cricoid Pressure applied ?Laryngoscope Size: Mac and 3 ?Grade View: Grade I ?Tube type: Oral ?Tube size: 7.5 mm ?Number of attempts: 1 ?Airway Equipment and Method: Stylet ?Placement Confirmation: ETT inserted through vocal cords under direct vision, positive ETCO2 and breath sounds checked- equal and bilateral ?Secured at: 22 cm ?Tube secured with: Tape ?Dental Injury: Teeth and Oropharynx as per pre-operative assessment  ? ? ? ? ?

## 2021-10-25 NOTE — Op Note (Addendum)
Operative Note ? ?Robert Barrett  ?[id number]  ?[id number]  ?10/25/2021 ? ? ?Surgeon: Romana Juniper MD FACS ?  ?Assistant: Kaylyn Lim MD FACS ?  ?Procedure performed: Exploratory laparotomy, right hemicolectomy, sigmoid colectomy with end colostomy ?  ?Preop diagnosis: Perforated viscus ?Post-op diagnosis/intraop findings:  ?Large bowel obstruction with perforated cecum and diffuse feculent peritonitis ?Sigmoid diverticulitis with dense inflammatory adhesions to the bladder as well as associated localized abscess ?  ?Specimens: Right hemicolectomy, sigmoid colon ?Retained items: 15 French round Blake drain in the pelvis ?EBL: 200 cc ?Complications: none ?  ?Description of procedure: After obtaining informed consent the patient was taken to the operating room and placed supine on operating room table where general endotracheal anesthesia was initiated, preoperative antibiotics were administered, SCDs applied, and a formal timeout was performed.  A Foley catheter was inserted under sterile conditions.  The abdomen was clipped, prepped and draped in usual sterile fashion.  A midline laparotomy was created and upon entry immediately was encountered murky ascites, diffuse feculent peritonitis with a large volume of stool in the abdominal cavity and a massively dilated and ischemic appearing cecum.  The cecum was gently mobilized and the perforation identified.  The cecum was then decompressed through this pre-existing perforation and this was then temporarily closed with a silk pursestring suture.  ?Inflammatory adhesions between loops of small bowel were carefully bluntly broken up until the small bowel could be reflected out of the pelvis and the sigmoid colon identified.  The distal sigmoid is densely adherent to the pelvic sidewall and the bladder and a combination of sparing cautery and finger fracture technique was used to deliver the sigmoid up into the field and to carefully dissect down to an area of palpably  healthy rectum.  A window was made in the mesorectum and a contour green load stapler was used to divide the rectum and an area that was soft and healthy.  This was marked with a 2-0 Prolene on the left side of the staple line.  We then identified an area proximal to the area of firm indurated sigmoid which was soft enough to staple across and this was divided with another contour green load stapler.  The mesentery to the sigmoid was divided with the LigaSure, staying close to the bowel.  The retroperitoneum was not entered.  Some bleeding along the mesentery was oversewn with figure-of-eight 2-0 silk sutures.  The specimen was then handed off for pathology. The peritoneum overlying the bladder where the diverticular abscess had been was indurated, but there was no obvious defect or evidence of fistula to the bladder.  ?We then turned to the cecum which remained ischemic appearing and was recurrently dilated after being decompressed.  The lateral attachments and hepatic flexure were mobilized with a combination of blunt dissection and cautery.  The transverse colon was divided and the terminal ileum were divided and then the intervening mesentery to the cecum and ascending colon was taken down with the LigaSure.  The specimen was handed off as well.  Hemostasis was ensured within the wound.  The bowel appeared edematous but viable.  Ileocolonic anastomosis was created with a blue load 75 mm Endo GIA stapler.  The common enterotomy was closed with 2 layers of 3-0 Vicryl.  This was imbricated and an omental pedicle flap sutured over this for additional reinforcement.  3-0 Vicryl was placed at the apex of the staple line.  On completion the anastomosis did appear viable, widely patent and free of tension.   ?  The abdomen was then irrigated with several liters of warm sterile saline.  A 19 French round Blake drain was inserted through the right lower quadrant incision and directed down into the pelvis.  This is secured  to skin with a 2-0 nylon.  An ostomy site was created in the left upper quadrant and the stapled end of the descending colon brought out through this and secured with [place].  The omentum was brought back down over the small bowel.  The fascia in the midline was closed with running looped #1 PDS starting at each end and tying centrally.  The ostomy was matured with interrupted 3-0 Vicryl's.  Of note some of the staple line from the division of the descending colon is incorporated into the ostomy closure inferiorly.  The drain was placed to bulb suction, ostomy appliance was placed, and a damp to dry dressing were applied to the midline.  The patient was then awakened, extubated and taken to PACU in stable condition.  ?  ?All counts were correct at the completion of the case.   ?

## 2021-10-25 NOTE — Anesthesia Preprocedure Evaluation (Addendum)
Anesthesia Evaluation  ?Patient identified by MRN, date of birth, ID band ? ?Reviewed: ?Allergy & Precautions, NPO status , Patient's Chart, lab work & pertinent test results, reviewed documented beta blocker date and time  ? ?History of Anesthesia Complications ?Negative for: history of anesthetic complications ? ?Airway ?Mallampati: III ? ?TM Distance: >3 FB ?Neck ROM: Full ? ? ? Dental ? ?(+) Dental Advisory Given ?  ?Pulmonary ?sleep apnea ,  ?  ? ?+ decreased breath sounds ? ? ? ? ? Cardiovascular ?hypertension, Pt. on medications and Pt. on home beta blockers ?+ CAD and + CABG  ? ?Rhythm:Regular  ? ?  ?Neuro/Psych ?PSYCHIATRIC DISORDERS Anxiety negative neurological ROS ?   ? GI/Hepatic ?Neg liver ROS, GERD  Medicated,  ?Endo/Other  ?diabetes ? Renal/GU ?negative Renal ROS  ?negative genitourinary ?  ?Musculoskeletal ?negative musculoskeletal ROS ?(+)  ? Abdominal ?  ?Peds ? Hematology ?negative hematology ROS ?(+)   ?Anesthesia Other Findings ?Presented to ED with abdominal pain found to have a perforated diverticulitis with a 6cm abscess.  ? Reproductive/Obstetrics ? ?  ? ? ? ? ? ? ? ? ? ? ? ? ? ?  ?  ? ? ? ? ? ? ?Anesthesia Physical ?Anesthesia Plan ? ?ASA: 3 ? ?Anesthesia Plan: General  ? ?Post-op Pain Management: Dilaudid IV  ? ?Induction: Intravenous and Rapid sequence ? ?PONV Risk Score and Plan: 2 and Midazolam, Dexamethasone and Ondansetron ? ?Airway Management Planned: Oral ETT ? ?Additional Equipment:  ? ?Intra-op Plan:  ? ?Post-operative Plan: Extubation in OR ? ?Informed Consent: I have reviewed the patients History and Physical, chart, labs and discussed the procedure including the risks, benefits and alternatives for the proposed anesthesia with the patient or authorized representative who has indicated his/her understanding and acceptance.  ? ? ? ?Dental advisory given ? ?Plan Discussed with: CRNA ? ?Anesthesia Plan Comments:   ? ? ? ? ? ?Anesthesia Quick  Evaluation ? ?

## 2021-10-25 NOTE — Progress Notes (Signed)
Pt refused CPAP tonight.

## 2021-10-25 NOTE — Progress Notes (Signed)
RN was assessing NG tube placement in pacu. Noticed tube was in patient's mouth. NG tube removed and patient was educated on having it reinserted. Patient refused to have NG tube reinserted. Dr Fredricka Bonine paged and made aware. Patient is alert an oriented. Educated on why he currently needs NG tube. States " I don't want it.". Xray for placement verification cancelled.  ?

## 2021-10-25 NOTE — Transfer of Care (Signed)
Immediate Anesthesia Transfer of Care Note ? ?Patient: Robert Barrett ? ?Procedure(s) Performed: EXPLORATORY LAPAROTOMY, Hartman Procedure (Abdomen) ? ?Patient Location: PACU ? ?Anesthesia Type:General ? ?Level of Consciousness: awake and alert  ? ?Airway & Oxygen Therapy: Patient Spontanous Breathing and Patient connected to face mask oxygen ? ?Post-op Assessment: Report given to RN and Post -op Vital signs reviewed and stable ? ?Post vital signs: Reviewed and stable ? ?Last Vitals:  ?Vitals Value Taken Time  ?BP 117/74 10/25/21 2215  ?Temp 36.6 ?C 10/25/21 2215  ?Pulse 107 10/25/21 2219  ?Resp 22 10/25/21 2219  ?SpO2 98 % 10/25/21 2219  ?Vitals shown include unvalidated device data. ? ?Last Pain:  ?Vitals:  ? 10/25/21 1832  ?TempSrc:   ?PainSc: 0-No pain  ?   ? ?Patients Stated Pain Goal: 3 (10/24/21 1610) ? ?Complications: No notable events documented. ?

## 2021-10-25 NOTE — Progress Notes (Signed)
[facility] ?Progress Note ? ?   ?Subjective: ?CC-  ?Drinking contrast for CT. States that he is more bloated than yesterday. Passing some flatus and had a small BM this morning. Burping some but denies nausea or vomiting. ?WBC up 18.1, afebrile. ? ?Objective: ?Vital signs in last 24 hours: ?Temp:  [98 ?F (36.7 ?C)-99.5 ?F (37.5 ?C)] 98 ?F (36.7 ?C) (04/18 7371) ?Pulse Rate:  [71-100] 75 (04/18 0733) ?Resp:  [17-20] 17 (04/18 0733) ?BP: (93-164)/(57-79) 113/66 (04/18 0626) ?SpO2:  [90 %-95 %] 95 % (04/18 0733) ?Last BM Date : 10/15/21 ? ?Intake/Output from previous day: ?No intake/output data recorded. ?Intake/Output this shift: ?No intake/output data recorded. ? ?PE: ?Gen:  Alert, NAD, pleasant ?Abd: distended and tympanic, somewhat firm, mild LLQ and RLQ TTP without rebound or guarding ? ?Lab Results:  ?Recent Labs  ?  10/24/21 ?0324 10/25/21 ?0146  ?WBC 14.2* 18.1*  ?HGB 13.3 13.2  ?HCT 40.0 40.2  ?PLT 588* 613*  ? ?BMET ?Recent Labs  ?  10/24/21 ?0324 10/25/21 ?0146  ?NA 136 131*  ?K 4.5 3.4*  ?CL 107 102  ?CO2 24 19*  ?GLUCOSE 119* 176*  ?BUN 5* 5*  ?CREATININE 0.70 0.78  ?CALCIUM 9.0 8.4*  ? ?PT/INR ?No results for input(s): LABPROT, INR in the last 72 hours. ?CMP  ?   ?Component Value Date/Time  ? NA 131 (L) 10/25/2021 0146  ? NA 137 01/19/2021 0000  ? K 3.4 (L) 10/25/2021 0146  ? CL 102 10/25/2021 0146  ? CO2 19 (L) 10/25/2021 0146  ? GLUCOSE 176 (H) 10/25/2021 0146  ? BUN 5 (L) 10/25/2021 0146  ? BUN 6 01/19/2021 0000  ? CREATININE 0.78 10/25/2021 0146  ? CREATININE 1.17 04/13/2016 1422  ? CALCIUM 8.4 (L) 10/25/2021 0146  ? PROT 6.2 (L) 10/21/2021 0320  ? ALBUMIN 2.6 (L) 10/21/2021 0320  ? AST 18 10/21/2021 0320  ? ALT 8 10/21/2021 0320  ? ALKPHOS 51 10/21/2021 0320  ? BILITOT 0.6 10/21/2021 0320  ? GFRNONAA >60 10/25/2021 0146  ? GFRNONAA 62 04/13/2016 1422  ? GFRAA 72 04/13/2016 1422  ? ?Lipase  ?   ?Component Value Date/Time  ? LIPASE 28 10/20/2021 0852  ? ? ? ? ? ?Studies/Results: ?No  results found. ? ?Anti-infectives: ?Anti-infectives (From admission, onward)  ? ? Start     Dose/Rate Route Frequency Ordered Stop  ? 10/20/21 2300  piperacillin-tazobactam (ZOSYN) IVPB 3.375 g       ? 3.375 g ?12.5 mL/hr over 240 Minutes Intravenous Every 8 hours 10/20/21 1854    ? 10/20/21 1700  piperacillin-tazobactam (ZOSYN) IVPB 3.375 g       ? 3.375 g ?100 mL/hr over 30 Minutes Intravenous  Once 10/20/21 1656 10/20/21 1745  ? ?  ? ? ? ?Assessment/Plan ?Diverticulitis with perforation and 6cm abscess  ?- IR unable to drain fluid collection noted on CT scan 4/13 ?- WBC rising 18.1, afebrile ?- Repeat CT ordered yesterday, will be done today around 1130. Updated recommendations to come after scan. For now continue IV zosyn and clear liquids. ?  ?FEN: CLD ?ID: zosyn 4/13> ?VTE: lovenox ?  ?I reviewed hospitalist notes, last 24 h vitals and pain scores, last 48 h intake and output, last 24 h labs and trends (CBC, BMP, magnesium), and CT scan from 4/13. ?  ?T2DM ?HTN ?HLD ?CAD s/p CABG ?GERD ?Anxiety ?OSA ? ? LOS: 5 days  ? ? ?Franne Forts, PA-C ?[facility] ?10/25/2021, 9:37 AM ?Please see  Amion for pager number during day hours 7:00am-4:30pm ? ?

## 2021-10-25 NOTE — Progress Notes (Addendum)
Patient evaluated and CT results reviewed. He thinks he is somewhat better than yesterday, having had a bowel movement this morning, but his wife reports he had a pretty rough night. He reports ongoing distention, but is not in tremendous pain currently. ?His last vital signs were at 11:30 this AM and normal; WBC uptrending ?Abdomen distended, tender with voluntary guarding in R>L lower fields, not overtly peritonitic. ?Although his exam now is reassuring, I am concerned given evidence of free perforation with distention/pneumatosis of the cecum and think he needs to go to OR now for exploration, partial colectomy, possible ileocecectomy, and near certain ostomy creation. I do not want to risk him becoming septic overnight especially given his age and comorbidities if we do not go ahead and try to get source control now. I discussed risks of surgery with him and his wife including bleeding, infection, pain, scarring, intraabdominal injury, need for further procedures, ileus/obstruction, delayed abscess, wound healing problems/hernia, cardiac/ pulmonary/ thromboembolic complications. Questions welcomed and answered. He wishes to proceed.  ? ? ?

## 2021-10-26 ENCOUNTER — Encounter (HOSPITAL_COMMUNITY): Payer: Self-pay | Admitting: Surgery

## 2021-10-26 ENCOUNTER — Inpatient Hospital Stay: Payer: Self-pay

## 2021-10-26 DIAGNOSIS — E440 Moderate protein-calorie malnutrition: Secondary | ICD-10-CM | POA: Insufficient documentation

## 2021-10-26 DIAGNOSIS — I2510 Atherosclerotic heart disease of native coronary artery without angina pectoris: Secondary | ICD-10-CM | POA: Diagnosis not present

## 2021-10-26 DIAGNOSIS — I10 Essential (primary) hypertension: Secondary | ICD-10-CM | POA: Diagnosis not present

## 2021-10-26 DIAGNOSIS — K5720 Diverticulitis of large intestine with perforation and abscess without bleeding: Secondary | ICD-10-CM | POA: Diagnosis not present

## 2021-10-26 DIAGNOSIS — F419 Anxiety disorder, unspecified: Secondary | ICD-10-CM | POA: Diagnosis not present

## 2021-10-26 LAB — CBC
HCT: 44 % (ref 39.0–52.0)
HCT: 36 % — ABNORMAL LOW (ref 39.0–52.0)
HCT: 37.5 % — ABNORMAL LOW (ref 39.0–52.0)
Hemoglobin: 14.8 g/dL (ref 13.0–17.0)
Hemoglobin: 11.8 g/dL — ABNORMAL LOW (ref 13.0–17.0)
Hemoglobin: 12.7 g/dL — ABNORMAL LOW (ref 13.0–17.0)
MCH: 30.5 pg (ref 26.0–34.0)
MCH: 29.9 pg (ref 26.0–34.0)
MCH: 30.6 pg (ref 26.0–34.0)
MCHC: 33.6 g/dL (ref 30.0–36.0)
MCHC: 32.8 g/dL (ref 30.0–36.0)
MCHC: 33.9 g/dL (ref 30.0–36.0)
MCV: 90.5 fL (ref 80.0–100.0)
MCV: 91.4 fL (ref 80.0–100.0)
MCV: 90.4 fL (ref 80.0–100.0)
Platelets: 848 10*3/uL — ABNORMAL HIGH (ref 150–400)
Platelets: 615 10*3/uL — ABNORMAL HIGH (ref 150–400)
Platelets: 663 10*3/uL — ABNORMAL HIGH (ref 150–400)
RBC: 4.86 MIL/uL (ref 4.22–5.81)
RBC: 3.94 MIL/uL — ABNORMAL LOW (ref 4.22–5.81)
RBC: 4.15 MIL/uL — ABNORMAL LOW (ref 4.22–5.81)
RDW: 13.9 % (ref 11.5–15.5)
RDW: 13.9 % (ref 11.5–15.5)
RDW: 13.9 % (ref 11.5–15.5)
WBC: 14.6 10*3/uL — ABNORMAL HIGH (ref 4.0–10.5)
WBC: 15.2 10*3/uL — ABNORMAL HIGH (ref 4.0–10.5)
WBC: 17.3 10*3/uL — ABNORMAL HIGH (ref 4.0–10.5)
nRBC: 0 % (ref 0.0–0.2)
nRBC: 0 % (ref 0.0–0.2)
nRBC: 0 % (ref 0.0–0.2)

## 2021-10-26 LAB — BASIC METABOLIC PANEL
Anion gap: 10 (ref 5–15)
BUN: 12 mg/dL (ref 8–23)
CO2: 18 mmol/L — ABNORMAL LOW (ref 22–32)
Calcium: 8.2 mg/dL — ABNORMAL LOW (ref 8.9–10.3)
Chloride: 103 mmol/L (ref 98–111)
Creatinine, Ser: 0.97 mg/dL (ref 0.61–1.24)
GFR, Estimated: >60 mL/min (ref >60)
Glucose, Bld: 160 mg/dL — ABNORMAL HIGH (ref 70–99)
Potassium: 3.8 mmol/L (ref 3.5–5.1)
Sodium: 131 mmol/L — ABNORMAL LOW (ref 135–145)

## 2021-10-26 LAB — GLUCOSE, CAPILLARY
Glucose-Capillary: 148 mg/dL — ABNORMAL HIGH (ref 70–99)
Glucose-Capillary: 154 mg/dL — ABNORMAL HIGH (ref 70–99)
Glucose-Capillary: 139 mg/dL — ABNORMAL HIGH (ref 70–99)
Glucose-Capillary: 138 mg/dL — ABNORMAL HIGH (ref 70–99)
Glucose-Capillary: 173 mg/dL — ABNORMAL HIGH (ref 70–99)

## 2021-10-26 MED ORDER — PANTOPRAZOLE SODIUM 40 MG IV SOLR
40.0000 mg | Freq: Two times a day (BID) | INTRAVENOUS | Status: DC
  Administered 2021-10-26 – 2021-10-29 (×7): 40 mg via INTRAVENOUS
  Filled 2021-10-26 (×8): qty 10

## 2021-10-26 MED ORDER — SODIUM CHLORIDE 0.9 % IV BOLUS
500.0000 mL | Freq: Once | INTRAVENOUS | Status: AC
  Administered 2021-10-26: 500 mL via INTRAVENOUS

## 2021-10-26 MED ORDER — CHLORHEXIDINE GLUCONATE CLOTH 2 % EX PADS
6.0000 | MEDICATED_PAD | Freq: Every day | CUTANEOUS | Status: DC
  Administered 2021-10-26 – 2021-11-04 (×10): 6 via TOPICAL

## 2021-10-26 MED ORDER — ACETAMINOPHEN 10 MG/ML IV SOLN
1000.0000 mg | Freq: Four times a day (QID) | INTRAVENOUS | Status: AC
  Administered 2021-10-26 (×4): 1000 mg via INTRAVENOUS
  Filled 2021-10-26 (×5): qty 100

## 2021-10-26 MED ORDER — HYDROMORPHONE HCL 1 MG/ML IJ SOLN
0.5000 mg | INTRAMUSCULAR | Status: DC | PRN
  Administered 2021-10-26 (×3): 1 mg via INTRAVENOUS
  Administered 2021-10-26: 0.5 mg via INTRAVENOUS
  Administered 2021-10-27 – 2021-10-28 (×7): 1 mg via INTRAVENOUS
  Administered 2021-10-29: 0.5 mg via INTRAVENOUS
  Administered 2021-10-29 – 2021-11-01 (×4): 1 mg via INTRAVENOUS
  Filled 2021-10-26: qty 1
  Filled 2021-10-26: qty 0.5
  Filled 2021-10-26 (×14): qty 1

## 2021-10-26 MED ORDER — SODIUM CHLORIDE 0.9% FLUSH
10.0000 mL | Freq: Two times a day (BID) | INTRAVENOUS | Status: DC
  Administered 2021-10-26 – 2021-10-29 (×3): 10 mL

## 2021-10-26 MED ORDER — SODIUM CHLORIDE 0.9% FLUSH
10.0000 mL | INTRAVENOUS | Status: DC | PRN
  Administered 2021-10-26: 10 mL
  Administered 2021-11-01 – 2021-11-02 (×2): 20 mL

## 2021-10-26 MED ORDER — INSULIN ASPART 100 UNIT/ML IJ SOLN
0.0000 [IU] | Freq: Four times a day (QID) | INTRAMUSCULAR | Status: DC
  Administered 2021-10-26 (×2): 1 [IU] via SUBCUTANEOUS
  Administered 2021-10-27: 2 [IU] via SUBCUTANEOUS
  Administered 2021-10-27: 1 [IU] via SUBCUTANEOUS
  Administered 2021-10-27 (×2): 2 [IU] via SUBCUTANEOUS
  Administered 2021-10-28: 1 [IU] via SUBCUTANEOUS
  Administered 2021-10-28: 2 [IU] via SUBCUTANEOUS
  Administered 2021-10-28 (×2): 1 [IU] via SUBCUTANEOUS
  Administered 2021-10-28 – 2021-10-29 (×2): 2 [IU] via SUBCUTANEOUS
  Administered 2021-10-29: 1 [IU] via SUBCUTANEOUS
  Administered 2021-10-29: 2 [IU] via SUBCUTANEOUS
  Administered 2021-10-29: 3 [IU] via SUBCUTANEOUS
  Administered 2021-10-30: 2 [IU] via SUBCUTANEOUS

## 2021-10-26 MED ORDER — METHOCARBAMOL 1000 MG/10ML IJ SOLN
500.0000 mg | Freq: Four times a day (QID) | INTRAMUSCULAR | Status: DC | PRN
  Administered 2021-10-30: 500 mg via INTRAVENOUS
  Filled 2021-10-26 (×2): qty 5

## 2021-10-26 MED ORDER — SODIUM CHLORIDE 0.9 % IV SOLN: INTRAVENOUS | Status: DC

## 2021-10-26 MED ORDER — TRAVASOL 10 % IV SOLN
INTRAVENOUS | Status: AC
  Filled 2021-10-26: qty 489.6

## 2021-10-26 NOTE — Progress Notes (Signed)
Urine output is 15cc within the last three hours. Dr. Kae Heller notified. New orders received. ?

## 2021-10-26 NOTE — Progress Notes (Addendum)
PHARMACY - TOTAL PARENTERAL NUTRITION CONSULT NOTE  ? ?Indication:  NPO > 7 days ? ?Patient Measurements: ?Height: 5\' 10"  (177.8 cm) ?Weight: 82.7 kg (182 lb 5.1 oz) ?IBW/kg (Calculated) : 73 ?  ?Body mass index is 26.16 kg/m?. ?Usual Weight: 95 kg ? ?Assessment: [name] with past medical history of T2DM, HTN, HLD, CAD s/p CABG who presented on 4/13 to the emergency department with colicky abdominal pain and nausea. CT from 4/13 showed perforated diverticulitis with a 6 mm abscess at which time he was made NPO. IR was unable to drain abscess, however, pt began improving on Zosyn. Pt was given a CLD on 4/15-4/18. Repeat CT on 4/18 showed new moderate free intraperitoneal air c/w sigmoid colon perforation, moderate-severe sigmoid diverticulitis, severe ileus w/ dilated cecum demonstrating penumatosis. These findings lead to emergent surgery on 4/18 - ex-lap  R hemicolectomy, sigmoid colectomy with end colostomy creation. ? ?Prior to admission, patient reports a very poor appetite over the past 6 months. He has noted a gradual weight loss over that time to 197 lb prior to admission. He reports only being able to tolerate a few juices while on the CLD since admission. Pharmacy consulted to initiate and mange TPN on 4/19.  ? ?Glucose / Insulin: BG < 180, sSSI (4u given in last 24h) ?Electrolytes: Na 131, CO2 18, coCa 9.5, others wnl ?Renal: SCr 0.97, BUN 12 ?Hepatic: LFTs wnl, Albumin 2.6 ?Intake / Output; MIVF: RLQ pelvic drain output: 100 mL, ostomy output: none, UOP: 5/19 (documentation started post-op) ?MIVF: NS @ 100 ml/hr ?GI Imaging: None since surgery ?GI Surgeries / Procedures:  ?4/18 - ex-lap  R hemicolectomy, sigmoid colectomy with end colostomy creation ? ?Central access: None - needs to be placed ?TPN start date: 10/26/21 ? ?Nutritional Goals: ?Goal TPN rate is 90 mL/hr (provides 110 g of protein and 2160 kcals per day) ? ?RD Assessment: ?Estimated Needs ?Total Energy Estimated Needs: 2000-2200 kcal/d ?Total  Protein Estimated Needs: 100-110 g/d ?Total Fluid Estimated Needs: 2-2.2 L/d ? ?Current Nutrition:  ?NPO ? ?Plan:  ?Start TPN at 26mL/hr at 1800 (44% of goal rate) ?Electrolytes in TPN: Na 143mEq/L, K 29mEq/L, Ca 23mEq/L, Mg 14mEq/L, and Phos 46mmol/L. Cl:Ac 1:2 ?Add standard MVI and trace elements to TPN ?Add thiamine to TPN due to very high risk for refeeding syndrome.  ?Initiate Sensitive q6h SSI and adjust as needed  ?Reduce MIVF to 50 mL/hr at 1800 ?Monitor TPN labs daily until stable, then on Mon/Thurs. ? ?12m ?10/26/2021,7:35 AM ? ?

## 2021-10-26 NOTE — Progress Notes (Signed)
Peripherally Inserted Central Catheter Placement ? ?The IV Nurse has discussed with the patient and/or persons authorized to consent for the patient, the purpose of this procedure and the potential benefits and risks involved with this procedure.  The benefits include less needle sticks, lab draws from the catheter, and the patient may be discharged home with the catheter. Risks include, but not limited to, infection, bleeding, blood clot (thrombus formation), and puncture of an artery; nerve damage and irregular heartbeat and possibility to perform a PICC exchange if needed/ordered by physician.  Alternatives to this procedure were also discussed.  B[employer] Power PICC patient education guide, fact sheet on infection prevention and patient information card has been provided to patient /or left at bedside.  ?Consent obtained with wife at bedside  ? ?PICC Placement Documentation  ?PICC Double Lumen 10/26/21 Right Basilic 41 cm 0 cm (Active)  ?Indication for Insertion or Continuance of Line Administration of hyperosmolar/irritating solutions (i.e. TPN, Vancomycin, etc.) 10/26/21 0000  ?Exposed Catheter (cm) 0 cm 10/26/21 0000  ?Site Assessment Clean, Dry, Intact 10/26/21 0000  ?Lumen #1 Status Flushed;Saline locked;Blood return noted 10/26/21 0000  ?Lumen #2 Status Flushed;Saline locked;Blood return noted 10/26/21 0000  ?Dressing Type Securing device;Transparent 10/26/21 0000  ?Dressing Status Antimicrobial disc in place 10/26/21 0000  ?Safety Lock Not Applicable 10/26/21 0000  ?Line Care Connections checked and tightened 10/26/21 0000  ?Dressing Intervention New dressing 10/26/21 0000  ?Dressing Change Due 11/02/21 10/26/21 0000  ? ? ? ? ? ?Franne Grip Renee ?10/26/2021, 2:38 PM ? ?

## 2021-10-26 NOTE — Progress Notes (Signed)
[facility] ?Progress Note ? ?1 Day Post-Op  ?Subjective: ?CC-  ?Feeling ok this morning. States that his pain is well controlled. Denies any nausea or vomiting. Has had colostomy output.  ?BP still soft and UOP low. On IVF @ 50cc/hr, and received 1 additional 500cc bolus early this morning. ? ?Objective: ?Vital signs in last 24 hours: ?Temp:  [97.9 ?F (36.6 ?C)-99.8 ?F (37.7 ?C)] 99.8 ?F (37.7 ?C) (04/19 0820) ?Pulse Rate:  [80-114] 86 (04/19 0820) ?Resp:  [15-23] 17 (04/19 0820) ?BP: (85-118)/(56-77) 95/73 (04/19 0820) ?SpO2:  [92 %-99 %] 93 % (04/19 0830) ?Last BM Date : 10/15/21 ? ?Intake/Output from previous day: ?04/18 0701 - 04/19 0700 ?In: 2650 [I.V.:1900; IV Piggyback:750] ?Out: 1030 [Urine:380; Drains:250; Stool:200; Blood:200] ?Intake/Output this shift: ?No intake/output data recorded. ? ?PE: ?Gen:  Alert, NAD, pleasant ?Pulm: rate and effort normal, not wearing [place] and O2 sats 90s ?Cardio: RRR ?Abd: soft, mild distension, mild diffuse tenderness, open midline pink without cellulitis or purulent drainage, JP drain serosanguinous, ostomy viable with sweat and small amount of liquid brown stool in pouch ? ?Lab Results:  ?Recent Labs  ?  10/26/21 ?0122 10/26/21 ?0750  ?WBC 14.6* 15.2*  ?HGB 14.8 11.8*  ?HCT 44.0 36.0*  ?PLT 848* 615*  ? ?BMET ?Recent Labs  ?  10/25/21 ?0146 10/26/21 ?0122  ?NA 131* 131*  ?K 3.4* 3.8  ?CL 102 103  ?CO2 19* 18*  ?GLUCOSE 176* 160*  ?BUN 5* 12  ?CREATININE 0.78 0.97  ?CALCIUM 8.4* 8.2*  ? ?PT/INR ?No results for input(s): LABPROT, INR in the last 72 hours. ?CMP  ?   ?Component Value Date/Time  ? NA 131 (L) 10/26/2021 0122  ? NA 137 01/19/2021 0000  ? K 3.8 10/26/2021 0122  ? CL 103 10/26/2021 0122  ? CO2 18 (L) 10/26/2021 0122  ? GLUCOSE 160 (H) 10/26/2021 0122  ? BUN 12 10/26/2021 0122  ? BUN 6 01/19/2021 0000  ? CREATININE 0.97 10/26/2021 0122  ? CREATININE 1.17 04/13/2016 1422  ? CALCIUM 8.2 (L) 10/26/2021 0122  ? PROT 6.2 (L) 10/21/2021 0320  ? ALBUMIN 2.6 (L)  10/21/2021 0320  ? AST 18 10/21/2021 0320  ? ALT 8 10/21/2021 0320  ? ALKPHOS 51 10/21/2021 0320  ? BILITOT 0.6 10/21/2021 0320  ? GFRNONAA >60 10/26/2021 0122  ? GFRNONAA 62 04/13/2016 1422  ? GFRAA 72 04/13/2016 1422  ? ?Lipase  ?   ?Component Value Date/Time  ? LIPASE 28 10/20/2021 0852  ? ? ? ? ? ?Studies/Results: ?CT ABDOMEN PELVIS W CONTRAST ? ?Result Date: 10/25/2021 ?CLINICAL DATA:  Follow-up diverticulitis and abscess. EXAM: CT ABDOMEN AND PELVIS WITH CONTRAST TECHNIQUE: Multidetector CT imaging of the abdomen and pelvis was performed using the standard protocol following bolus administration of intravenous contrast. RADIATION DOSE REDUCTION: This exam was performed according to the departmental dose-optimization program which includes automated exposure control, adjustment of the mA and/or kV according to patient size and/or use of iterative reconstruction technique. CONTRAST:  OMNIPAQUE IOHEXOL 300 MG/ML  SOLN COMPARISON:  10/20/2021 FINDINGS: Lower Chest: New small bilateral pleural effusions and dependent atelectasis. Hepatobiliary: No hepatic masses identified. Gallbladder is unremarkable. No evidence of biliary ductal dilatation. Pancreas:  No mass or inflammatory changes. Spleen: Within normal limits in size and appearance. Adrenals/Urinary Tract: No masses identified. Stable small right renal cysts (no followup imaging is recommended.) no evidence of ureteral calculi or hydronephrosis. Unremarkable unopacified urinary bladder. Stomach/Bowel: Moderate to severe sigmoid diverticulosis is again seen. Previously seen  loculated fluid and gas collection in the central pelvis is no longer visualized, however there is a moderate amount of free intraperitoneal air consistent with bowel perforation. Diffuse small bowel and colonic dilatation has significantly increased, with cecum measuring 12 cm in diameter, consistent with severe ileus. Mild ascites is new since previous study. Small hiatal hernia  again noted. Vascular/Lymphatic: No pathologically enlarged lymph nodes. No acute vascular findings. Aortic atherosclerotic calcification noted. Reproductive:  Stable moderately enlarged prostate. Other:  None. Musculoskeletal:  No suspicious bone lesions identified. IMPRESSION: New moderate free intraperitoneal air, consistent with sigmoid colon perforation. Moderate to severe sigmoid diverticulitis. Severe ileus, with dilated cecum which measures 12 cm and demonstrates pneumatosis. This raises suspicion for ischemia. No evidence of portal venous gas. Mild ascites.  No abscess identified. New small bilateral pleural effusions and dependent atelectasis. Small hiatal hernia. Stable moderately enlarged prostate. Aortic Atherosclerosis (ICD10-I70.0). Critical Value/emergent results were called by telephone at the time of interpretation on 10/25/2021 at 4:49 pm to provider Dr. Jerral Ralph, who verbally acknowledged these results. Electronically Signed   By: Danae Orleans M.D.   On: 10/25/2021 16:55  ? ?[place] EKG SITE RITE ? ?Result Date: 10/26/2021 ?If [employer] not attached, placement could not be confirmed due to current cardiac rhythm.  ? ?Anti-infectives: ?Anti-infectives (From admission, onward)  ? ? Start     Dose/Rate Route Frequency Ordered Stop  ? 10/20/21 2300  piperacillin-tazobactam (ZOSYN) IVPB 3.375 g       ? 3.375 g ?12.5 mL/hr over 240 Minutes Intravenous Every 8 hours 10/20/21 1854    ? 10/20/21 1700  piperacillin-tazobactam (ZOSYN) IVPB 3.375 g       ? 3.375 g ?100 mL/hr over 30 Minutes Intravenous  Once 10/20/21 1656 10/20/21 1745  ? ?  ? ? ? ?Assessment/Plan  ?Perforated viscus ?-POD#1 s/p Exploratory laparotomy, right hemicolectomy, sigmoid colectomy with end colostomy 4/18 Dr. Fredricka Bonine ?- intraop findings: Large bowel obstruction with perforated cecum and diffuse feculent peritonitis, sigmoid diverticulitis with dense inflammatory adhesions to the bladder as well as associated localized abscess ?- path  pending ?- continue drain, currently serosanguinous ?- continue IV antibiotics ?- BID wet to dry dressing changes to midline abdominal wound ?- WOC consult for new colostomy ?- continue scheduled IV tylenol for improved pain control ?- ok for CLD but advised patient to take this slowly. Expect he will have an ileus and he has been without adequate oral intake for several days, will start PICC/TPN ? ?ID - zosyn 4/13>> ?FEN - IVF, TPN, CLD ?VTE - lovenox ?Foley - continue for strict I&Os ? ?ABL anemia - Hgb 11.8 from 14.8, some hypotension, recheck CBC this afternoon ?Oliguria - give another 500cc bolus, continue foley for strict I&O's ?T2DM ?HTN ?HLD ?CAD s/p CABG ?GERD ?Anxiety ?OSA ? ? LOS: 6 days  ? ? ?Franne Forts, PA-C ?[facility] ?10/26/2021, 10:11 AM ?Please see Amion for pager number during day hours 7:00am-4:30pm ? ?

## 2021-10-26 NOTE — Progress Notes (Signed)
?      ?                 PROGRESS NOTE ? ?      ?PATIENT DETAILS ?Name: Robert Barrett ?Age: 76 y.o. ?Sex: male ?Date of Birth: Nov 01, 1945 ?Admit Date: 10/20/2021 ?Admitting Physician Robert BeesShanker M Tanaysha Alkins, MD ?[name] is a 76 y.o.  male with past medical history of DM-2, HTN, HLD, CAD s/p CABG-presented with abdominal pain-found to have perforated diverticulitis with abscess.  Patient was admitted to the hospitalist service-started on Zosyn and other supportive care-General surgery followed closely, repeat CT scan was performed on 4/18 which showed worsening changes with moderate free intraperitoneal air, pneumatosis.  Patient was subsequently taken to the operating room by general surgery where he underwent right hemicolectomy, sigmoid colectomy and end ostomy placement.  See below for further details. ? ?Significant events: ?4/13>> [facility] perfor[facility] divert[facility] with 6 cm abscess. ?4/14>> per IR-no appropriate window to place drain. ?4/18>> repeat CT abdomen worsening free air/pneumatosis-surgery performed right hemicolectomy/sigmoid colectomy and end ostomy placement. ? ?Significant studies: ?4/13>> CT abdomen/pelvis: Perforated diverticulitis-6 cm abscess.  Upstream distention of large bowel-concerning for secondary large bowel obstruction. ?4/18>> CT abdomen/pelvis: New moderate free intraperitoneal air, severe ileus, dilated cecum with pneumatosis ? ?Significant microbiology data: ?None ? ?Procedures: ?None ? ?Consults: ?General surgery ? ?Subjective: ?A bit restless this morning.  Blood pressure was on the lower side last night. ? ?Objective: ?Vitals: ?Blood pressure 95/73, pulse 88, temperature 98.9 ?F (37.2 ?C), temperature source Oral, resp. rate 17, height 5\' 10"  (1.778 m), weight 82.7 kg, SpO2 90 %.  ? ?Exam: ?Gen Exam:Alert awake-not in any distress ?HEENT:atraumatic, normocephalic ?Chest: B/L clear to auscultation anteriorly ?CVS:S1S2  regular ?Abdomen: Soft-dressing present-drain in place.  Nondistended. ?Extremities:no edema ?Neurology: Non focal ?Skin: no rash  ? ?Pertinent Labs/Radiology: ? ?  Latest Ref Rng & Units 10/26/2021  ?  1:03 PM 10/26/2021  ?  7:50 AM 10/26/2021  ?  1:22 AM  ?CBC  ?WBC 4.0 - 10.5 K/uL 17.3   15.2   14.6    ?Hemoglobin 13.0 - 17.0 g/dL 78.212.7   95.611.8   21.314.8    ?Hematocrit 39.0 - 52.0 % 37.5   36.0   44.0    ?Platelets 150 - 400 K/uL 663   615   848    ?  ?Lab Results  ?Component Value Date  ? NA 131 (L) 10/26/2021  ? K 3.8 10/26/2021  ? CL 103 10/26/2021  ? CO2 18 (L) 10/26/2021  ? ?  ? ? ?Assessment/Plan: ?Perforated diverticulitis-s/p exploratory laparotomy-right hemicolectomy/sigmoid colectomy with end ostomy on 4/18: Failed conservative measures-underwent laparotomy/right hemicolectomy/sigmoid colectomy with end colostomy on 4/18.  Doing well post surgery-brief hypotensive episode last night but blood pressure has now improved with supportive care.  General surgery following-TNA to be started today.  ? ?Hyponatremia: Better-watch periodically. ? ?Hypokalemia: Repleted. ? ?DM-2 (A1c 6.0 on 4/14): Diet controlled at home-monitor with SSI.  ? ?Recent Labs  ?  10/26/21 ?0652 10/26/21 ?0852 10/26/21 ?1235  ?GLUCAP 148* 154* 139*  ? ?  ?  ?HTN: BP was briefly low last night but has responded to IV fluid resuscitation.  Stop all antihypertensives for now.  ? ?HLD: Hold fenofibrate/statin for now-resume when oral intake stabilizes. ? ?CAD s/p CABG approximately 10 years ago: No anginal symptoms-aspirin/statin/beta-blocker on hold-until oral intake resumes/stabilizes. ?  ?GERD: Continue PPI ?  ?Anxiety: Continue as needed Klonopin ?  ?  Insomnia: Continue as needed temazepam ? ?OSA: CPAP nightly ? ?BMI: ?Estimated body mass index is 26.16 kg/m? as calculated from the following: ?  Height as of this encounter: 5\' 10"  (1.778 m). ?  Weight as of this encounter: 82.7 kg.  ? ?Code status: ?  Code Status: Full Code  ? ?DVT  Prophylaxis: ?enoxaparin (LOVENOX) injection 40 mg Start: 10/21/21 1200 ?SCDs Start: 10/20/21 1800 ?Start prophylactic Lovenox 4/14 ?  ?Family Communication: Spouse-Robert Barrett-[phone]-left voicemail on 4/19 ? ? ?Disposition Plan: ?Status is: Inpatient ?Remains inpatient appropriate because: Perforated diverticulitis s/p laparotomy with colectomy and ostomy placement-not yet stable for discharge. ?Planned Discharge Destination:Home versus SNF ? ? ?Diet: ?Diet Order   ? ?       ?  Diet clear liquid Room service appropriate? Yes; Fluid consistency: Thin  Diet effective now       ?  ? ?  ?  ? ?  ?  ? ? ?Antimicrobial agents: ?Anti-infectives (From admission, onward)  ? ? Start     Dose/Rate Route Frequency Ordered Stop  ? 10/20/21 2300  piperacillin-tazobactam (ZOSYN) IVPB 3.375 g       ? 3.375 g ?12.5 mL/hr over 240 Minutes Intravenous Every 8 hours 10/20/21 1854    ? 10/20/21 1700  piperacillin-tazobactam (ZOSYN) IVPB 3.375 g       ? 3.375 g ?100 mL/hr over 30 Minutes Intravenous  Once 10/20/21 1656 10/20/21 1745  ? ?  ? ? ? ?MEDICATIONS: ?Scheduled Meds: ? amLODipine  10 mg Oral Daily  ? aspirin EC  81 mg Oral Daily  ? Chlorhexidine Gluconate Cloth  6 each Topical Daily  ? enoxaparin (LOVENOX) injection  40 mg Subcutaneous Q24H  ? feeding supplement  1 Container Oral TID BM  ? fenofibrate  160 mg Oral Daily  ? fluticasone  2 spray Each Nare QHS  ? insulin aspart  0-9 Units Subcutaneous Q6H  ? metoprolol tartrate  50 mg Oral BID  ? pantoprazole (PROTONIX) IV  40 mg Intravenous Q12H  ? rosuvastatin  10 mg Oral Daily  ? ?Continuous Infusions: ? sodium chloride 100 mL/hr at 10/26/21 0413  ? sodium chloride    ? acetaminophen 1,000 mg (10/26/21 0747)  ? methocarbamol (ROBAXIN) IV    ? piperacillin-tazobactam (ZOSYN)  IV 3.375 g (10/26/21 10/28/21)  ? TPN ADULT (ION)    ? ?PRN Meds:.albuterol, artificial tears, clonazePAM, HYDROmorphone (DILAUDID) injection, methocarbamol (ROBAXIN) IV, ondansetron **OR** ondansetron (ZOFRAN)  IV, oxyCODONE, temazepam ? ? ?I have personally reviewed following labs and imaging studies ? ?LABORATORY DATA: ?CBC: ?Recent Labs  ?Lab 10/22/21 ?10/24/21 10/23/21 ?10/25/21 10/24/21 ?10/26/21 10/25/21 ?10/27/21 10/26/21 ?0122 10/26/21 ?10/28/21 10/26/21 ?1303  ?WBC 11.1*   < > 14.2* 18.1* 14.6* 15.2* 17.3*  ?NEUTROABS 8.8*  --   --   --   --   --   --   ?HGB 12.8*   < > 13.3 13.2 14.8 11.8* 12.7*  ?HCT 38.5*   < > 40.0 40.2 44.0 36.0* 37.5*  ?MCV 90.4   < > 91.1 89.5 90.5 91.4 90.4  ?PLT 507*   < > 588* 613* 848* 615* 663*  ? < > = values in this interval not displayed.  ? ? ? ?Basic Metabolic Panel: ?Recent Labs  ?Lab 10/22/21 ?10/24/21 10/23/21 ?10/25/21 10/24/21 ?10/26/21 10/25/21 ?10/27/21 10/26/21 ?0122  ?NA 134* 131* 136 131* 131*  ?K 3.5 3.4* 4.5 3.4* 3.8  ?CL 105 102 107 102 103  ?CO2 22 21* 24 19* 18*  ?GLUCOSE 100*  114* 119* 176* 160*  ?BUN 7* 5* 5* 5* 12  ?CREATININE 0.75 0.66 0.70 0.78 0.97  ?CALCIUM 8.4* 8.6* 9.0 8.4* 8.2*  ?MG  --   --  1.5* 2.4  --   ? ? ? ?GFR: ?Estimated Creatinine Clearance: 66.9 mL/min (by C-G formula based on SCr of 0.97 mg/dL). ? ?Liver Function Tests: ?Recent Labs  ?Lab 10/20/21 ?0852 10/21/21 ?0320  ?AST 21 18  ?ALT 12 8  ?ALKPHOS 56 51  ?BILITOT 0.8 0.6  ?PROT 7.5 6.2*  ?ALBUMIN 3.1* 2.6*  ? ? ?Recent Labs  ?Lab 10/20/21 ?0852  ?LIPASE 28  ? ? ?No results for input(s): AMMONIA in the last 168 hours. ? ?Coagulation Profile: ?No results for input(s): INR, PROTIME in the last 168 hours. ? ?Cardiac Enzymes: ?No results for input(s): CKTOTAL, CKMB, CKMBINDEX, TROPONINI in the last 168 hours. ? ?BNP (last 3 results) ?No results for input(s): PROBNP in the last 8760 hours. ? ?Lipid Profile: ?No results for input(s): CHOL, HDL, LDLCALC, TRIG, CHOLHDL, LDLDIRECT in the last 72 hours. ? ?Thyroid Function Tests: ?No results for input(s): TSH, T4TOTAL, FREET4, T3FREE, THYROIDAB in the last 72 hours. ? ?Anemia Panel: ?No results for input(s): VITAMINB12, FOLATE, FERRITIN, TIBC, IRON, RETICCTPCT in the last 72  hours. ? ?Urine analysis: ?   ?Component Value Date/Time  ? COLORURINE YELLOW 10/20/2021 0855  ? APPEARANCEUR HAZY (A) 10/20/2021 0855  ? LABSPEC 1.012 10/20/2021 0855  ? PHURINE 6.0 10/20/2021 0855  ? GLUCOSEU NEGATIVE 10/20/2021 0855  ? HG

## 2021-10-26 NOTE — Progress Notes (Signed)
Pt arrives to unit from surgery via stretcher with PACU nurse. Respirations are even and non-labored. Oral membranes are pink and moist. Lung sounds are clear. Midline incision to abd with drg noted. JP drain to right abodmen. Ostomy to left abdomen draining brown stool. Abdomen distended, soft, and tender. Bowel sounds are present and hypoactive. Indwelling foley cath draining bloody urine. Pt moves all extremities without difficulty. Peripheral pulses are +2. Family at bedside. Bed in lowest position. Call light within reach. ?

## 2021-10-26 NOTE — Progress Notes (Signed)
Nutrition Follow-up ? ?DOCUMENTATION CODES:  ? ?Non-severe (moderate) malnutrition in context of chronic illness ? ?INTERVENTION:  ?- Initiation of TPN ?- Monitor magnesium, potassium, and phosphorus BID for at least 3 days, MD to replete as needed, as pt is at risk for refeeding syndrome. ?- Continue Boost Breeze po TID as tolerated, each supplement provides 250 kcal and 9 grams of protein ? ?NUTRITION DIAGNOSIS:  ? ?Moderate Malnutrition related to chronic illness as evidenced by mild fat depletion, mild muscle depletion, energy intake < 75% for > or equal to 1 month. ? ?GOAL:  ? ?Patient will meet greater than or equal to 90% of their needs ? ?Not being met- addressing needs via TPN initiation ? ?MONITOR:  ? ?PO intake, Supplement acceptance, Diet advancement, Labs, Weight trends, I & O's ? ?REASON FOR ASSESSMENT:  ? ?Consult ?New TPN/TNA ? ?ASSESSMENT:  ? ?76 y.o. male with hx od DM type 2, HTN, HLD, and CAD s/p CABG presented to ED with abdominal pain x 3 weeks. Imaging in ED showed a perforated diverticulitis with 6 x 3 cm abscess ? ?04/18: s/p exlap, R hemicolectomy, sigmoid colectomy with end colostomy ? ?Per Surgery, ok for CLD but advised to advance slowly as suspect pt may develop ileus d/t inadequate oral intake for several days ? ?Pt states that he had COVID ~6 months ago. Since then he has had a decrease in appetite and PO intake d/t no appetite, sensitivities to smell of foods and taste changes. States that he would occasionally drink 3 Red's Apple Ales as this was easy for him to tolerate and bites of food such as chicken, mashed potatoes or half of an egg biscuit. He endorses more significant decrease in PO intake 6 days PTA (~12 days ago) with nausea and increase in gagging without emesis. Throughout admission he states that he has not been able to tolerate much more than juices given abdominal pain.  ? ?About 1 year ago, he reports having had dental work and was drinking Ensure at that time but  has since stopped d/t cost.  ? ?Pt states that his usual weight is about 210 lbs but since COVID has been in gradual decline with most significant weight loss being 21 lbs within the last week and a half. There is limited recent weight history to confirm acute weight loss however noted a 10% weight loss from 03/30/21-10/23/21 which is not clinically significant for time frame however is concerning given pt's report of inadequate PO intake for the past 6 months.  ? ?Pt's malnutrition is likely chronic in nature given pt's report of inadequate PO intake for the past 6 months and ongoing weight loss. ? ?Medications: fenofibrate, SSI, protonix, IV abx ? ?Labs: sodium 131, CBG's [id number] hours ? ?NUTRITION - FOCUSED PHYSICAL EXAM: ? ?Flowsheet Row Most Recent Value  ?Orbital Region Mild depletion  ?Upper Arm Region Mild depletion  ?Thoracic and Lumbar Region No depletion  ?Buccal Region Mild depletion  ?Temple Region No depletion  ?Clavicle Bone Region No depletion  ?Clavicle and Acromion Bone Region No depletion  ?Scapular Bone Region No depletion  ?Dorsal Hand No depletion  ?Patellar Region Mild depletion  ?Anterior Thigh Region No depletion  ?Posterior Calf Region Mild depletion  ?Edema (RD Assessment) None  ?Hair Reviewed  ?Eyes Reviewed  ?Mouth Reviewed  ?Skin Reviewed  ?Nails Reviewed  ? ?  ? ? ?Diet Order:   ?Diet Order   ? ?       ?  Diet clear liquid  Room service appropriate? Yes; Fluid consistency: Thin  Diet effective now       ?  ? ?  ?  ? ?  ? ? ?EDUCATION NEEDS:  ? ?Education needs have been addressed ? ?Skin:  Skin Assessment: Skin Integrity Issues: ?Skin Integrity Issues:: Incisions ?Incisions: abdomen (closed) ? ?Last BM:  4/18 (248m via colostomy) ? ?Height:  ? ?Ht Readings from Last 1 Encounters:  ?10/23/21 _0  (1.778 m)  ? ? ?Weight:  ? ?Wt Readings from Last 1 Encounters:  ?10/23/21 82.7 kg  ? ? ?Ideal Body Weight:  75.5 kg ? ?BMI:  Body mass index is 26.16 kg/m?. ? ?Estimated Nutritional  Needs:  ? ?Kcal:  2000-2200 kcal/d ? ?Protein:  100-110 g/d ? ?Fluid:  2-2.2 L/d ? ?AClayborne Dana RDN, LDN ?Clinical Nutrition ?

## 2021-10-26 NOTE — Consult Note (Signed)
WOC Nurse ostomy consult note: POD 1 ? ?Consult received for new ostomy crated emergently in the evening on 4/18 by Dr. Fredricka Bonine for perforated viscus. ? ?WOC Nursing will see on Thursday, 4/20. ? ?WOC nursing team will follow, and will remain available to this patient, the nursing and medical teams.  ? ?Ladona Mow, MSN, RN, GNP, CWOCN, CWON-AP, FAAN  ?Pager# [phone]  ?

## 2021-10-27 ENCOUNTER — Inpatient Hospital Stay (HOSPITAL_COMMUNITY): Payer: No Typology Code available for payment source

## 2021-10-27 DIAGNOSIS — I10 Essential (primary) hypertension: Secondary | ICD-10-CM | POA: Diagnosis not present

## 2021-10-27 DIAGNOSIS — K219 Gastro-esophageal reflux disease without esophagitis: Secondary | ICD-10-CM | POA: Diagnosis not present

## 2021-10-27 DIAGNOSIS — I2510 Atherosclerotic heart disease of native coronary artery without angina pectoris: Secondary | ICD-10-CM | POA: Diagnosis not present

## 2021-10-27 DIAGNOSIS — F419 Anxiety disorder, unspecified: Secondary | ICD-10-CM | POA: Diagnosis not present

## 2021-10-27 DIAGNOSIS — K5720 Diverticulitis of large intestine with perforation and abscess without bleeding: Secondary | ICD-10-CM | POA: Diagnosis not present

## 2021-10-27 LAB — CBC
HCT: 33.7 % — ABNORMAL LOW (ref 39.0–52.0)
Hemoglobin: 11.2 g/dL — ABNORMAL LOW (ref 13.0–17.0)
MCH: 30.4 pg (ref 26.0–34.0)
MCHC: 33.2 g/dL (ref 30.0–36.0)
MCV: 91.3 fL (ref 80.0–100.0)
Platelets: 629 10*3/uL — ABNORMAL HIGH (ref 150–400)
RBC: 3.69 MIL/uL — ABNORMAL LOW (ref 4.22–5.81)
RDW: 13.9 % (ref 11.5–15.5)
WBC: 14.7 10*3/uL — ABNORMAL HIGH (ref 4.0–10.5)
nRBC: 0 % (ref 0.0–0.2)

## 2021-10-27 LAB — GLUCOSE, CAPILLARY
Glucose-Capillary: 131 mg/dL — ABNORMAL HIGH (ref 70–99)
Glucose-Capillary: 144 mg/dL — ABNORMAL HIGH (ref 70–99)
Glucose-Capillary: 154 mg/dL — ABNORMAL HIGH (ref 70–99)
Glucose-Capillary: 155 mg/dL — ABNORMAL HIGH (ref 70–99)
Glucose-Capillary: 160 mg/dL — ABNORMAL HIGH (ref 70–99)
Glucose-Capillary: 171 mg/dL — ABNORMAL HIGH (ref 70–99)

## 2021-10-27 LAB — COMPREHENSIVE METABOLIC PANEL
ALT: 16 U/L (ref 0–44)
AST: 27 U/L (ref 15–41)
Albumin: 1.7 g/dL — ABNORMAL LOW (ref 3.5–5.0)
Alkaline Phosphatase: 42 U/L (ref 38–126)
Anion gap: 6 (ref 5–15)
BUN: 16 mg/dL (ref 8–23)
CO2: 22 mmol/L (ref 22–32)
Calcium: 7.9 mg/dL — ABNORMAL LOW (ref 8.9–10.3)
Chloride: 102 mmol/L (ref 98–111)
Creatinine, Ser: 0.76 mg/dL (ref 0.61–1.24)
GFR, Estimated: >60 mL/min (ref >60)
Glucose, Bld: 131 mg/dL — ABNORMAL HIGH (ref 70–99)
Potassium: 3 mmol/L — ABNORMAL LOW (ref 3.5–5.1)
Sodium: 130 mmol/L — ABNORMAL LOW (ref 135–145)
Total Bilirubin: 0.7 mg/dL (ref 0.3–1.2)
Total Protein: 4.6 g/dL — ABNORMAL LOW (ref 6.5–8.1)

## 2021-10-27 LAB — PHOSPHORUS: Phosphorus: 2.1 mg/dL — ABNORMAL LOW (ref 2.5–4.6)

## 2021-10-27 LAB — TRIGLYCERIDES: Triglycerides: 180 mg/dL — ABNORMAL HIGH (ref <150)

## 2021-10-27 LAB — MAGNESIUM: Magnesium: 2 mg/dL (ref 1.7–2.4)

## 2021-10-27 LAB — SURGICAL PATHOLOGY

## 2021-10-27 LAB — BRAIN NATRIURETIC PEPTIDE: B Natriuretic Peptide: 144.4 pg/mL — ABNORMAL HIGH (ref 0.0–100.0)

## 2021-10-27 MED ORDER — TRAVASOL 10 % IV SOLN
INTRAVENOUS | Status: AC
  Filled 2021-10-27: qty 612

## 2021-10-27 MED ORDER — IOHEXOL 350 MG/ML SOLN
100.0000 mL | Freq: Once | INTRAVENOUS | Status: AC | PRN
  Administered 2021-10-27: 100 mL via INTRAVENOUS

## 2021-10-27 MED ORDER — ACETAMINOPHEN 10 MG/ML IV SOLN
1000.0000 mg | Freq: Four times a day (QID) | INTRAVENOUS | Status: AC
  Administered 2021-10-27 – 2021-10-28 (×4): 1000 mg via INTRAVENOUS
  Filled 2021-10-27 (×5): qty 100

## 2021-10-27 MED ORDER — SODIUM PHOSPHATES 45 MMOLE/15ML IV SOLN
15.0000 mmol | Freq: Once | INTRAVENOUS | Status: AC
  Administered 2021-10-27: 15 mmol via INTRAVENOUS
  Filled 2021-10-27: qty 5

## 2021-10-27 MED ORDER — FUROSEMIDE 10 MG/ML IJ SOLN
40.0000 mg | Freq: Once | INTRAMUSCULAR | Status: AC
  Administered 2021-10-27: 40 mg via INTRAVENOUS
  Filled 2021-10-27: qty 4

## 2021-10-27 MED ORDER — POTASSIUM CHLORIDE 10 MEQ/100ML IV SOLN
10.0000 meq | INTRAVENOUS | Status: AC
  Administered 2021-10-27 (×6): 10 meq via INTRAVENOUS
  Filled 2021-10-27 (×6): qty 100

## 2021-10-27 NOTE — Evaluation (Signed)
Occupational Therapy Evaluation ?Patient Details ?Name: Robert Barrett ?MRN: [id number] ?DOB: [birth year] ?Today's Date: 10/27/2021 ? ? ?History of Present Illness 76 y.o. male presented to the hospital 10/20/21 with abdominal pain. CT scan of his abdomen revealed perforated diverticulitis with a 6 cm abscess.    PMH significant of DM-2, HTN, HLD, CAD s/p CABG repeat CT scan was performed on 4/18 which showed worsening changes with moderate free intraperitoneal air, pneumatosis.  Patient was subsequently taken to the operating room by general surgery where he underwent right hemicolectomy, sigmoid colectomy and end ostomy placement  ? ?Clinical Impression ?  ?Pt currently at min assist for simulation of selfcare tasks and functional transfers without the use of an assistive device.  Oxygen sats maintained 90-94% on 8Ls high flow nasal canula.  HR did increase from 100 up to the upper 120's with standing and mobility.  Increased abdominal pain with transitional movements and LB dressing, but pt tolerating well with medication.  Feel he will benefit from acute care OT to progress to modified independent level for home.  He will have his spouse providing 24 hr supervision as needed.    ?   ? ?Recommendations for follow up therapy are one component of a multi-disciplinary discharge planning process, led by the attending physician.  Recommendations may be updated based on patient status, additional functional criteria and insurance authorization.  ? ?Follow Up Recommendations ? No OT follow up  ?  ?Assistance Recommended at Discharge PRN  ?Patient can return home with the following A little help with bathing/dressing/bathroom;Assistance with cooking/housework;Assist for transportation;Help with stairs or ramp for entrance ? ?  ?Functional Status Assessment ? Patient has had a recent decline in their functional status and demonstrates the ability to make significant improvements in function in a reasonable and predictable  amount of time.  ?Equipment Recommendations ? None recommended by OT  ?  ?   ?Precautions / Restrictions Precautions ?Precautions: Fall ?Restrictions ?Weight Bearing Restrictions: No  ? ?  ? ?Mobility Bed Mobility ?Overal bed mobility: Needs Assistance ?Bed Mobility: Supine to Sit, Sit to Supine ?  ?  ?Supine to sit: Min guard ?Sit to supine: Min assist ?  ?General bed mobility comments: Pt unable to control descent from sitting to supine secondary to abdominal pain. ?  ? ?Transfers ?Overall transfer level: Needs assistance ?Equipment used: None ?Transfers: Sit to/from Stand, Bed to chair/wheelchair/BSC ?Sit to Stand: Min assist ?  ?  ?Step pivot transfers: Min assist ?  ?  ?  ?  ? ?  ?Balance Overall balance assessment: Needs assistance ?  ?Sitting balance-Leahy Scale: Good ?  ?  ?Standing balance support: During functional activity ?Standing balance-Leahy Scale: Fair ?Standing balance comment: Pt with LOB posteriorly when stepping up to the top of the bed. ?  ?  ?  ?  ?  ?  ?  ?  ?  ?  ?  ?   ? ?ADL either performed or assessed with clinical judgement  ? ?ADL Overall ADL's : Needs assistance/impaired ?Eating/Feeding: Independent;Sitting ?  ?Grooming: Dance movement psychotherapist;Wash/dry hands;Set up;Sitting ?  ?Upper Body Bathing: Supervision/ safety;Sitting ?Upper Body Bathing Details (indicate cue type and reason): simulated ?Lower Body Bathing: Sit to/from stand;Minimal assistance ?  ?  ?  ?  ?  ?Toilet Transfer: Minimal assistance;Grab Haematologist;Ambulation ?Toilet Transfer Details (indicate cue type and reason): no assistive device ?Toileting- Clothing Manipulation and Hygiene: Minimal assistance;Sit to/from stand ?  ?  ?  ?Functional mobility during ADLs: Min  guard ?General ADL Comments: Pt with increased abdominal pain with movement and transitions and when bringing his feet up to donn his socks.  Oxygen sats remained at 90-94% on 8 Ls high flow with HR from 100 up to 125 with activity.  He reports having access  to a shower seat and already having a higher toilet at home.  ? ? ? ?Vision Baseline Vision/History: 1 Wears glasses ?Ability to See in Adequate Light: 0 Adequate ?Patient Visual Report: No change from baseline ?Vision Assessment?: No apparent visual deficits  ?   ?Perception Perception ?Perception: Within Functional Limits ?  ?Praxis Praxis ?Praxis: Intact ?  ? ?Pertinent Vitals/Pain Pain Assessment ?Pain Assessment: 0-10 ?Pain Score: 6  ?Pain Location: abdomen  ? ? ? ?Hand Dominance Right ?  ?Extremity/Trunk Assessment Upper Extremity Assessment ?Upper Extremity Assessment: Overall WFL for tasks assessed ?  ?Lower Extremity Assessment ?Lower Extremity Assessment: Defer to PT evaluation ?  ?Cervical / Trunk Assessment ?Cervical / Trunk Assessment: Normal ?  ?Communication Communication ?Communication: No difficulties ?  ?Cognition Arousal/Alertness: Awake/alert ?Behavior During Therapy: [facility] for tasks assessed/performed ?Overall Cognitive Status: Within Functional Limits for tasks assessed ?  ?  ?  ?  ?  ?  ?  ?  ?  ?  ?  ?  ?  ?  ?  ?  ?General Comments: Pt able to state place and reason for hospitalization.  He was incorrect on the day of the week.  Will continue to assess further in treatment as needed. ?  ?  ?   ?   ?   ? ? ?Home Living Family/patient expects to be discharged to:: Private residence ?Living Arrangements: Spouse/significant other ?Available Help at Discharge: Family ?Type of Home: House ?Home Access: Stairs to enter ?Entrance Stairs-Number of Steps: 1 ?Entrance Stairs-Rails: None ?Home Layout: One level ?  ?  ?Bathroom Shower/Tub: Walk-in shower ?  ?Bathroom Toilet: Standard ?Bathroom Accessibility: No ?  ?Home Equipment: None ?  ?  ?  ? ?  ?Prior Functioning/Environment Prior Level of Function : Independent/Modified Independent;Driving ?  ?  ?  ?  ?  ?  ?  ?  ?  ? ?  ?  ?OT Problem List: Decreased strength;Decreased activity tolerance;Impaired balance (sitting and/or  standing);Pain;Cardiopulmonary status limiting activity;Decreased knowledge of use of DME or AE ?  ?   ?OT Treatment/Interventions: Self-care/ADL training;DME and/or AE instruction;Energy conservation;Therapeutic activities;Patient/family education;Balance training  ?  ?OT Goals(Current goals can be found in the care plan section) Acute Rehab OT Goals ?Patient Stated Goal: Pt did not state but agreeable to participate in OT session. ?OT Goal Formulation: With patient ?Time For Goal Achievement: 11/10/21 ?Potential to Achieve Goals: Good  ?OT Frequency: Min 2X/week ?  ? ?AM-PAC OT "6 Clicks" Daily Activity     ?Outcome Measure Help from another person eating meals?: None ?Help from another person taking care of personal grooming?: A Little ?Help from another person toileting, which includes using toliet, bedpan, or urinal?: A Little ?Help from another person bathing (including washing, rinsing, drying)?: A Little ?Help from another person to put on and taking off regular upper body clothing?: None ?Help from another person to put on and taking off regular lower body clothing?: A Little ?6 Click Score: 20 ?  ?End of Session Equipment Utilized During Treatment: Gait belt;Oxygen ?Nurse Communication: Mobility status ? ?Activity Tolerance: Patient tolerated treatment well ?Patient left: in bed;with call bell/phone within reach;with bed alarm set ? ?OT Visit Diagnosis: Unsteadiness on  feet (R26.81);Muscle weakness (generalized) (M62.81)  ?              ?Time: [id number] ?OT Time Calculation (min): 41 min ?Charges:  OT General Charges ?$OT Visit: 1 Visit ?OT Evaluation ?$OT Eval Moderate Complexity: 1 Mod ?OT Treatments ?$Self Care/Home Management : 23-37 mins ? ?Cattaleya Wien OTR/L ?10/27/2021, 10:00 AM ?

## 2021-10-27 NOTE — Progress Notes (Signed)
Pt c/o SOB. Crackle lung sounds noted. O2 saturation decreased to 83s on 3L of [place]. Oxgyen increased to 5L via [place]. HOB elevated to 60 degrees. Congested cough noted. MD notified.  ?

## 2021-10-27 NOTE — Progress Notes (Signed)
TRH night cross cover note: ? ?I was notified by RN of patient's development of crackles on exam over the course of tonight's shift.  When questioned, patient reportedly acknowledges some mild shortness of breath in the absence of any chest pain.  In the setting of known obstructive sleep apnea on nocturnal CPAP, he has been noted to demonstrate intermittent hypoxia w/ O2 sats into the high 80s on his home CPAP settings.  Other vital signs notable for the following: Afebrile, heart rate in the 90s, normotensive blood pressure, respiratory rate in the low 20s.  He is reportedly postop day #2, status post exploratory laparotomy, raising the possibility of mobilization of third spacing fluid, increasing risk for consequential relative intravascular volume overload.  Of note, the patient is currently on continuous normal saline at 50 cc/h. ? ?Based upon the above, I have ordered chest x-ray, EKG, and BNP.  Additionally, I have confirmed order for incentive spirometry.  ? ? ? ?Babs Bertin, DO ?Hospitalist  ?

## 2021-10-27 NOTE — Progress Notes (Signed)
Physical Therapy Evaluation ?Patient Details ?Name: Robert Barrett ?MRN: [id number] ?DOB: [birth year] ?Today's Date: 10/27/2021 ? ?History of Present Illness ? 76 y.o. male presented to the hospital 10/20/21 with abdominal pain. CT scan of his abdomen revealed perforated diverticulitis with a 6 cm abscess. 4/18 s/p Exploratory laparotomy, right hemicolectomy, sigmoid colectomy with end colostomy    PMH significant of DM-2, HTN, HLD, CAD s/p CABG repeat CT scan was performed on 4/18 right hemicolectomy, sigmoid colectomy and end ostomy placement  ?Clinical Impression ?  ?Pt admitted secondary to problem above with deficits below. PTA patient was independent and living with wife. Pt currently requires min assist with mobility due to pain and decr sats with activity.  Anticipate patient will benefit from PT to address problems listed below.Will continue to follow acutely to maximize functional mobility independence and safety.   ?   ?   ? ?Recommendations for follow up therapy are one component of a multi-disciplinary discharge planning process, led by the attending physician.  Recommendations may be updated based on patient status, additional functional criteria and insurance authorization. ? ?Follow Up Recommendations No PT follow up ? ?  ?Assistance Recommended at Discharge PRN  ?Patient can return home with the following ? A little help with walking and/or transfers;Assistance with cooking/housework;Help with stairs or ramp for entrance ? ?  ?Equipment Recommendations Rolling walker (2 wheels) (may progress and NOT need)  ?Recommendations for Other Services ?    ?  ?Functional Status Assessment Patient has had a recent decline in their functional status and demonstrates the ability to make significant improvements in function in a reasonable and predictable amount of time.  ? ?  ?Precautions / Restrictions Precautions ?Precautions: Fall ?Precaution Comments: RLQ JP drain ?Restrictions ?Weight Bearing Restrictions: No   ? ?  ? ?Mobility ? Bed Mobility ?Overal bed mobility: Needs Assistance ?Bed Mobility: Rolling, Sidelying to Sit ?Rolling: Min assist ?Sidelying to sit: Min assist ?  ?  ?  ?General bed mobility comments: instructed in rolling to his side for decr strain on abdominal muscles; +use of rail ?  ? ?Transfers ?Overall transfer level: Needs assistance ?Equipment used: Rolling walker (2 wheels) ?Transfers: Sit to/from Stand, Bed to chair/wheelchair/BSC ?Sit to Stand: Min assist ?  ?  ?  ?  ?  ?  ?  ? ?Ambulation/Gait ?Ambulation/Gait assistance: Min assist ?Gait Distance (Feet): 170 Feet ?Assistive device: Rolling walker (2 wheels) ?Gait Pattern/deviations: Step-to pattern, Decreased stride length, Trunk flexed ?Gait velocity: too fast at times for situation (due to pushing RW too far ahead) ?  ?  ?General Gait Details: occasional assist to steer/direct RW; vc and assist to stay close to RW ? ?Stairs ?  ?  ?  ?  ?  ? ?Wheelchair Mobility ?  ? ?Modified Rankin (Stroke Patients Only) ?  ? ?  ? ?Balance Overall balance assessment: Needs assistance ?Sitting-balance support: No upper extremity supported, Feet supported ?Sitting balance-Leahy Scale: Fair ?  ?  ?Standing balance support: During functional activity ?Standing balance-Leahy Scale: Fair ?  ?  ?  ?  ?  ?  ?  ?  ?  ?  ?  ?  ?   ? ? ? ?Pertinent Vitals/Pain Pain Assessment ?Pain Assessment: 0-10 ?Pain Score: 6  ?Pain Location: abdomen ?Pain Descriptors / Indicators: Tightness ?Pain Intervention(s): Limited activity within patient's tolerance, Monitored during session  ? ? ?Home Living Family/patient expects to be discharged to:: Private residence ?Living Arrangements: Spouse/significant other ?Available Help at Discharge:  Family ?Type of Home: House ?Home Access: Stairs to enter ?Entrance Stairs-Rails: None ?Entrance Stairs-Number of Steps: 1 ?  ?Home Layout: One level ?Home Equipment: None ?   ?  ?Prior Function Prior Level of Function : Independent/Modified  Independent;Driving ?  ?  ?  ?  ?  ?  ?  ?  ?  ? ? ?Hand Dominance  ? Dominant Hand: Right ? ?  ?Extremity/Trunk Assessment  ? Upper Extremity Assessment ?Upper Extremity Assessment: Overall WFL for tasks assessed ?  ? ?Lower Extremity Assessment ?Lower Extremity Assessment: Overall WFL for tasks assessed ?  ? ?Cervical / Trunk Assessment ?Cervical / Trunk Assessment: Normal  ?Communication  ? Communication: No difficulties  ?Cognition Arousal/Alertness: Awake/alert ?Behavior During Therapy: [facility] for tasks assessed/performed ?Overall Cognitive Status: Within Functional Limits for tasks assessed ?  ?  ?  ?  ?  ?  ?  ?  ?  ?  ?  ?  ?  ?  ?  ?  ?General Comments: Pt able to state place and reason for hospitalization.  . ?  ?  ? ?  ?General Comments General comments (skin integrity, edema, etc.): Family present. Initially on 3L at rest with sats 92%; decr to 85% on 3L with standing and incr to 6L to maintain sats 89% while walking ? ?  ?Exercises    ? ?Assessment/Plan  ?  ?PT Assessment Patient needs continued PT services  ?PT Problem List Decreased balance;Decreased mobility;Decreased knowledge of use of DME;Decreased knowledge of precautions;Cardiopulmonary status limiting activity;Pain;Obesity ? ?   ?  ?PT Treatment Interventions DME instruction;Gait training;Functional mobility training;Therapeutic activities;Therapeutic exercise;Patient/family education   ? ?PT Goals (Current goals can be found in the Care Plan section)  ?Acute Rehab PT Goals ?Patient Stated Goal: decr pain and go home ?PT Goal Formulation: With patient ?Time For Goal Achievement: 11/10/21 ?Potential to Achieve Goals: Good ? ?  ?Frequency Min 3X/week ?  ? ? ?Co-evaluation   ?  ?  ?  ?  ? ? ?  ?AM-PAC PT "6 Clicks" Mobility  ?Outcome Measure Help needed turning from your back to your side while in a flat bed without using bedrails?: A Little ?Help needed moving from lying on your back to sitting on the side of a flat bed without using bedrails?: A  Little ?Help needed moving to and from a bed to a chair (including a wheelchair)?: A Little ?Help needed standing up from a chair using your arms (e.g., wheelchair or bedside chair)?: A Little ?Help needed to walk in hospital room?: A Little ?Help needed climbing 3-5 steps with a railing? : A Little ?6 Click Score: 18 ? ?  ?End of Session Equipment Utilized During Treatment: Oxygen ?Activity Tolerance: Patient tolerated treatment well ?Patient left: with call bell/phone within reach;with family/visitor present;in chair ?Nurse Communication: Mobility status ?PT Visit Diagnosis: Difficulty in walking, not elsewhere classified (R26.2);Pain ?Pain - Right/Left:  (midline incision) ?Pain - part of body:  (abdomen) ?  ? ?Time: 1245-1316 ?PT Time Calculation (min) (ACUTE ONLY): 31 min ? ? ?Charges:   PT Evaluation ?$PT Eval Low Complexity: 1 Low ?PT Treatments ?$Gait Training: 8-22 mins ?  ?   ? ? ? ?Jerolyn Center, PT ?Acute Rehabilitation Services  ?Pager [phone] ?Office [phone] ? ? ?Scherrie November Farris Geiman ?10/27/2021, 1:32 PM ?

## 2021-10-27 NOTE — Progress Notes (Signed)
PT refusing Cpap saying it choked him and he couldn't sleep last night. No resp distress noted.  ?

## 2021-10-27 NOTE — Progress Notes (Signed)
2 Days Post-Op  ? ?Subjective/Chief Complaint: ?Decreased sats responsive to some IS and getting lasix, having ostomy output, tol liquids ? ? ?Objective: ?Vital signs in last 24 hours: ?Temp:  [97.6 ?F (36.4 ?C)-98.9 ?F (37.2 ?C)] 97.6 ?F (36.4 ?C) (04/20 0729) ?Pulse Rate:  [84-99] 99 (04/20 0729) ?Resp:  [11-23] 18 (04/20 0729) ?BP: (108-124)/(59-94) 124/94 (04/20 0729) ?SpO2:  [86 %-96 %] 90 % (04/20 0729) ?Last BM Date : 10/26/21 ? ?Intake/Output from previous day: ?04/19 0701 - 04/20 0700 ?In: 1310.6 [I.V.:510.6; IV Piggyback:800] ?Out: 1450 [Urine:900; Drains:400; Stool:150] ?Intake/Output this shift: ?Total I/O ?In: -  ?Out: 225 [Stool:225] ? ?GI: soft approp tender wound clean stool in bag, jp serous ?GU yellow urine ? ?Lab Results:  ?Recent Labs  ?  10/26/21 ?1303 10/27/21 ?0405  ?WBC 17.3* 14.7*  ?HGB 12.7* 11.2*  ?HCT 37.5* 33.7*  ?PLT 663* 629*  ? ?BMET ?Recent Labs  ?  10/26/21 ?0122 10/27/21 ?0405  ?NA 131* 130*  ?K 3.8 3.0*  ?CL 103 102  ?CO2 18* 22  ?GLUCOSE 160* 131*  ?BUN 12 16  ?CREATININE 0.97 0.76  ?CALCIUM 8.2* 7.9*  ? ?PT/INR ?No results for input(s): LABPROT, INR in the last 72 hours. ?ABG ?No results for input(s): PHART, HCO3 in the last 72 hours. ? ?Invalid input(s): PCO2, PO2 ? ?Studies/Results: ?CT ABDOMEN PELVIS W CONTRAST ? ?Result Date: 10/25/2021 ?CLINICAL DATA:  Follow-up diverticulitis and abscess. EXAM: CT ABDOMEN AND PELVIS WITH CONTRAST TECHNIQUE: Multidetector CT imaging of the abdomen and pelvis was performed using the standard protocol following bolus administration of intravenous contrast. RADIATION DOSE REDUCTION: This exam was performed according to the departmental dose-optimization program which includes automated exposure control, adjustment of the mA and/or kV according to patient size and/or use of iterative reconstruction technique. CONTRAST:  166mL OMNIPAQUE IOHEXOL 300 MG/ML  SOLN COMPARISON:  10/20/2021 FINDINGS: Lower Chest: New small bilateral pleural effusions  and dependent atelectasis. Hepatobiliary: No hepatic masses identified. Gallbladder is unremarkable. No evidence of biliary ductal dilatation. Pancreas:  No mass or inflammatory changes. Spleen: Within normal limits in size and appearance. Adrenals/Urinary Tract: No masses identified. Stable small right renal cysts (no followup imaging is recommended.) no evidence of ureteral calculi or hydronephrosis. Unremarkable unopacified urinary bladder. Stomach/Bowel: Moderate to severe sigmoid diverticulosis is again seen. Previously seen loculated fluid and gas collection in the central pelvis is no longer visualized, however there is a moderate amount of free intraperitoneal air consistent with bowel perforation. Diffuse small bowel and colonic dilatation has significantly increased, with cecum measuring 12 cm in diameter, consistent with severe ileus. Mild ascites is new since previous study. Small hiatal hernia again noted. Vascular/Lymphatic: No pathologically enlarged lymph nodes. No acute vascular findings. Aortic atherosclerotic calcification noted. Reproductive:  Stable moderately enlarged prostate. Other:  None. Musculoskeletal:  No suspicious bone lesions identified. IMPRESSION: New moderate free intraperitoneal air, consistent with sigmoid colon perforation. Moderate to severe sigmoid diverticulitis. Severe ileus, with dilated cecum which measures 12 cm and demonstrates pneumatosis. This raises suspicion for ischemia. No evidence of portal venous gas. Mild ascites.  No abscess identified. New small bilateral pleural effusions and dependent atelectasis. Small hiatal hernia. Stable moderately enlarged prostate. Aortic Atherosclerosis (ICD10-I70.0). Critical Value/emergent results were called by telephone at the time of interpretation on 10/25/2021 at 4:49 pm to provider Dr. Sloan Leiter, who verbally acknowledged these results. Electronically Signed   By: Marlaine Hind M.D.   On: 10/25/2021 16:55  ? ?DG CHEST PORT 1  VIEW ? ?Result Date:  10/27/2021 ?CLINICAL DATA:  Short of breath EXAM: PORTABLE CHEST 1 VIEW COMPARISON:  CT 10/25/2021 FINDINGS: Sternotomy wires overlie normal cardiac silhouette. RIGHT lobe atelectasis similar to comparison CT. Low lung volumes. No pneumothorax. PICC line with tip in the distal SVC IMPRESSION: 1. RIGHT basilar atelectasis. 2. Low lung volumes. Electronically Signed   By: Suzy Bouchard M.D.   On: 10/27/2021 08:12  ? ?[place] EKG SITE RITE ? ?Result Date: 10/26/2021 ?If [employer] not attached, placement could not be confirmed due to current cardiac rhythm.  ? ?Anti-infectives: ?Anti-infectives (From admission, onward)  ? ? Start     Dose/Rate Route Frequency Ordered Stop  ? 10/20/21 2300  piperacillin-tazobactam (ZOSYN) IVPB 3.375 g       ? 3.375 g ?12.5 mL/hr over 240 Minutes Intravenous Every 8 hours 10/20/21 1854    ? 10/20/21 1700  piperacillin-tazobactam (ZOSYN) IVPB 3.375 g       ? 3.375 g ?100 mL/hr over 30 Minutes Intravenous  Once 10/20/21 1656 10/20/21 1745  ? ?  ? ? ?Assessment/Plan: ?-POD#2 s/p Exploratory laparotomy, right hemicolectomy, sigmoid colectomy with end colostomy 4/18 Dr. Kae Heller ?- intraop findings: Large bowel obstruction with perforated cecum and diffuse feculent peritonitis, sigmoid diverticulitis with dense inflammatory adhesions to the bladder as well as associated localized abscess ?- path pending ?- continue drain, currently serosanguinous ?- continue IV antibiotics for 5 days postop ?- TID wet to dry dressing changes to midline abdominal wound ?- WOC consult for new colostomy ?- continue scheduled IV tylenol for improved pain control ?- will try fulls but continue TPN until sure he is tolerating due to prolonged npo ? -discussed oxygen saturations with TRH ? ?ID - zosyn 4/13>> ?FEN - IVF, TPN, fulls ?VTE - lovenox ?Foley - continue for strict I&Os today and from surgery ?  ?ABL anemia ?T2DM ?HTN ?HLD ?CAD s/p CABG ?GERD ?Anxiety ?OSA ? ?Rolm Bookbinder ?10/27/2021 ? ?

## 2021-10-27 NOTE — Progress Notes (Signed)
PHARMACY - TOTAL PARENTERAL NUTRITION CONSULT NOTE  ? ?Indication: Large bowel obstruction with perforated cecum, NPO > 7 days ? ?Patient Measurements: ?Height: '5\' 10"'  (177.8 cm) ?Weight: 82.7 kg (182 lb 5.1 oz) ?IBW/kg (Calculated) : 73 ?  ?Body mass index is 26.16 kg/m?. ?Usual Weight: 95 kg ? ?Assessment: [name] with past medical history of T2DM, HTN, HLD, CAD s/p CABG who presented on 4/13 to the emergency department with colicky abdominal pain and nausea. CT from 4/13 showed perforated diverticulitis with a 6 mm abscess at which time he was made NPO. IR was unable to drain abscess, however, pt began improving on Zosyn. Pt was given a CLD on 4/15-4/18. Repeat CT on 4/18 showed new moderate free intraperitoneal air c/w sigmoid colon perforation, moderate-severe sigmoid diverticulitis, severe ileus w/ dilated cecum demonstrating penumatosis. These findings lead to emergent surgery on 4/18 - ex-lap  R hemicolectomy, sigmoid colectomy with end colostomy creation. ? ?Prior to admission, patient reports a very poor appetite over the past 6 months. He has noted a gradual weight loss over that time to 197 lb prior to admission. He reports only being able to tolerate a few juices while on the CLD since admission. Pharmacy consulted to initiate and mange TPN on 4/19. Full liquid diet was started on 10/27/21.  ? ?Glucose / Insulin: BG < 180, sSSI (5u given in last 24h) ?Electrolytes: Na 130, K 3, coCa 9.4, Phos 2.1, Mag 2.0, others wnl ?Renal: SCr 0.76, BUN wnl ?Hepatic: LFTs/ Alk Phos/ T.bili wnl, TG 180, Albumin 1.7 ?Intake / Output; MIVF: RLQ pelvic drain output: 100 mL, ostomy output: none, UOP: 351m (documentation started post-op) ?MIVF: NS @ 55 ml/hr - d/c'd at 07:11 4/20 ?GI Imaging: None since surgery ?GI Surgeries / Procedures:  ?4/18 - ex-lap  R hemicolectomy, sigmoid colectomy with end colostomy creation ? ?Central access: double-lumen PICC placed 10/26/21 ?TPN start date: 10/26/21 ? ?Nutritional Goals: ?Goal TPN  rate is 90 mL/hr (provides 110 g of protein and 2160 kcals per day) ? ?RD Assessment: ?Estimated Needs ?Total Energy Estimated Needs: 2000-2200 kcal/d ?Total Protein Estimated Needs: 100-110 g/d ?Total Fluid Estimated Needs: 2-2.2 L/d ? ?Current Nutrition:  ?Full liquids and TPN ? ?Plan:  ?Increase TPN to 575mhr at 1800 (55% of goal rate, providing 61.2g of protein and 1200 kcal/d) ?Increasing TPN gradually due to signs of refeeding syndrome with electrolyte abnormalities.  ?Electrolytes in TPN: Change: Na to 14076mL, K to 55m50m, Ca to 6mEq2m Mg to 8mEq/55mand Phos to 25mmol39mCl:Ac 1:2 ?Supplement sodium phosphate 15mmol 60m1 and KCl 10mEq x 84m0mEq tot1moutside of TPN ?Add standard MVI and trace elements to TPN ?Add thiamine to TPN due to refeeding syndrome.  ?Initiate Sensitive q6h SSI and adjust as needed  ?No MIVF, pt transitioned to FLD on 4/20 - defer MIVF management to primary team ?Monitor TPN labs daily until stable, then on Mon/Thurs. ? ?Hill Mackie G JaKaleen Mask3,9:25 AM ? ?

## 2021-10-27 NOTE — Progress Notes (Addendum)
?      ?                 PROGRESS NOTE ? ?      ?PATIENT DETAILS ?Name: Robert Barrett ?Age: 76 y.o. ?Sex: male ?Date of Birth: [birth year] ?Admit Date: 10/20/2021 ?Admitting Physician Maretta Bees, MD ?[name] is a 76 y.o.  male with past medical history of DM-2, HTN, HLD, CAD s/p CABG-presented with abdominal pain-found to have perforated diverticulitis with abscess.  Patient was admitted to the hospitalist service-started on Zosyn and other supportive care-General surgery followed closely, repeat CT scan was performed on 4/18 which showed worsening changes with moderate free intraperitoneal air, pneumatosis.  Patient was subsequently taken to the operating room by general surgery where he underwent right hemicolectomy, sigmoid colectomy and end ostomy placement.  See below for further details. ? ?Significant events: ?4/13>> admit to [facility] for perforated diverticulitis with 6 cm abscess. ?4/14>> per IR-no appropriate window to place drain. ?4/18>> repeat CT abdomen worsening free air/pneumatosis-surgery performed right hemicolectomy/sigmoid colectomy and end ostomy placement. ?4/19>> mild hypotension that responded to IVF. ?4/20>> developed hypoxemia requiring around 8 L of O2.  CTA chest negative for PE. ? ?Significant studies: ?4/13>> CT abdomen/pelvis: Perforated diverticulitis-6 cm abscess.  Upstream distention of large bowel-concerning for secondary large bowel obstruction. ?4/18>> CT abdomen/pelvis: New moderate free intraperitoneal air, severe ileus, dilated cecum with pneumatosis ? ?Significant microbiology data: ?None ? ?Procedures: ?None ? ?Consults: ?General surgery ? ?Subjective: ?Much better-Down to 3 L of oxygen. ? ?Objective: ?Vitals: ?Blood pressure 112/68, pulse 96, temperature (!) 97.5 ?F (36.4 ?C), temperature source Oral, resp. rate 18, height 5\' 10"  (1.778 m), weight 82.7 kg, SpO2 96 %.  ? ?Exam: ?Gen Exam:Alert awake-not in any  distress ?HEENT:atraumatic, normocephalic ?Chest: B/L clear to auscultation anteriorly ?CVS:S1S2 regular ?Abdomen:soft-midline dressing in place-appropriately tender. ?Extremities:no edema ?Neurology: Non focal ?Skin: no rash  ? ?Pertinent Labs/Radiology: ? ?  Latest Ref Rng & Units 10/27/2021  ?  4:05 AM 10/26/2021  ?  1:03 PM 10/26/2021  ?  7:50 AM  ?CBC  ?WBC 4.0 - 10.5 K/uL 14.7   17.3   15.2    ?Hemoglobin 13.0 - 17.0 g/dL 10/28/2021   97.2   82.0    ?Hematocrit 39.0 - 52.0 % 33.7   37.5   36.0    ?Platelets 150 - 400 K/uL 629   663   615    ?  ?Lab Results  ?Component Value Date  ? NA 130 (L) 10/27/2021  ? K 3.0 (L) 10/27/2021  ? CL 102 10/27/2021  ? CO2 22 10/27/2021  ? ?  ? ? ?Assessment/Plan: ?Perforated diverticulitis-s/p exploratory laparotomy-right hemicolectomy/sigmoid colectomy with end ostomy on 4/18: Stable-General surgery following and directing care-remains on Zosyn.   ? ?Acute hypoxic respiratory failure: Likely due to atelectasis/possible PNA-on Zosyn.  Was on 8 L of oxygen yesterday-has improved with aggressive pulmonary toileting/use of incentive spirometry-on 3 L of oxygen.  CTA chest negative for PE.  Continue mobilization-and other supportive care.  ? ?Hyponatremia: Mild-continue to follow. ? ?Hypokalemia: Continue to replete. ? ?DM-2 (A1c 6.0 on 4/14): Diet controlled at home-monitor with SSI.  ? ?Recent Labs  ?  10/27/21 ?0444 10/27/21 ?0745 10/27/21 ?1137  ?GLUCAP 131* 144* 155*  ? ?  ?  ?HTN: Had brief hypotension postoperatively-BP now stable-but continues to be off all antihypertensives for now.  Resume when able.   ? ?HLD: Resume fenofibrate/statin  ? ?CAD  s/p CABG approximately 10 years ago: No anginal symptoms-resume aspirin/statin ?  ?GERD: Continue PPI ?  ?Anxiety: Continue as needed Klonopin ?  ?Insomnia: Continue as needed temazepam ? ?OSA: CPAP nightly ? ?BMI: ?Estimated body mass index is 26.16 kg/m? as calculated from the following: ?  Height as of this encounter: 5\' 10"  (1.778 m). ?   Weight as of this encounter: 82.7 kg.  ? ?Code status: ?  Code Status: Full Code  ? ?DVT Prophylaxis: ?enoxaparin (LOVENOX) injection 40 mg Start: 10/21/21 1200 ?SCDs Start: 10/20/21 1800 ?Start prophylactic Lovenox 4/14 ?  ?Family Communication: Spouse-Pamela-[phone]-at bedside on 4/20. ? ? ?Disposition Plan: ?Status is: Inpatient ?Remains inpatient appropriate because: Perforated diverticulitis s/p laparotomy with colectomy and ostomy placement-not yet stable for discharge. ?Planned Discharge Destination:Home versus SNF ? ? ?Diet: ?Diet Order   ? ?       ?  Diet full liquid Room service appropriate? Yes; Fluid consistency: Thin  Diet effective now       ?  ? ?  ?  ? ?  ?  ? ? ?Antimicrobial agents: ?Anti-infectives (From admission, onward)  ? ? Start     Dose/Rate Route Frequency Ordered Stop  ? 10/20/21 2300  piperacillin-tazobactam (ZOSYN) IVPB 3.375 g       ? 3.375 g ?12.5 mL/hr over 240 Minutes Intravenous Every 8 hours 10/20/21 1854 10/30/21 2359  ? 10/20/21 1700  piperacillin-tazobactam (ZOSYN) IVPB 3.375 g       ? 3.375 g ?100 mL/hr over 30 Minutes Intravenous  Once 10/20/21 1656 10/20/21 1745  ? ?  ? ? ? ?MEDICATIONS: ?Scheduled Meds: ? Chlorhexidine Gluconate Cloth  6 each Topical Daily  ? enoxaparin (LOVENOX) injection  40 mg Subcutaneous Q24H  ? feeding supplement  1 Container Oral TID BM  ? fluticasone  2 spray Each Nare QHS  ? insulin aspart  0-9 Units Subcutaneous Q6H  ? pantoprazole (PROTONIX) IV  40 mg Intravenous Q12H  ? sodium chloride flush  10-40 mL Intracatheter Q12H  ? ?Continuous Infusions: ? acetaminophen 1,000 mg (10/27/21 1201)  ? methocarbamol (ROBAXIN) IV    ? piperacillin-tazobactam (ZOSYN)  IV 3.375 g (10/27/21 0839)  ? potassium chloride 10 mEq (10/27/21 1158)  ? sodium phosphate  Dextrose 5% IVPB 15 mmol (10/27/21 1201)  ? TPN ADULT (ION) 40 mL/hr at 10/26/21 1803  ? TPN ADULT (ION)    ? ?PRN Meds:.albuterol, artificial tears, clonazePAM, HYDROmorphone (DILAUDID) injection,  methocarbamol (ROBAXIN) IV, ondansetron **OR** ondansetron (ZOFRAN) IV, oxyCODONE, sodium chloride flush, temazepam ? ? ?I have personally reviewed following labs and imaging studies ? ?LABORATORY DATA: ?CBC: ?Recent Labs  ?Lab 10/22/21 ?[id number] 10/23/21 ?[id number] 10/25/21 ?[id number] 10/26/21 ?0122 10/26/21 ?[id number] 10/26/21 ?1303 10/27/21 ?0405  ?WBC 11.1*   < > 18.1* 14.6* 15.2* 17.3* 14.7*  ?NEUTROABS 8.8*  --   --   --   --   --   --   ?HGB 12.8*   < > 13.2 14.8 11.8* 12.7* 11.2*  ?HCT 38.5*   < > 40.2 44.0 36.0* 37.5* 33.7*  ?MCV 90.4   < > 89.5 90.5 91.4 90.4 91.3  ?PLT 507*   < > 613* 848* 615* 663* 629*  ? < > = values in this interval not displayed.  ? ? ? ?Basic Metabolic Panel: ?Recent Labs  ?Lab 10/23/21 ?[id number] 10/24/21 ?[id number] 10/25/21 ?[id number] 10/26/21 ?0122 10/27/21 ?0405  ?NA 131* 136 131* 131* 130*  ?K 3.4* 4.5 3.4* 3.8 3.0*  ?CL 102 107 102 103 102  ?  CO2 21* 24 19* 18* 22  ?GLUCOSE 114* 119* 176* 160* 131*  ?BUN 5* 5* 5* 12 16  ?CREATININE 0.66 0.70 0.78 0.97 0.76  ?CALCIUM 8.6* 9.0 8.4* 8.2* 7.9*  ?MG  --  1.5* 2.4  --  2.0  ?PHOS  --   --   --   --  2.1*  ? ? ? ?GFR: ?Estimated Creatinine Clearance: 81.1 mL/min (by C-G formula based on SCr of 0.76 mg/dL). ? ?Liver Function Tests: ?Recent Labs  ?Lab 10/21/21 ?0320 10/27/21 ?0405  ?AST 18 27  ?ALT 8 16  ?ALKPHOS 51 42  ?BILITOT 0.6 0.7  ?PROT 6.2* 4.6*  ?ALBUMIN 2.6* 1.7*  ? ? ?No results for input(s): LIPASE, AMYLASE in the last 168 hours. ? ?No results for input(s): AMMONIA in the last 168 hours. ? ?Coagulation Profile: ?No results for input(s): INR, PROTIME in the last 168 hours. ? ?Cardiac Enzymes: ?No results for input(s): CKTOTAL, CKMB, CKMBINDEX, TROPONINI in the last 168 hours. ? ?BNP (last 3 results) ?No results for input(s): PROBNP in the last 8760 hours. ? ?Lipid Profile: ?Recent Labs  ?  10/27/21 ?0405  ?TRIG 180*  ? ? ?Thyroid Function Tests: ?No results for input(s): TSH, T4TOTAL, FREET4, T3FREE, THYROIDAB in the last 72 hours. ? ?Anemia Panel: ?No  results for input(s): VITAMINB12, FOLATE, FERRITIN, TIBC, IRON, RETICCTPCT in the last 72 hours. ? ?Urine analysis: ?   ?Component Value Date/Time  ? COLORURINE YELLOW 10/20/2021 0855  ? APPEARANCEUR HAZY (A) 10/20/2021 0855  ?

## 2021-10-27 NOTE — Consult Note (Addendum)
[facility] Nurse ostomy follow up ?Patient receiving care in [facility] 5W20 ?Stoma type/location: LMQ colostomy ?Stomal assessment/size: 1 1/2" oval skin level in a crease ?Peristomal assessment: intact ?Treatment options for stomal/peristomal skin: skin barrier ring ?Output: thin brown 225cc emptied from pouch ?Ostomy pouching: 2pc. 2 1/4 pouch Kellie Simmering # [phone]) Skin barrier Kellie Simmering # [phone]) Barrier ring Kellie Simmering # [phone]) ?Education provided:  ?Explained role of ostomy nurse and creation of stoma  ?Explained stoma characteristics (budded, flush, color, texture, care) ?Demonstrated pouch change (cutting new skin barrier, measuring stoma, cleaning peristomal skin and stoma, use of barrier ring) ?Education on emptying when 1/3 to 1/2 full and how to empty ?Demonstrated "burping" flatus from pouch ?Demonstrated use of wick to clean spout  ?Discussed treatment of peristomal skin (ostomy powder, skin barrier wipes) ?Answered patient/family questions:  will stop in to see patient again tomorrow @ 1300 for teaching session with patient and wife. ? ?Enrolled patient in [facility] [place] Discharge program: Yes ?Welcome to [employer]! ?Thank you for choosing to enroll. A member of our team will be in touch shortly to explain our services, which include: ?Personalized support from a Contractor ?Product, supplier, and insurance information ?Condition-specific information and connection to community resources ?If you have questions now, or in the future, please call us at [phone]. ? ?I recommend/discussed the [facility] outpatient ostomy clinic as an outpatient resource.  IF MD agrees this would be beneficial, please fax referral, or enter electronically in Epic the referral. (Fax- [phone])  ? ?WOC will follow for teaching and education until discharge. ? ?Cathlean Marseilles. Tamala Julian, MSN, RN, CMSRN, AGCNS, WTA ?Wound Treatment Associate ?Pager [phone]  ?

## 2021-10-28 ENCOUNTER — Other Ambulatory Visit: Payer: Self-pay | Admitting: Interventional Cardiology

## 2021-10-28 DIAGNOSIS — I10 Essential (primary) hypertension: Secondary | ICD-10-CM | POA: Diagnosis not present

## 2021-10-28 DIAGNOSIS — F419 Anxiety disorder, unspecified: Secondary | ICD-10-CM | POA: Diagnosis not present

## 2021-10-28 DIAGNOSIS — K219 Gastro-esophageal reflux disease without esophagitis: Secondary | ICD-10-CM | POA: Diagnosis not present

## 2021-10-28 DIAGNOSIS — K5720 Diverticulitis of large intestine with perforation and abscess without bleeding: Secondary | ICD-10-CM | POA: Diagnosis not present

## 2021-10-28 LAB — CBC
HCT: 30.5 % — ABNORMAL LOW (ref 39.0–52.0)
Hemoglobin: 9.9 g/dL — ABNORMAL LOW (ref 13.0–17.0)
MCH: 29.7 pg (ref 26.0–34.0)
MCHC: 32.5 g/dL (ref 30.0–36.0)
MCV: 91.6 fL (ref 80.0–100.0)
Platelets: 568 10*3/uL — ABNORMAL HIGH (ref 150–400)
RBC: 3.33 MIL/uL — ABNORMAL LOW (ref 4.22–5.81)
RDW: 14 % (ref 11.5–15.5)
WBC: 15.2 10*3/uL — ABNORMAL HIGH (ref 4.0–10.5)
nRBC: 0.1 % (ref 0.0–0.2)

## 2021-10-28 LAB — BASIC METABOLIC PANEL
Anion gap: 8 (ref 5–15)
BUN: 9 mg/dL (ref 8–23)
CO2: 22 mmol/L (ref 22–32)
Calcium: 8 mg/dL — ABNORMAL LOW (ref 8.9–10.3)
Chloride: 103 mmol/L (ref 98–111)
Creatinine, Ser: 0.7 mg/dL (ref 0.61–1.24)
GFR, Estimated: >60 mL/min (ref >60)
Glucose, Bld: 166 mg/dL — ABNORMAL HIGH (ref 70–99)
Potassium: 3.5 mmol/L (ref 3.5–5.1)
Sodium: 133 mmol/L — ABNORMAL LOW (ref 135–145)

## 2021-10-28 LAB — GLUCOSE, CAPILLARY
Glucose-Capillary: 131 mg/dL — ABNORMAL HIGH (ref 70–99)
Glucose-Capillary: 153 mg/dL — ABNORMAL HIGH (ref 70–99)
Glucose-Capillary: 117 mg/dL — ABNORMAL HIGH (ref 70–99)
Glucose-Capillary: 148 mg/dL — ABNORMAL HIGH (ref 70–99)
Glucose-Capillary: 177 mg/dL — ABNORMAL HIGH (ref 70–99)
Glucose-Capillary: 129 mg/dL — ABNORMAL HIGH (ref 70–99)

## 2021-10-28 LAB — MAGNESIUM: Magnesium: 1.8 mg/dL (ref 1.7–2.4)

## 2021-10-28 LAB — PHOSPHORUS: Phosphorus: 1.6 mg/dL — ABNORMAL LOW (ref 2.5–4.6)

## 2021-10-28 MED ORDER — ROSUVASTATIN CALCIUM 5 MG PO TABS
10.0000 mg | ORAL_TABLET | Freq: Every day | ORAL | Status: DC
  Administered 2021-10-29 – 2021-11-04 (×7): 10 mg via ORAL
  Filled 2021-10-28 (×9): qty 2

## 2021-10-28 MED ORDER — FENOFIBRATE 160 MG PO TABS
160.0000 mg | ORAL_TABLET | Freq: Every day | ORAL | Status: DC
  Administered 2021-10-29 – 2021-11-04 (×7): 160 mg via ORAL
  Filled 2021-10-28 (×8): qty 1

## 2021-10-28 MED ORDER — SODIUM CHLORIDE 0.9 % IV SOLN
INTRAVENOUS | Status: DC | PRN
Start: 2021-10-28 — End: 2021-11-04

## 2021-10-28 MED ORDER — TRAVASOL 10 % IV SOLN
INTRAVENOUS | Status: AC
  Filled 2021-10-28: qty 612

## 2021-10-28 MED ORDER — POTASSIUM PHOSPHATES 15 MMOLE/5ML IV SOLN
25.0000 mmol | Freq: Once | INTRAVENOUS | Status: AC
  Administered 2021-10-28: 25 mmol via INTRAVENOUS
  Filled 2021-10-28: qty 8.33

## 2021-10-28 MED ORDER — MAGNESIUM SULFATE 2 GM/50ML IV SOLN
2.0000 g | Freq: Once | INTRAVENOUS | Status: AC
  Administered 2021-10-28: 2 g via INTRAVENOUS
  Filled 2021-10-28: qty 50

## 2021-10-28 MED ORDER — ACETAMINOPHEN 500 MG PO TABS
1000.0000 mg | ORAL_TABLET | Freq: Four times a day (QID) | ORAL | Status: DC
  Administered 2021-10-28 – 2021-11-04 (×21): 1000 mg via ORAL
  Filled 2021-10-28 (×25): qty 2

## 2021-10-28 MED ORDER — ASPIRIN EC 81 MG PO TBEC
81.0000 mg | DELAYED_RELEASE_TABLET | Freq: Every day | ORAL | Status: DC
  Administered 2021-10-29 – 2021-11-04 (×7): 81 mg via ORAL
  Filled 2021-10-28 (×8): qty 1

## 2021-10-28 NOTE — Progress Notes (Addendum)
?      ?                 PROGRESS NOTE ? ?      ?PATIENT DETAILS ?Name: Robert Barrett ?Age: 76 y.o. ?Sex: male ?Date of Birth: August 26, 1945 ?Admit Date: 10/20/2021 ?Admitting Physician Maretta Bees, MD ?[name] is a 76 y.o.  male with past medical history of DM-2, HTN, HLD, CAD s/p CABG-presented with abdominal pain-found to have perforated diverticulitis with abscess.  Patient was admitted to the hospitalist service-started on Zosyn and other supportive care-General surgery followed closely, repeat CT scan was performed on 4/18 which showed worsening changes with moderate free intraperitoneal air, pneumatosis.  Patient was subsequently taken to the operating room by general surgery where he underwent right hemicolectomy, sigmoid colectomy and end ostomy placement.  See below for further details. ? ?Significant events: ?4/13>> admit to [facility] for perforated diverticulitis with 6 cm abscess. ?4/14>> per IR-no appropriate window to place drain. ?4/18>> repeat CT abdomen worsening free air/pneumatosis-surgery performed right hemicolectomy/sigmoid colectomy and end ostomy placement. ?4/19>> mild hypotension that responded to IVF. ?4/20>> developed hypoxemia requiring around 8 L of O2.  CTA chest negative for PE. ? ?Significant studies: ?4/13>> CT abdomen/pelvis: Perforated diverticulitis-6 cm abscess.  Upstream distention of large bowel-concerning for secondary large bowel obstruction. ?4/18>> CT abdomen/pelvis: New moderate free intraperitoneal air, severe ileus, dilated cecum with pneumatosis ? ?Significant microbiology data: ?None ? ?Procedures: ?None ? ?Consults: ?General surgery ? ?Subjective: ?Much better-Down to 3 L of oxygen. ? ?Objective: ?Vitals: ?Blood pressure 113/65, pulse 85, temperature 98 ?F (36.7 ?C), temperature source Oral, resp. rate 13, height 5\' 10"  (1.778 m), weight 82.7 kg, SpO2 93 %.  ? ?Exam: ?Gen Exam:Alert awake-not in any  distress ?HEENT:atraumatic, normocephalic ?Chest: B/L clear to auscultation anteriorly ?CVS:S1S2 regular ?Abdomen:soft-midline dressing in place-appropriately tender. ?Extremities:no edema ?Neurology: Non focal ?Skin: no rash  ? ?Pertinent Labs/Radiology: ? ?  Latest Ref Rng & Units 10/28/2021  ?  3:59 AM 10/27/2021  ?  4:05 AM 10/26/2021  ?  1:03 PM  ?CBC  ?WBC 4.0 - 10.5 K/uL 15.2   14.7   17.3    ?Hemoglobin 13.0 - 17.0 g/dL 9.9   10/28/2021   96.7    ?Hematocrit 39.0 - 52.0 % 30.5   33.7   37.5    ?Platelets 150 - 400 K/uL 568   629   663    ?  ?Lab Results  ?Component Value Date  ? NA 133 (L) 10/28/2021  ? K 3.5 10/28/2021  ? CL 103 10/28/2021  ? CO2 22 10/28/2021  ? ?  ? ? ?Assessment/Plan: ?Perforated diverticulitis-s/p exploratory laparotomy-right hemicolectomy/sigmoid colectomy with end ostomy on 4/18: Stable-General surgery following and directing care-remains on Zosyn/TNA per pharmacy. + liquid stool in ostomy bag. ? ?Acute hypoxic respiratory failure: Likely due to atelectasis/possible PNA-on Zosyn.  Was on 8 L of oxygen yesterday-has improved with aggressive pulmonary toileting/use of incentive spirometry-on 3 L of oxygen.  CTA chest negative for PE.  Continue mobilization-and other supportive care.  ? ?Hyponatremia: Mild-continue to follow. ? ?Hypokalemia: Continue to replete. ? ?DM-2 (A1c 6.0 on 4/14): Diet controlled at home-monitor with SSI.  ? ?Recent Labs  ?  10/28/21 ?10/30/21 10/28/21 ?10/30/21 10/28/21 ?1302  ?GLUCAP 153* 117* 148*  ? ?  ?  ?HTN: Had brief hypotension postoperatively-BP now stable-but continues to be off all antihypertensives for now.  Resume when able.   ? ?HLD: Resume  fenofibrate/statin  ? ?CAD s/p CABG approximately 10 years ago: No anginal symptoms-resume aspirin/statin ?  ?GERD: Continue PPI ?  ?Anxiety: Continue as needed Klonopin ?  ?Insomnia: Continue as needed temazepam ? ?OSA: CPAP nightly ? ?BMI: ?Estimated body mass index is 26.16 kg/m? as calculated from the following: ?  Height as  of this encounter: 5\' 10"  (1.778 m). ?  Weight as of this encounter: 82.7 kg.  ? ?Code status: ?  Code Status: Full Code  ? ?DVT Prophylaxis: ?enoxaparin (LOVENOX) injection 40 mg Start: 10/21/21 1200 ?SCDs Start: 10/20/21 1800 ?Start prophylactic Lovenox 4/14 ?  ?Family Communication: Spouse-[phone]-at bedside on 4/20. ? ? ?Disposition Plan: ?Status is: Inpatient ?Remains inpatient appropriate because: Perforated diverticulitis s/p laparotomy with colectomy and ostomy placement-not yet stable for discharge. ?Planned Discharge Destination:Home versus SNF ? ? ?Diet: ?Diet Order   ? ?       ?  Diet full liquid Room service appropriate? Yes; Fluid consistency: Thin  Diet effective now       ?  ? ?  ?  ? ?  ?  ? ? ?Antimicrobial agents: ?Anti-infectives (From admission, onward)  ? ? Start     Dose/Rate Route Frequency Ordered Stop  ? 10/20/21 2300  piperacillin-tazobactam (ZOSYN) IVPB 3.375 g       ? 3.375 g ?12.5 mL/hr over 240 Minutes Intravenous Every 8 hours 10/20/21 1854 10/30/21 2359  ? 10/20/21 1700  piperacillin-tazobactam (ZOSYN) IVPB 3.375 g       ? 3.375 g ?100 mL/hr over 30 Minutes Intravenous  Once 10/20/21 1656 10/20/21 1745  ? ?  ? ? ? ?MEDICATIONS: ?Scheduled Meds: ? acetaminophen  1,000 mg Oral Q6H  ? [START ON 10/29/2021] aspirin EC  81 mg Oral Daily  ? Chlorhexidine Gluconate Cloth  6 each Topical Daily  ? enoxaparin (LOVENOX) injection  40 mg Subcutaneous Q24H  ? feeding supplement  1 Container Oral TID BM  ? [START ON 10/29/2021] fenofibrate  160 mg Oral Daily  ? fluticasone  2 spray Each Nare QHS  ? insulin aspart  0-9 Units Subcutaneous Q6H  ? pantoprazole (PROTONIX) IV  40 mg Intravenous Q12H  ? [START ON 10/29/2021] rosuvastatin  10 mg Oral Daily  ? sodium chloride flush  10-40 mL Intracatheter Q12H  ? ?Continuous Infusions: ? methocarbamol (ROBAXIN) IV    ? piperacillin-tazobactam (ZOSYN)  IV 3.375 g (10/28/21 [id number])  ? potassium PHOSPHATE IVPB (in mmol)    ? TPN ADULT (ION) 50 mL/hr at  10/27/21 1821  ? TPN ADULT (ION)    ? ?PRN Meds:.albuterol, artificial tears, clonazePAM, HYDROmorphone (DILAUDID) injection, methocarbamol (ROBAXIN) IV, ondansetron **OR** ondansetron (ZOFRAN) IV, oxyCODONE, sodium chloride flush, temazepam ? ? ?I have personally reviewed following labs and imaging studies ? ?LABORATORY DATA: ?CBC: ?Recent Labs  ?Lab 10/22/21 ?[id number] 10/23/21 ?[id number] 10/26/21 ?0122 10/26/21 ?[id number] 10/26/21 ?1303 10/27/21 ?0405 10/28/21 ?0359  ?WBC 11.1*   < > 14.6* 15.2* 17.3* 14.7* 15.2*  ?NEUTROABS 8.8*  --   --   --   --   --   --   ?HGB 12.8*   < > 14.8 11.8* 12.7* 11.2* 9.9*  ?HCT 38.5*   < > 44.0 36.0* 37.5* 33.7* 30.5*  ?MCV 90.4   < > 90.5 91.4 90.4 91.3 91.6  ?PLT 507*   < > 848* 615* 663* 629* 568*  ? < > = values in this interval not displayed.  ? ? ? ?Basic Metabolic Panel: ?Recent Labs  ?Lab 10/24/21 ?0324 10/25/21 ?  2694 10/26/21 ?0122 10/27/21 ?0405 10/28/21 ?0359  ?NA 136 131* 131* 130* 133*  ?K 4.5 3.4* 3.8 3.0* 3.5  ?CL 107 102 103 102 103  ?CO2 24 19* 18* 22 22  ?GLUCOSE 119* 176* 160* 131* 166*  ?BUN 5* 5* 12 16 9   ?CREATININE 0.70 0.78 0.97 0.76 0.70  ?CALCIUM 9.0 8.4* 8.2* 7.9* 8.0*  ?MG 1.5* 2.4  --  2.0 1.8  ?PHOS  --   --   --  2.1* 1.6*  ? ? ? ?GFR: ?Estimated Creatinine Clearance: 81.1 mL/min (by C-G formula based on SCr of 0.7 mg/dL). ? ?Liver Function Tests: ?Recent Labs  ?Lab 10/27/21 ?0405  ?AST 27  ?ALT 16  ?ALKPHOS 42  ?BILITOT 0.7  ?PROT 4.6*  ?ALBUMIN 1.7*  ? ? ?No results for input(s): LIPASE, AMYLASE in the last 168 hours. ? ?No results for input(s): AMMONIA in the last 168 hours. ? ?Coagulation Profile: ?No results for input(s): INR, PROTIME in the last 168 hours. ? ?Cardiac Enzymes: ?No results for input(s): CKTOTAL, CKMB, CKMBINDEX, TROPONINI in the last 168 hours. ? ?BNP (last 3 results) ?No results for input(s): PROBNP in the last 8760 hours. ? ?Lipid Profile: ?Recent Labs  ?  10/27/21 ?0405  ?TRIG 180*  ? ? ? ?Thyroid Function Tests: ?No results for input(s):  TSH, T4TOTAL, FREET4, T3FREE, THYROIDAB in the last 72 hours. ? ?Anemia Panel: ?No results for input(s): VITAMINB12, FOLATE, FERRITIN, TIBC, IRON, RETICCTPCT in the last 72 hours. ? ?Urine analysis: ?   ?Component Value Date/T

## 2021-10-28 NOTE — Evaluation (Signed)
Clinical/Bedside Swallow Evaluation ?Patient Details  ?Name: Robert Barrett ?MRN: [id number] ?Date of Birth: 08-Jun-1946 ? ?Today's Date: 10/28/2021 ?Time: SLP Start Time (ACUTE ONLY): 0940 SLP Stop Time (ACUTE ONLY): 0947 ?SLP Time Calculation (min) (ACUTE ONLY): 7 min ? ?Past Medical History:  ?Past Medical History:  ?Diagnosis Date  ? Anxiety   ? CAD (coronary artery disease)   ? GERD (gastroesophageal reflux disease)   ? HLD (hyperlipidemia)   ? Hypertension   ? Insomnia   ? Sleep apnea   ? Type 2 diabetes mellitus (HCC)   ? ?Past Surgical History:  ?Past Surgical History:  ?Procedure Laterality Date  ? CARDIAC SURGERY    ? CORONARY ARTERY BYPASS GRAFT    ? LAPAROTOMY N/A 10/25/2021  ? Procedure: EXPLORATORY LAPAROTOMY, [facility] Procedure;  Surgeon: Berna Bue, MD;  Location: [facility] OR;  Service: General;  Laterality: N/A;  ? ?HPI:  ?76 y.o. male presented to the hospital 10/20/21 with abdominal pain. CT scan of his abdomen revealed perforated diverticulitis with a 6 cm abscess. 4/18 s/p Exploratory laparotomy, right hemicolectomy, sigmoid colectomy with end colostomy. Chest CT 4/20 concerning for "increased bilateral lower lobe consolidations, likely due to a  combination of atelectasis and aspiration."    PMH significant of DM-2, HTN, HLD, CAD s/p CABG repeat CT scan was performed on 4/18 right hemicolectomy, sigmoid colectomy and end ostomy placement"  ?  ?Assessment / Plan / Recommendation  ?Clinical Impression ? Pt presents with functional swallowing as assessed clincally.  Consistencies trialed were limited to thin liquid and puree 2/2 recent abdominal surgery with pt cleared to advance to full liquid diet this morning.  Pt tolerate puree and thin liquid by cup and straw with no clincal s/s of aspiration and exhibited good oral clearance.  Chest CT concerning for possible aspiration; messaged attending MD who is comfortable with clinical managment at this time base on pt presentation.  SLP will follow for  diet tolerance and will assess solid textures when pt is medically cleared to advance.   ? ?Recommend continuing full liquid diet at this time.   ? ?Of note, oral cavity is clear and clean appearing, but pt with brown black tongue.  Notified RN, recommend continued oral care.  Consider magic moutwash. ? ? ?SLP Visit Diagnosis: Dysphagia, unspecified (R13.10) ?   ?Aspiration Risk ? No limitations  ?  ?Diet Recommendation Thin liquid (Full liquid)  ? ?Liquid Administration via: Cup;Straw ?Medication Administration: Whole meds with liquid (no specific precautions) ?Compensations: Slow rate;Small sips/bites ?Postural Changes: Seated upright at 90 degrees  ?  ?Other  Recommendations Oral Care Recommendations: Oral care QID;Oral care before and after PO;Staff/trained caregiver to provide oral care   ? ?Recommendations for follow up therapy are one component of a multi-disciplinary discharge planning process, led by the attending physician.  Recommendations may be updated based on patient status, additional functional criteria and insurance authorization. ? ?Follow up Recommendations Follow physician's recommendations for discharge plan and follow up therapies (At present, pt would benefit from SLP follow up, but this may change if SLP can reassess with solid textures prior to d/c)  ? ? ?  ?Assistance Recommended at Discharge    ?Functional Status Assessment Patient has had a recent decline in their functional status and demonstrates the ability to make significant improvements in function in a reasonable and predictable amount of time.  ?Frequency and Duration min 2x/week  ?2 weeks ?  ?   ? ?Prognosis Prognosis for Safe Diet Advancement: Good  ? ?  ? ?  Swallow Study   ?General Date of Onset: 10/20/21 ?HPI: 76 y.o. male presented to the hospital 10/20/21 with abdominal pain. CT scan of his abdomen revealed perforated diverticulitis with a 6 cm abscess. 4/18 s/p Exploratory laparotomy, right hemicolectomy, sigmoid colectomy  with end colostomy. Chest CT 4/20 concerning for "increased bilateral lower lobe consolidations, likely due to a  combination of atelectasis and aspiration."    PMH significant of DM-2, HTN, HLD, CAD s/p CABG repeat CT scan was performed on 4/18 right hemicolectomy, sigmoid colectomy and end ostomy placement" ?Type of Study: Bedside Swallow Evaluation ?Previous Swallow Assessment: None ?Diet Prior to this Study: Thin liquids (Full liquids) ?History of Recent Intubation: No ?Behavior/Cognition: Alert;Cooperative;Pleasant mood ?Oral Cavity Assessment:  (Black tongue) ?Oral Care Completed by SLP: Recent completion by staff ?Oral Cavity - Dentition: Edentulous ?Patient Positioning: Partially reclined ?Baseline Vocal Quality: Normal ?Volitional Cough: Strong ?Volitional Swallow: Able to elicit  ?  ?Oral/Motor/Sensory Function Overall Oral Motor/Sensory Function: Within functional limits ?Facial ROM: Within Functional Limits ?Facial Symmetry: Within Functional Limits ?Lingual ROM: Within Functional Limits ?Lingual Symmetry: Within Functional Limits ?Lingual Strength: Within Functional Limits ?Velum: Within Functional Limits ?Mandible: Within Functional Limits   ?Ice Chips Ice chips: Not tested   ?Thin Liquid Thin Liquid: Within functional limits ?Presentation: Cup;Straw  ?  ?Nectar Thick Nectar Thick Liquid: Not tested   ?Honey Thick Honey Thick Liquid: Not tested   ?Puree Puree: Within functional limits ?Presentation: Spoon   ?Solid ? ? ?  Solid: Not tested  ? ?  ? ?Robert Barrett E Kimm Ungaro, MA, CCC-SLP ?Acute Rehabilitation Services ?Office: [phone] ?10/28/2021,10:14 AM ? ? ? ?

## 2021-10-28 NOTE — Progress Notes (Incomplete)
2240: Pt removed his oxygen form his nostril in his sleep. SpO2 in high 80s. Reapplied with good recovery.  ? ?

## 2021-10-28 NOTE — Progress Notes (Signed)
3 Days Post-Op  ? ?Subjective/Chief Complaint: ?Doing better, hungry, no pe on scan ? ? ?Objective: ?Vital signs in last 24 hours: ?Temp:  [97.5 ?F (36.4 ?C)-98.4 ?F (36.9 ?C)] 98.1 ?F (36.7 ?C) (04/21 0751) ?Pulse Rate:  [76-98] 86 (04/21 0751) ?Resp:  [13-19] 19 (04/21 0751) ?BP: (95-116)/(57-71) 114/62 (04/21 0751) ?SpO2:  [86 %-96 %] 90 % (04/21 0751) ?Last BM Date : 10/27/21 ? ?Intake/Output from previous day: ?04/20 0701 - 04/21 0700 ?In: 1790.1 [I.V.:840.1; IV Piggyback:950] ?Out: 3010 [Urine:2500; Drains:285; Stool:225] ?Intake/Output this shift: ?No intake/output data recorded. ? ?GI: soft approp tender wound clean stool in bag, jp serous ?GU yellow urine ? ? ? ?Lab Results:  ?Recent Labs  ?  10/27/21 ?0405 10/28/21 ?0359  ?WBC 14.7* 15.2*  ?HGB 11.2* 9.9*  ?HCT 33.7* 30.5*  ?PLT 629* 568*  ? ?BMET ?Recent Labs  ?  10/26/21 ?0122 10/27/21 ?0405  ?NA 131* 130*  ?K 3.8 3.0*  ?CL 103 102  ?CO2 18* 22  ?GLUCOSE 160* 131*  ?BUN 12 16  ?CREATININE 0.97 0.76  ?CALCIUM 8.2* 7.9*  ? ?PT/INR ?No results for input(s): LABPROT, INR in the last 72 hours. ?ABG ?No results for input(s): PHART, HCO3 in the last 72 hours. ? ?Invalid input(s): PCO2, PO2 ? ?Studies/Results: ?CT Angio Chest Pulmonary Embolism (PE) W or WO Contrast ? ?Result Date: 10/27/2021 ?CLINICAL DATA:  Evaluate pulmonary embolus; right hemicolectomy 4/19 EXAM: CT ANGIOGRAPHY CHEST WITH CONTRAST TECHNIQUE: Multidetector CT imaging of the chest was performed using the standard protocol during bolus administration of intravenous contrast. Multiplanar CT image reconstructions and MIPs were obtained to evaluate the vascular anatomy. RADIATION DOSE REDUCTION: This exam was performed according to the departmental dose-optimization program which includes automated exposure control, adjustment of the mA and/or kV according to patient size and/or use of iterative reconstruction technique. CONTRAST:  OMNIPAQUE IOHEXOL 350 MG/ML SOLN COMPARISON:  CT abdomen and  pelvis dated October 25, 2021 FINDINGS: Cardiovascular: Normal heart size. No pericardial effusion. Three-vessel coronary artery calcifications status post CABG. Lima graft to the LAD and vein graft to one of the diagonal branches, grafts appear patent. Moderate atherosclerotic disease of the thoracic aorta. Satisfactory opacification of the pulmonary arteries. No evidence of pulmonary embolus. Evaluation of the bilateral lower lobe segmental pulmonary arteries is limited due to airspace opacities. Mediastinum/Nodes: Dilated and fluid-filled esophagus. Thyroid is unremarkable. No pathologically enlarged lymph nodes seen in the chest. Lungs/Pleura: Central airways are patent. Bilateral lower lobe consolidations, increased when compared with prior CT. Small bilateral pleural effusions. Upper Abdomen: Partially visualized upper abdomen demonstrates abdominal ascites and free intraperitoneal air. Fluid-filled stomach with increased dilation when compared with prior exam. Musculoskeletal: No chest wall abnormality. No acute or significant osseous findings. Review of the MIP images confirms the above findings. IMPRESSION: 1.  No evidence of pulmonary embolus. 2. Increased bilateral lower lobe consolidations, likely due to a combination of atelectasis and aspiration. 3. Small bilateral pleural effusions, similar prior exam. 4. Fluid-filled and dilated esophagus and stomach, increased when compared with prior CT, concerning ileus. 5. Abdominal ascites and free intraperitoneal air of the partially visualized upper abdomen, likely postsurgical. 6.  Aortic Atherosclerosis (ICD10-I70.0). Electronically Signed   By: Allegra Lai M.D.   On: 10/27/2021 15:55  ? ?DG CHEST PORT 1 VIEW ? ?Result Date: 10/27/2021 ?CLINICAL DATA:  Short of breath EXAM: PORTABLE CHEST 1 VIEW COMPARISON:  CT 10/25/2021 FINDINGS: Sternotomy wires overlie normal cardiac silhouette. RIGHT lobe atelectasis similar to comparison CT. Low lung  volumes. No  pneumothorax. PICC line with tip in the distal SVC IMPRESSION: 1. RIGHT basilar atelectasis. 2. Low lung volumes. Electronically Signed   By: Genevive Bi M.D.   On: 10/27/2021 08:12   ? ?Anti-infectives: ?Anti-infectives (From admission, onward)  ? ? Start     Dose/Rate Route Frequency Ordered Stop  ? 10/20/21 2300  piperacillin-tazobactam (ZOSYN) IVPB 3.375 g       ? 3.375 g ?12.5 mL/hr over 240 Minutes Intravenous Every 8 hours 10/20/21 1854 10/30/21 2359  ? 10/20/21 1700  piperacillin-tazobactam (ZOSYN) IVPB 3.375 g       ? 3.375 g ?100 mL/hr over 30 Minutes Intravenous  Once 10/20/21 1656 10/20/21 1745  ? ?  ? ? ?Assessment/Plan: ?-POD#3 s/p Exploratory laparotomy, right hemicolectomy, sigmoid colectomy with end colostomy 4/18 Dr. Fredricka Bonine ?- intraop findings: Large bowel obstruction with perforated cecum and diffuse feculent peritonitis, sigmoid diverticulitis with dense inflammatory adhesions to the bladder as well as associated localized abscess ?- path diverticulitis sigmoid with abscess, necrosis of cecum- all benign ?- continue drain, currently serosanguinous ?- continue IV antibiotics for 5 days postop ?- TID wet to dry dressing changes to midline abdominal wound ?- WOC consult for colostomy ?- will do fulls but continue TPN until sure he is tolerating due to prolonged npo ? -pulm status much improved, needs pulm toilet and oob, PT ?  ?ID - zosyn 4/13>> ?FEN - IVF, TPN, fulls ?VTE - lovenox ?Foley - dc today, clear and much better overall ?  ?ABL anemia ?T2DM ?HTN ?HLD ?CAD s/p CABG ?GERD ?Anxiety ?OSA ? ? ?Emelia Loron ?10/28/2021 ? ?

## 2021-10-28 NOTE — Consult Note (Signed)
WOC Nurse ostomy follow up ?Patient receiving care in [facility] 5W20 ?Stoma type/location: LMQ colostomy ?Stomal assessment/size: 1 5/8" oval, retracted in a crease ?Peristomal assessment: intact ?Treatment options for stomal/peristomal skin: skin barrier ring ?Output: thin brown 50 cc emptied from pouch ?Ostomy pouching: 2pc. 2 1/4 pouch [place] # [phone]) Skin barrier [place] # [phone]) Barrier ring [place] # [phone]) ?Education provided: Wife present today for teaching and education. Education materials left with the wife.  ?Explained role of ostomy nurse and creation of stoma  ?Explained stoma characteristics (budded, flush, color, texture, care) ?Demonstrated pouch change (cutting new skin barrier, measuring stoma, cleaning peristomal skin and stoma, use of barrier ring) ?Education on emptying when 1/3 to 1/2 full and how to empty ?Demonstrated "burping" flatus from pouch ?Demonstrated use of wick to clean spout  ?Discussed treatment of peristomal skin (ostomy powder, skin barrier wipes) ?Answered patient/family questions:   ?Handout given "Eating with an Ostomy" ([employer] [place] www.ostomy.org (2022)    ?   ?Enrolled patient in [place] Secure Start Discharge program: Yes ?  ?I recommend/discussed the [facility] outpatient ostomy clinic as an outpatient resource.  IF MD agrees this would be beneficial, please fax referral, or enter electronically in Epic the referral. (Fax- [phone])  ?  ?WOC will follow for teaching and education until discharge. ?  ?Renaldo Reel. Katrinka Blazing, MSN, RN, CMSRN, AGCNS, WTA ?Wound Treatment Associate ?Pager [phone]  ?

## 2021-10-28 NOTE — Progress Notes (Signed)
PHARMACY - TOTAL PARENTERAL NUTRITION CONSULT NOTE  ? ?Indication: Large bowel obstruction with perforated cecum, NPO > 7 days ? ?Patient Measurements: ?Height: _0  (177.8 cm) ?Weight: 82.7 kg (182 lb 5.1 oz) ?IBW/kg (Calculated) : 73 ? Body mass index is 26.16 kg/m?. ?Usual Weight: 95 kg ? ?Assessment: [name] with past medical history of T2DM, HTN, HLD, CAD s/p CABG who presented on 4/13 to the emergency department with colicky abdominal pain and nausea. CT from 4/13 showed perforated diverticulitis with a 6 mm abscess at which time he was made NPO. IR was unable to drain abscess, however, pt began improving on Zosyn. Pt was given a CLD on 4/15-4/18. Repeat CT on 4/18 showed new moderate free intraperitoneal air c/w sigmoid colon perforation, moderate-severe sigmoid diverticulitis, severe ileus w/ dilated cecum demonstrating penumatosis. These findings lead to emergent surgery on 4/18 - ex-lap  R hemicolectomy, sigmoid colectomy with end colostomy creation. ? ?Prior to admission, patient reports a very poor appetite over the past 6 months. He has noted a gradual weight loss over that time to 197 lb prior to admission. He reports only being able to tolerate a few juices while on the CLD since admission. Pharmacy consulted to initiate and manage TPN on 4/19. Full liquid diet started on 10/27/21.  ? ?Glucose / Insulin: BG < 180, sSSI (7u given in last 24h) ?Electrolytes: Na 133, K 3.5, coCa 9.8, Phos down to 1.6, Mag 1.8, others wnl ?Renal: SCr 0.7, BUN wnl ?Hepatic: LFTs/ Alk Phos/ T.bili wnl, TG 180, Albumin 1.7 ?Intake / Output; MIVF: RLQ pelvic drain output: 85 mL, UOP: 11105m (documentation started post-op) ?GI Imaging: None since surgery ?GI Surgeries / Procedures:  ?4/18 - ex-lap  R hemicolectomy, sigmoid colectomy with end colostomy creation ? ?Central access: double-lumen PICC placed 10/26/21 ?TPN start date: 10/26/21 ? ?Nutritional Goals: ?Goal TPN rate is 90 mL/hr (provides 110 g of protein and 2160 kcals  per day) ? ?RD Assessment: ?Estimated Needs ?Total Energy Estimated Needs: 2000-2200 kcal/d ?Total Protein Estimated Needs: 100-110 g/d ?Total Fluid Estimated Needs: 2-2.2 L/d ? ?Current Nutrition:  ?Full liquids and TPN ? ?Plan:  ?Continue TPN at 591mhr (55% of goal rate, providing 61.2g of protein and 1200 kcal/d). Unable to increase TPN further due to signs of refeeding syndrome with electrolyte abnormalities  ?Electrolytes in TPN: Change: Na to 150 mEq/L, Mg to 1067mL. Continue: K 75m95m (max in TPN), Ca 6mEq60m and Phos 25mmo43m(max in TPN), Cl:Ac 1:2 ?Supplement potassium phosphate 25mmol53mx1 ?Add standard MVI and trace elements to TPN ?Add thiamine to TPN due to refeeding syndrome x 5 days (started 4/21) ?Initiate Sensitive q6h SSI and adjust as needed  ?F/u tolerance of full liquid diet and ability to wean/d/c TPN ?Monitor TPN labs daily until stable, then on Mon/Thurs. ? ?Donaldson Richter ASherlon HandingD, BCPS ?Please see amion for complete clinical pharmacist phone list ?10/28/2021,9:50 AM ? ?

## 2021-10-29 ENCOUNTER — Inpatient Hospital Stay (HOSPITAL_COMMUNITY): Payer: No Typology Code available for payment source

## 2021-10-29 DIAGNOSIS — K5720 Diverticulitis of large intestine with perforation and abscess without bleeding: Secondary | ICD-10-CM | POA: Diagnosis not present

## 2021-10-29 LAB — BASIC METABOLIC PANEL
Anion gap: 6 (ref 5–15)
BUN: 6 mg/dL — ABNORMAL LOW (ref 8–23)
CO2: 26 mmol/L (ref 22–32)
Calcium: 7.9 mg/dL — ABNORMAL LOW (ref 8.9–10.3)
Chloride: 102 mmol/L (ref 98–111)
Creatinine, Ser: 0.65 mg/dL (ref 0.61–1.24)
GFR, Estimated: >60 mL/min (ref >60)
Glucose, Bld: 148 mg/dL — ABNORMAL HIGH (ref 70–99)
Potassium: 3.6 mmol/L (ref 3.5–5.1)
Sodium: 134 mmol/L — ABNORMAL LOW (ref 135–145)

## 2021-10-29 LAB — CBC
HCT: 31.7 % — ABNORMAL LOW (ref 39.0–52.0)
Hemoglobin: 10.5 g/dL — ABNORMAL LOW (ref 13.0–17.0)
MCH: 29.8 pg (ref 26.0–34.0)
MCHC: 33.1 g/dL (ref 30.0–36.0)
MCV: 90.1 fL (ref 80.0–100.0)
Platelets: 598 10*3/uL — ABNORMAL HIGH (ref 150–400)
RBC: 3.52 MIL/uL — ABNORMAL LOW (ref 4.22–5.81)
RDW: 14.4 % (ref 11.5–15.5)
WBC: 17.6 10*3/uL — ABNORMAL HIGH (ref 4.0–10.5)
nRBC: 0.2 % (ref 0.0–0.2)

## 2021-10-29 LAB — GLUCOSE, CAPILLARY
Glucose-Capillary: 142 mg/dL — ABNORMAL HIGH (ref 70–99)
Glucose-Capillary: 151 mg/dL — ABNORMAL HIGH (ref 70–99)
Glucose-Capillary: 160 mg/dL — ABNORMAL HIGH (ref 70–99)
Glucose-Capillary: 196 mg/dL — ABNORMAL HIGH (ref 70–99)
Glucose-Capillary: 210 mg/dL — ABNORMAL HIGH (ref 70–99)

## 2021-10-29 LAB — PHOSPHORUS: Phosphorus: 2.4 mg/dL — ABNORMAL LOW (ref 2.5–4.6)

## 2021-10-29 LAB — BRAIN NATRIURETIC PEPTIDE: B Natriuretic Peptide: 447 pg/mL — ABNORMAL HIGH (ref 0.0–100.0)

## 2021-10-29 LAB — MAGNESIUM: Magnesium: 1.9 mg/dL (ref 1.7–2.4)

## 2021-10-29 MED ORDER — SODIUM PHOSPHATES 45 MMOLE/15ML IV SOLN
30.0000 mmol | Freq: Once | INTRAVENOUS | Status: AC
  Administered 2021-10-29: 30 mmol via INTRAVENOUS
  Filled 2021-10-29 (×2): qty 10

## 2021-10-29 MED ORDER — FUROSEMIDE 10 MG/ML IJ SOLN
20.0000 mg | Freq: Once | INTRAMUSCULAR | Status: AC
  Administered 2021-10-29: 20 mg via INTRAVENOUS
  Filled 2021-10-29: qty 2

## 2021-10-29 MED ORDER — TRAVASOL 10 % IV SOLN
INTRAVENOUS | Status: DC
  Filled 2021-10-29: qty 1101.6

## 2021-10-29 MED ORDER — PANTOPRAZOLE SODIUM 40 MG PO TBEC
40.0000 mg | DELAYED_RELEASE_TABLET | Freq: Two times a day (BID) | ORAL | Status: DC
  Administered 2021-10-29 – 2021-11-04 (×12): 40 mg via ORAL
  Filled 2021-10-29 (×12): qty 1

## 2021-10-29 MED ORDER — POTASSIUM CHLORIDE 10 MEQ/50ML IV SOLN
10.0000 meq | INTRAVENOUS | Status: AC
  Administered 2021-10-29 (×3): 10 meq via INTRAVENOUS
  Filled 2021-10-29 (×3): qty 50

## 2021-10-29 NOTE — Progress Notes (Signed)
?      ?                 PROGRESS NOTE ? ?      ?PATIENT DETAILS ?Name: Robert Barrett ?Age: 76 y.o. ?Sex: male ?Date of Birth: [birth year] ?Admit Date: 10/20/2021 ?Admitting Physician Maretta Bees, MD ?[id number], [name] is a 76 y.o.  male with past medical history of DM-2, HTN, HLD, CAD s/p CABG-presented with abdominal pain-found to have perforated diverticulitis with abscess.  Patient was admitted to the hospitalist service-started on Zosyn and other supportive care-General surgery followed closely, repeat CT scan was performed on 4/18 which showed worsening changes with moderate free intraperitoneal air, pneumatosis.  Patient was subsequently taken to the operating room by general surgery where he underwent right hemicolectomy, sigmoid colectomy and end ostomy placement.  See below for further details. ? ?Significant events: ?4/13>> admit to [facility] for perforated diverticulitis with 6 cm abscess. ?4/14>> per IR-no appropriate window to place drain. ?4/18>> repeat CT abdomen worsening free air/pneumatosis-surgery performed right hemicolectomy/sigmoid colectomy and end ostomy placement. ?4/19>> mild hypotension that responded to IVF. ?4/20>> developed hypoxemia requiring around 8 L of O2.  CTA chest negative for PE. ? ?Significant studies: ?4/13>> CT abdomen/pelvis: Perforated diverticulitis-6 cm abscess.  Upstream distention of large bowel-concerning for secondary large bowel obstruction. ?4/18>> CT abdomen/pelvis: New moderate free intraperitoneal air, severe ileus, dilated cecum with pneumatosis ? ?Significant microbiology data: ?None ? ?Procedures: ?None ? ?Consults: ?General surgery ? ?Subjective: ? ?Patient in bed, appears comfortable, denies any headache, no fever, no chest pain or pressure, no shortness of breath , no abdominal pain. No focal weakness. ? ?Objective: ?Vitals: ?Blood pressure 126/77, pulse 83, temperature 97.9 ?F (36.6 ?C), temperature source Oral, resp.  rate 18, height 5\' 10"  (1.778 m), weight 82.7 kg, SpO2 93 %.  ? ?Exam: ? ?Awake Alert, No new F.N deficits, Normal affect ?[place].AT,PERRAL ?Supple Neck, No JVD,   ?Symmetrical Chest wall movement, Good air movement bilaterally, CTAB ?RRR,No Gallops, Rubs or new Murmurs,  ?+ve B.Sounds, Abd Soft, abdominal dressing in place with JP drain, colostomy bag has liquid stool. ?No Cyanosis, Clubbing or edema  ? ? ?Assessment/Plan: ? ?Perforated diverticulitis-s/p exploratory laparotomy-right hemicolectomy/sigmoid colectomy with end ostomy on 4/18: Stable-General surgery following and directing care-remains on Zosyn/TNA per pharmacy. + liquid stool in ostomy bag.  On full liquids by mouth per general surgery will do calorie count if appropriate will try and stop TNA in the next few days. ? ?Acute hypoxic respiratory failure: Likely due to atelectasis/possible PNA-on Zosyn.  Was on 8 L of oxygen yesterday-has improved with aggressive pulmonary toileting/use of incentive spirometry-on 3 L of oxygen.  CTA chest negative for PE.  Continue mobilization-and other supportive care.  ? ?HTN: Had brief hypotension postoperatively-BP now stable-but continues to be off all antihypertensives for now.  Resume when able.   ? ?HLD: Resume fenofibrate/statin  ? ?CAD s/p CABG approximately 10 years ago: No anginal symptoms-resume aspirin/statin ?  ?GERD: Continue PPI ?  ?Anxiety: Continue as needed Klonopin ?  ?Insomnia: Continue as needed temazepam ? ?Hypophosphatemia.  Replaced.   ? ?OSA: CPAP nightly, noncompliant strictly counseled.  Requested to get home CPAP machine on 10/29/2021 ? ?DM-2 (A1c 6.0 on 4/14): Diet controlled at home-monitor with SSI.  ? ?Recent Labs  ?  10/28/21 ?2321 10/29/21 ?10/31/21 10/29/21 ?0803  ?GLUCAP 129* 142* 151*  ?  ?  ? ? ?BMI: ?Estimated body mass index is 26.16 kg/m?  as calculated from the following: ?  Height as of this encounter: 5\' 10"  (1.778 m). ?  Weight as of this encounter: 82.7 kg.  ? ?Code status: ?  Code  Status: Full Code  ? ?DVT Prophylaxis: ?enoxaparin (LOVENOX) injection 40 mg Start: 10/21/21 1200 ?SCDs Start: 10/20/21 1800 ?Start prophylactic Lovenox 4/14 ?  ?Family Communication: Spouse-Pamela-[phone]-at bedside on 4/20. ? ? ?Disposition Plan: ?Status is: Inpatient ?Remains inpatient appropriate because: Perforated diverticulitis s/p laparotomy with colectomy and ostomy placement-not yet stable for discharge. ?Planned Discharge Destination:Home versus SNF ? ? ?Diet: ?Diet Order   ? ?       ?  Diet full liquid Room service appropriate? Yes; Fluid consistency: Thin  Diet effective now       ?  ? ?  ?  ? ?  ?  ? ? ?Antimicrobial agents: ?Anti-infectives (From admission, onward)  ? ? Start     Dose/Rate Route Frequency Ordered Stop  ? 10/20/21 2300  piperacillin-tazobactam (ZOSYN) IVPB 3.375 g       ? 3.375 g ?12.5 mL/hr over 240 Minutes Intravenous Every 8 hours 10/20/21 1854 10/30/21 2359  ? 10/20/21 1700  piperacillin-tazobactam (ZOSYN) IVPB 3.375 g       ? 3.375 g ?100 mL/hr over 30 Minutes Intravenous  Once 10/20/21 1656 10/20/21 1745  ? ?  ? ? ? ?MEDICATIONS: ?Scheduled Meds: ? acetaminophen  1,000 mg Oral Q6H  ? aspirin EC  81 mg Oral Daily  ? Chlorhexidine Gluconate Cloth  6 each Topical Daily  ? enoxaparin (LOVENOX) injection  40 mg Subcutaneous Q24H  ? feeding supplement  1 Container Oral TID BM  ? fenofibrate  160 mg Oral Daily  ? fluticasone  2 spray Each Nare QHS  ? insulin aspart  0-9 Units Subcutaneous Q6H  ? pantoprazole  40 mg Oral BID  ? rosuvastatin  10 mg Oral Daily  ? sodium chloride flush  10-40 mL Intracatheter Q12H  ? ?Continuous Infusions: ? sodium chloride 10 mL/hr at 10/28/21 2107  ? methocarbamol (ROBAXIN) IV    ? piperacillin-tazobactam (ZOSYN)  IV 3.375 g (10/28/21 2331)  ? sodium phosphate  Dextrose 5% IVPB    ? TPN ADULT (ION) 50 mL/hr at 10/28/21 1732  ? ?PRN Meds:.sodium chloride, albuterol, artificial tears, clonazePAM, HYDROmorphone (DILAUDID) injection, methocarbamol  (ROBAXIN) IV, ondansetron **OR** ondansetron (ZOFRAN) IV, oxyCODONE, sodium chloride flush, temazepam ? ? ?I have personally reviewed following labs and imaging studies ? ?LABORATORY DATA: ? ?Recent Labs  ?Lab 10/26/21 ?0122 10/26/21 ?0750 10/26/21 ?1303 10/27/21 ?0405 10/28/21 ?0359  ?WBC 14.6* 15.2* 17.3* 14.7* 15.2*  ?HGB 14.8 11.8* 12.7* 11.2* 9.9*  ?HCT 44.0 36.0* 37.5* 33.7* 30.5*  ?PLT 848* 615* 663* 629* 568*  ?MCV 90.5 91.4 90.4 91.3 91.6  ?MCH 30.5 29.9 30.6 30.4 29.7  ?MCHC 33.6 32.8 33.9 33.2 32.5  ?RDW 13.9 13.9 13.9 13.9 14.0  ? ? ?Recent Labs  ?Lab 10/24/21 ?0324 10/25/21 ?0146 10/26/21 ?0122 10/27/21 ?0405 10/28/21 ?10/30/21 10/29/21 ?0422  ?NA 136 131* 131* 130* 133* 134*  ?K 4.5 3.4* 3.8 3.0* 3.5 3.6  ?CL 107 102 103 102 103 102  ?CO2 24 19* 18* 22 22 26   ?GLUCOSE 119* 176* 160* 131* 166* 148*  ?BUN 5* 5* 12 16 9  6*  ?CREATININE 0.70 0.78 0.97 0.76 0.70 0.65  ?CALCIUM 9.0 8.4* 8.2* 7.9* 8.0* 7.9*  ?AST  --   --   --  27  --   --   ?ALT  --   --   --  16  --   --   ?ALKPHOS  --   --   --  4142  --   --   ?BILITOT  --   --   --  0.7  --   --   ?ALBUMIN  --   --   --  1.7*  --   --   ?MG 1.5* 2.4  --  2.0 1.8 1.9  ?BNP  --   --   --  144.4*  --   --   ? ? ?  ?  ? ?Urine analysis: ?   ?Component Value Date/Time  ? COLORURINE YELLOW 10/20/2021 0855  ? APPEARANCEUR HAZY (A) 10/20/2021 0855  ? LABSPEC 1.012 10/20/2021 0855  ? PHURINE 6.0 10/20/2021 0855  ? GLUCOSEU NEGATIVE 10/20/2021 0855  ? HGBUR NEGATIVE 10/20/2021 0855  ? BILIRUBINUR NEGATIVE 10/20/2021 0855  ? KETONESUR NEGATIVE 10/20/2021 0855  ? PROTEINUR NEGATIVE 10/20/2021 0855  ? UROBILINOGEN 1.0 04/11/2007 1627  ? NITRITE NEGATIVE 10/20/2021 0855  ? LEUKOCYTESUR NEGATIVE 10/20/2021 0855  ? ? ?Sepsis Labs: ?Lactic Acid, Venous ?No results found for: LATICACIDVEN ? ?MICROBIOLOGY: ?No results found for this or any previous visit (from the past 240 hour(s)). ? ?RADIOLOGY STUDIES/RESULTS: ?CT Angio Chest Pulmonary Embolism (PE) W or WO Contrast ? ?Result  Date: 10/27/2021 ?CLINICAL DATA:  Evaluate pulmonary embolus; right hemicolectomy 4/19 EXAM: CT ANGIOGRAPHY CHEST WITH CONTRAST TECHNIQUE: Multidetector CT imaging of the chest was performed using the standard

## 2021-10-29 NOTE — Progress Notes (Signed)
4 Days Post-Op  ? ?Subjective/Chief Complaint: ?No complaints ? ? ?Objective: ?Vital signs in last 24 hours: ?Temp:  [97.8 ?F (36.6 ?C)-98 ?F (36.7 ?C)] 97.9 ?F (36.6 ?C) (04/22 8616) ?Pulse Rate:  [81-96] 83 (04/22 0804) ?Resp:  [15-21] 18 (04/22 0804) ?BP: (109-137)/(61-78) 126/77 (04/22 0804) ?SpO2:  [90 %-93 %] 93 % (04/22 0804) ?Last BM Date : 10/28/21 ? ?Intake/Output from previous day: ?04/21 0701 - 04/22 0700 ?In: 2832.7 [P.O.:1080; I.V.:1232.5; IV Piggyback:520.3] ?Out: 2310 [Urine:1400; Drains:360; Stool:550] ?Intake/Output this shift: ?Total I/O ?In: -  ?Out: 250 [Urine:250] ? ?GI: soft approp tender wound clean stool in bag, jp serous ?GU yellow urine ? ? ? ?Lab Results:  ?Recent Labs  ?  10/27/21 ?0405 10/28/21 ?0359  ?WBC 14.7* 15.2*  ?HGB 11.2* 9.9*  ?HCT 33.7* 30.5*  ?PLT 629* 568*  ? ? ?BMET ?Recent Labs  ?  10/28/21 ?0359 10/29/21 ?0422  ?NA 133* 134*  ?K 3.5 3.6  ?CL 103 102  ?CO2 22 26  ?GLUCOSE 166* 148*  ?BUN 9 6*  ?CREATININE 0.70 0.65  ?CALCIUM 8.0* 7.9*  ? ? ?PT/INR ?No results for input(s): LABPROT, INR in the last 72 hours. ?ABG ?No results for input(s): PHART, HCO3 in the last 72 hours. ? ?Invalid input(s): PCO2, PO2 ? ?Studies/Results: ?CT Angio Chest Pulmonary Embolism (PE) W or WO Contrast ? ?Result Date: 10/27/2021 ?CLINICAL DATA:  Evaluate pulmonary embolus; right hemicolectomy 4/19 EXAM: CT ANGIOGRAPHY CHEST WITH CONTRAST TECHNIQUE: Multidetector CT imaging of the chest was performed using the standard protocol during bolus administration of intravenous contrast. Multiplanar CT image reconstructions and MIPs were obtained to evaluate the vascular anatomy. RADIATION DOSE REDUCTION: This exam was performed according to the departmental dose-optimization program which includes automated exposure control, adjustment of the mA and/or kV according to patient size and/or use of iterative reconstruction technique. CONTRAST:  OMNIPAQUE IOHEXOL 350 MG/ML SOLN COMPARISON:  CT abdomen and  pelvis dated October 25, 2021 FINDINGS: Cardiovascular: Normal heart size. No pericardial effusion. Three-vessel coronary artery calcifications status post CABG. Lima graft to the LAD and vein graft to one of the diagonal branches, grafts appear patent. Moderate atherosclerotic disease of the thoracic aorta. Satisfactory opacification of the pulmonary arteries. No evidence of pulmonary embolus. Evaluation of the bilateral lower lobe segmental pulmonary arteries is limited due to airspace opacities. Mediastinum/Nodes: Dilated and fluid-filled esophagus. Thyroid is unremarkable. No pathologically enlarged lymph nodes seen in the chest. Lungs/Pleura: Central airways are patent. Bilateral lower lobe consolidations, increased when compared with prior CT. Small bilateral pleural effusions. Upper Abdomen: Partially visualized upper abdomen demonstrates abdominal ascites and free intraperitoneal air. Fluid-filled stomach with increased dilation when compared with prior exam. Musculoskeletal: No chest wall abnormality. No acute or significant osseous findings. Review of the MIP images confirms the above findings. IMPRESSION: 1.  No evidence of pulmonary embolus. 2. Increased bilateral lower lobe consolidations, likely due to a combination of atelectasis and aspiration. 3. Small bilateral pleural effusions, similar prior exam. 4. Fluid-filled and dilated esophagus and stomach, increased when compared with prior CT, concerning ileus. 5. Abdominal ascites and free intraperitoneal air of the partially visualized upper abdomen, likely postsurgical. 6.  Aortic Atherosclerosis (ICD10-I70.0). Electronically Signed   By: Allegra Lai M.D.   On: 10/27/2021 15:55  ? ?DG Chest Port 1 View ? ?Result Date: 10/29/2021 ?CLINICAL DATA:  Abdominal pain EXAM: PORTABLE CHEST 1 VIEW COMPARISON:  Two days ago FINDINGS: Low volume chest with atelectatic type density at the bases, also seen on  prior. No visible pneumoperitoneum. Prior CABG with  stable cardiomediastinal contours. Right PICC with tip at the SVC. Artifact from EKG leads. IMPRESSION: Low volume chest with atelectasis at the bases, similar to 2 days prior. Electronically Signed   By: Tiburcio Pea M.D.   On: 10/29/2021 09:03   ? ?Anti-infectives: ?Anti-infectives (From admission, onward)  ? ? Start     Dose/Rate Route Frequency Ordered Stop  ? 10/20/21 2300  piperacillin-tazobactam (ZOSYN) IVPB 3.375 g       ? 3.375 g ?12.5 mL/hr over 240 Minutes Intravenous Every 8 hours 10/20/21 1854 10/30/21 2359  ? 10/20/21 1700  piperacillin-tazobactam (ZOSYN) IVPB 3.375 g       ? 3.375 g ?100 mL/hr over 30 Minutes Intravenous  Once 10/20/21 1656 10/20/21 1745  ? ?  ? ? ?Assessment/Plan: ?-POD#4 s/p Exploratory laparotomy, right hemicolectomy, sigmoid colectomy with end colostomy 4/18 Dr. Fredricka Bonine ?- intraop findings: Large bowel obstruction with perforated cecum and diffuse feculent peritonitis, sigmoid diverticulitis with dense inflammatory adhesions to the bladder as well as associated localized abscess ?- path diverticulitis sigmoid with abscess, necrosis of cecum- all benign ?- continue drain, currently serosanguinous ?- continue IV antibiotics for 5 days postop ?- TID wet to dry dressing changes to midline abdominal wound ?- WOC consult for colostomy ?- will do fulls but continue TPN until sure he is tolerating due to prolonged npo ? -pulm status much improved, needs pulm toilet and oob, PT ?  ?ID - zosyn 4/13>> ?FEN - IVF, TPN, fulls ?VTE - lovenox ?Foley - dc 4/21, clear and much better overall ?  ?ABL anemia ?T2DM ?HTN ?HLD ?CAD s/p CABG ?GERD ?Anxiety ?OSA ? ? ?Robert Barrett Lollie Sails ?10/29/2021 ? ?

## 2021-10-29 NOTE — Progress Notes (Addendum)
PHARMACY - TOTAL PARENTERAL NUTRITION CONSULT NOTE  ? ?Indication: Large bowel obstruction with perforated cecum, NPO > 7 days ? ?Patient Measurements: ?Height: '5\' 10"'  (177.8 cm) ?Weight: 82.7 kg (182 lb 5.1 oz) ?IBW/kg (Calculated) : 73 ? Body mass index is 26.16 kg/m?. ?Usual Weight: 95 kg ? ?Assessment: [name] with past medical history of T2DM, HTN, HLD, CAD s/p CABG who presented on 4/13 to the emergency department with colicky abdominal pain and nausea. CT from 4/13 showed perforated diverticulitis with a 6 mm abscess at which time he was made NPO. IR was unable to drain abscess, however, pt began improving on Zosyn. Pt was given a CLD on 4/15-4/18. Repeat CT on 4/18 showed new moderate free intraperitoneal air c/w sigmoid colon perforation, moderate-severe sigmoid diverticulitis, severe ileus w/ dilated cecum demonstrating penumatosis. These findings lead to emergent surgery on 4/18 - ex-lap  R hemicolectomy, sigmoid colectomy with end colostomy creation. ? ?Prior to admission, patient reports a very poor appetite over the past 6 months. He has noted a gradual weight loss over that time to 197 lb prior to admission. He reports only being able to tolerate a few juices while on the CLD since admission. Pharmacy consulted to initiate and manage TPN on 4/19. Full liquid diet started on 10/27/21.  ? ?Glucose / Insulin: BG < 180, sSSI (5 units given in last 24h) ?Electrolytes: Na 134, K 3.6, coCa 9.7, Phos 2.4 (MD replaced with NaPhos 73mol) (max in TPN), Mag 1.9, others wnl ?Renal: SCr <1, BUN wnl ?Hepatic: LFTs/ Alk Phos/ T.bili wnl, TG 180, Albumin 1.7 ?Intake / Output; MIVF: RLQ pelvic drain o/p 50 mL, UOP 18552m?GI Imaging:  ?4/20 CT - Fluid-filled and dilated esophagus and stomach, increased when compared with prior CT, concerning for ileus. ?GI Surgeries / Procedures:  ?4/18 - ex-lap  R hemicolectomy, sigmoid colectomy with end colostomy creation ? ?Central access: double-lumen PICC placed 10/26/21 ?TPN  start date: 10/26/21 ? ?Nutritional Goals: ?Goal TPN rate is 90 mL/hr (provides 110 g of protein and 2160 kcals per day) ? ?RD Assessment: ?Estimated Needs ?Total Energy Estimated Needs: 2000-2200 kcal/d ?Total Protein Estimated Needs: 100-110 g/d ?Total Fluid Estimated Needs: 2-2.2 L/d ? ?Current Nutrition:  ?Full liquids and TPN ? ?Plan:  ?Increase TPN to 905mr (goal rate) which meets 100% of pt's estimated needs. ?KCl 64m47m 3 runs ?Electrolytes in TPN: Continue: Na 150 mEq/L, Mg 64mE70m Ca 6mEq/69mand Phos 25mmol33mmax in TPN), Cl:Ac 1:2. Decrease K to 60mEq/L23mth increase in rate pt will get more overall). ?Add standard MVI and trace elements to TPN ?Add thiamine to TPN due to refeeding syndrome x 5 days (through 4/25) ?Initiate Sensitive q6h SSI and adjust as needed  ?F/u tolerance of full liquid diet and ability to wean/d/c TPN ?Monitor TPN labs daily until stable, then on Mon/Thurs. ? ?Robert Barrett AmSherlon Barrett, BCPS ?Please see amion for complete clinical pharmacist phone list ?10/29/2021,11:01 AM ? ?

## 2021-10-30 DIAGNOSIS — K5720 Diverticulitis of large intestine with perforation and abscess without bleeding: Secondary | ICD-10-CM | POA: Diagnosis not present

## 2021-10-30 LAB — MAGNESIUM: Magnesium: 2 mg/dL (ref 1.7–2.4)

## 2021-10-30 LAB — GLUCOSE, CAPILLARY
Glucose-Capillary: 194 mg/dL — ABNORMAL HIGH (ref 70–99)
Glucose-Capillary: 176 mg/dL — ABNORMAL HIGH (ref 70–99)
Glucose-Capillary: 219 mg/dL — ABNORMAL HIGH (ref 70–99)
Glucose-Capillary: 152 mg/dL — ABNORMAL HIGH (ref 70–99)
Glucose-Capillary: 189 mg/dL — ABNORMAL HIGH (ref 70–99)

## 2021-10-30 LAB — BASIC METABOLIC PANEL
Anion gap: 7 (ref 5–15)
BUN: 8 mg/dL (ref 8–23)
CO2: 25 mmol/L (ref 22–32)
Calcium: 8.6 mg/dL — ABNORMAL LOW (ref 8.9–10.3)
Chloride: 102 mmol/L (ref 98–111)
Creatinine, Ser: 0.62 mg/dL (ref 0.61–1.24)
GFR, Estimated: >60 mL/min (ref >60)
Glucose, Bld: 169 mg/dL — ABNORMAL HIGH (ref 70–99)
Potassium: 4.1 mmol/L (ref 3.5–5.1)
Sodium: 134 mmol/L — ABNORMAL LOW (ref 135–145)

## 2021-10-30 LAB — PHOSPHORUS: Phosphorus: 2.8 mg/dL (ref 2.5–4.6)

## 2021-10-30 MED ORDER — TRAVASOL 10 % IV SOLN
INTRAVENOUS | Status: AC
  Filled 2021-10-30: qty 1101.6

## 2021-10-30 MED ORDER — INSULIN ASPART 100 UNIT/ML IJ SOLN: 0.0000 [IU] | Freq: Every day | INTRAMUSCULAR | Status: DC

## 2021-10-30 MED ORDER — INSULIN ASPART 100 UNIT/ML IJ SOLN
0.0000 [IU] | Freq: Three times a day (TID) | INTRAMUSCULAR | Status: DC
  Administered 2021-10-30 – 2021-10-31 (×2): 5 [IU] via SUBCUTANEOUS
  Administered 2021-10-31 (×2): 3 [IU] via SUBCUTANEOUS
  Administered 2021-11-01: 5 [IU] via SUBCUTANEOUS
  Administered 2021-11-01: 2 [IU] via SUBCUTANEOUS
  Administered 2021-11-01: 3 [IU] via SUBCUTANEOUS
  Administered 2021-11-02: 2 [IU] via SUBCUTANEOUS
  Administered 2021-11-02: 3 [IU] via SUBCUTANEOUS
  Administered 2021-11-02: 5 [IU] via SUBCUTANEOUS
  Administered 2021-11-03 (×2): 2 [IU] via SUBCUTANEOUS
  Administered 2021-11-03 – 2021-11-04 (×2): 3 [IU] via SUBCUTANEOUS

## 2021-10-30 NOTE — Progress Notes (Signed)
5 Days Post-Op  ? ?Subjective/Chief Complaint: ?Restless night ? ? ?Objective: ?Vital signs in last 24 hours: ?Temp:  [97.7 ?F (36.5 ?C)-98 ?F (36.7 ?C)] 97.9 ?F (36.6 ?C) (04/23 9179) ?Pulse Rate:  [82-102] 82 (04/23 0803) ?Resp:  [17-24] 17 (04/23 0803) ?BP: (124-142)/(70-77) 124/76 (04/23 0803) ?SpO2:  [90 %-94 %] 92 % (04/23 0803) ?Last BM Date : 10/29/21 ? ?Intake/Output from previous day: ?04/22 0701 - 04/23 0700 ?In: 2692.5 [P.O.:660; I.V.:1528.4; IV Piggyback:504.1] ?Out: 1070 [Urine:1000; Drains:70] ?Intake/Output this shift: ?No intake/output data recorded. ? ?GI: soft mildly distended, approp tender wound clean stool in bag, jp serous, wound clean and dry with some fibrinous exudate, no drainage, fascia intact ?GU yellow urine ? ? ? ?Lab Results:  ?Recent Labs  ?  10/28/21 ?0359 10/29/21 ?1027  ?WBC 15.2* 17.6*  ?HGB 9.9* 10.5*  ?HCT 30.5* 31.7*  ?PLT 568* 598*  ? ? ?BMET ?Recent Labs  ?  10/29/21 ?0422 10/30/21 ?1505  ?NA 134* 134*  ?K 3.6 4.1  ?CL 102 102  ?CO2 26 25  ?GLUCOSE 148* 169*  ?BUN 6* 8  ?CREATININE 0.65 0.62  ?CALCIUM 7.9* 8.6*  ? ? ?PT/INR ?No results for input(s): LABPROT, INR in the last 72 hours. ?ABG ?No results for input(s): PHART, HCO3 in the last 72 hours. ? ?Invalid input(s): PCO2, PO2 ? ?Studies/Results: ?DG Chest Port 1 View ? ?Result Date: 10/29/2021 ?CLINICAL DATA:  Abdominal pain EXAM: PORTABLE CHEST 1 VIEW COMPARISON:  Two days ago FINDINGS: Low volume chest with atelectatic type density at the bases, also seen on prior. No visible pneumoperitoneum. Prior CABG with stable cardiomediastinal contours. Right PICC with tip at the SVC. Artifact from EKG leads. IMPRESSION: Low volume chest with atelectasis at the bases, similar to 2 days prior. Electronically Signed   By: Tiburcio Pea M.D.   On: 10/29/2021 09:03   ? ?Anti-infectives: ?Anti-infectives (From admission, onward)  ? ? Start     Dose/Rate Route Frequency Ordered Stop  ? 10/20/21 2300  piperacillin-tazobactam (ZOSYN)  IVPB 3.375 g       ? 3.375 g ?12.5 mL/hr over 240 Minutes Intravenous Every 8 hours 10/20/21 1854 10/30/21 2359  ? 10/20/21 1700  piperacillin-tazobactam (ZOSYN) IVPB 3.375 g       ? 3.375 g ?100 mL/hr over 30 Minutes Intravenous  Once 10/20/21 1656 10/20/21 1745  ? ?  ? ? ?Assessment/Plan: ?-POD#5 s/p Exploratory laparotomy, right hemicolectomy, sigmoid colectomy with end colostomy 4/18 Dr. Fredricka Bonine ?- intraop findings: Large bowel obstruction with perforated cecum and diffuse feculent peritonitis, sigmoid diverticulitis with dense inflammatory adhesions to the bladder as well as associated localized abscess ?- path diverticulitis sigmoid with abscess, necrosis of cecum- all benign ?- continue drain, currently serosanguinous ?- continue IV antibiotics for 5 days postop ?- TID wet to dry dressing changes to midline abdominal wound ?- WOC consult for colostomy ?- will try soft diet but continue TPN until sure he is tolerating due to prolonged npo ? -pulm status much improved, needs pulm toilet and oob, PT ?  ?ID - zosyn 4/13>> ?FEN - IVF, TPN, fulls ?VTE - lovenox ?Foley - dc 4/21, clear and much better overall ?  ?ABL anemia ?T2DM ?HTN ?HLD ?CAD s/p CABG ?GERD ?Anxiety ?OSA ? ? ?Korver Graybeal Lollie Sails ?10/30/2021 ? ?

## 2021-10-30 NOTE — Progress Notes (Addendum)
PHARMACY - TOTAL PARENTERAL NUTRITION CONSULT NOTE  ? ?Indication: Large bowel obstruction with perforated cecum, NPO > 7 days ? ?Patient Measurements: ?Height: '5\' 10"'  (177.8 cm) ?Weight: 82.7 kg (182 lb 5.1 oz) ?IBW/kg (Calculated) : 73 ? Body mass index is 26.16 kg/m?. ?Usual Weight: 95 kg ? ?Assessment: [name] with past medical history of T2DM, HTN, HLD, CAD s/p CABG who presented on 4/13 to the emergency department with colicky abdominal pain and nausea. CT from 4/13 showed perforated diverticulitis with a 6 mm abscess at which time he was made NPO. IR was unable to drain abscess, however, pt began improving on Zosyn. Pt was given a CLD on 4/15-4/18. Repeat CT on 4/18 showed new moderate free intraperitoneal air c/w sigmoid colon perforation, moderate-severe sigmoid diverticulitis, severe ileus w/ dilated cecum demonstrating penumatosis. These findings lead to emergent surgery on 4/18 - ex-lap  R hemicolectomy, sigmoid colectomy with end colostomy creation. ? ?Prior to admission, patient reports a very poor appetite over the past 6 months. He has noted a gradual weight loss over that time to 197 lb prior to admission. He reports only being able to tolerate a few juices while on the CLD since admission. Pharmacy consulted to initiate and manage TPN on 4/19. Pt tolerating FLQ diet and advanced to soft diet 4/23. Calorie count underway. ? ?Glucose / Insulin: BG 160-194, sSSI (6 units given in last 24h) ?Electrolytes: Na 134, K 4.1, coCa 10.4, Phos 2.8 (max in TPN), Mag 2, others wnl ?Renal: SCr <1, BUN wnl ?Hepatic: LFTs/ Alk Phos/ T.bili wnl, TG 180, Albumin 1.7 ?Intake / Output; MIVF: RLQ pelvic drain o/p 70 mL, UOP 1730m. ?GI Imaging:  ?4/20 CT - Fluid-filled and dilated esophagus and stomach, increased when compared with prior CT, concerning for ileus. ?GI Surgeries / Procedures:  ?4/18 - ex-lap  R hemicolectomy, sigmoid colectomy with end colostomy creation ? ?Central access: double-lumen PICC placed  10/26/21 ?TPN start date: 10/26/21 ? ?Nutritional Goals: ?Goal TPN rate is 90 mL/hr (provides 110 g of protein and 2160 kcals per day) ? ?RD Assessment: ?Estimated Needs ?Total Energy Estimated Needs: 2000-2200 kcal/d ?Total Protein Estimated Needs: 100-110 g/d ?Total Fluid Estimated Needs: 2-2.2 L/d ? ?Current Nutrition:  ?Soft diet and TPN ? ?Plan:  ?Continue TPN at  960mhr (goal rate) which meets 100% of pt's estimated needs. ?Electrolytes in TPN: Continue: Na 150 mEq/L, K 6029mL, Mg 21m43m, Ca 6mEq76m and Phos 25mmo25m(max in TPN), Cl:Ac 1:2. Decrease Ca to 3 mEq/L. ?Add standard MVI and trace elements to TPN ?Add thiamine to TPN due to refeeding syndrome x 5 days (through 4/25) ?Change SSI to moderate scale TIDWC and qHS ?F/u tolerance of soft diet, calorie count, and ability to wean/d/c TPN ?Monitor TPN labs Mon/Thurs. ? ?Safir Michalec Sherlon HandingmD, BCPS ?Please see amion for complete clinical pharmacist phone list ?10/30/2021,8:43 AM ? ?

## 2021-10-30 NOTE — Anesthesia Postprocedure Evaluation (Signed)
Anesthesia Post Note ? ?Patient: Robert Barrett ? ?Procedure(s) Performed: EXPLORATORY LAPAROTOMY, [facility]man Procedure (Abdomen) ? ?  ? ?Patient location during evaluation: PACU ?Anesthesia Type: General ?Level of consciousness: patient cooperative and awake ?Pain management: pain level controlled ?Vital Signs Assessment: post-procedure vital signs reviewed and stable ?Respiratory status: spontaneous breathing, nonlabored ventilation, respiratory function stable and patient connected to nasal cannula oxygen ?Cardiovascular status: blood pressure returned to baseline and stable ?Postop Assessment: no apparent nausea or vomiting ?Anesthetic complications: no ? ? ?No notable events documented. ? ?Last Vitals:  ?Vitals:  ? 10/30/21 0307 10/30/21 0803  ?BP: (!) 142/72 124/76  ?Pulse: 95 82  ?Resp: 20 17  ?Temp: 36.6 ?C 36.6 ?C  ?SpO2: 92% 92%  ?  ?Last Pain:  ?Vitals:  ? 10/30/21 0803  ?TempSrc: Oral  ?PainSc:   ? ? ?  ?  ?  ?  ?  ?  ? ?Mariel Lukins ? ? ? ? ?

## 2021-10-30 NOTE — Progress Notes (Signed)
?      ?                 PROGRESS NOTE ? ?      ?PATIENT DETAILS ?Name: Robert Barrett ?Age: 76 y.o. ?Sex: male ?Date of Birth: 01/01/1946 ?Admit Date: 10/20/2021 ?Admitting Physician Maretta Bees, MD ?[id number], [name] is a 76 y.o.  male with past medical history of DM-2, HTN, HLD, CAD s/p CABG-presented with abdominal pain-found to have perforated diverticulitis with abscess.  Patient was admitted to the hospitalist service-started on Zosyn and other supportive care-General surgery followed closely, repeat CT scan was performed on 4/18 which showed worsening changes with moderate free intraperitoneal air, pneumatosis.  Patient was subsequently taken to the operating room by general surgery where he underwent right hemicolectomy, sigmoid colectomy and end ostomy placement.  See below for further details. ? ?Significant events: ?4/13>> admit to [facility] for perforated diverticulitis with 6 cm abscess. ?4/14>> per IR-no appropriate window to place drain. ?4/18>> repeat CT abdomen worsening free air/pneumatosis-surgery performed right hemicolectomy/sigmoid colectomy and end ostomy placement. ?4/19>> mild hypotension that responded to IVF. ?4/20>> developed hypoxemia requiring around 8 L of O2.  CTA chest negative for PE. ? ?Significant studies: ?4/13>> CT abdomen/pelvis: Perforated diverticulitis-6 cm abscess.  Upstream distention of large bowel-concerning for secondary large bowel obstruction. ?4/18>> CT abdomen/pelvis: New moderate free intraperitoneal air, severe ileus, dilated cecum with pneumatosis ? ?Significant microbiology data: ?None ? ?Procedures: ?None ? ?Consults: ?General surgery ? ?Subjective:  Patient in bed, appears comfortable, denies any headache, no fever, no chest pain or pressure, no shortness of breath , no abdominal pain. No focal weakness. ? ? ?Objective: ?Vitals: ?Blood pressure 124/76, pulse 82, temperature 97.9 ?F (36.6 ?C), temperature source Oral, resp.  rate 17, height 5\' 10"  (1.778 m), weight 82.7 kg, SpO2 92 %.  ? ?Exam: ? ?Awake Alert, No new F.N deficits, Normal affect ?[place].AT,PERRAL ?Supple Neck, No JVD,   ?Symmetrical Chest wall movement, Good air movement bilaterally, CTAB ?RRR,No Gallops, Rubs or new Murmurs,  ?+ve B.Sounds, Abd Soft, No tenderness, abdominal dressing in place with JP drain, colostomy bag has liquid stool, right arm PICC line ?No Cyanosis, Clubbing or edema  ? ? ?Assessment/Plan: ? ?Perforated diverticulitis-s/p exploratory laparotomy-right hemicolectomy/sigmoid colectomy with end ostomy on 4/18: Stable-General surgery following and directing care-remains on Zosyn/TNA per pharmacy. + liquid stool in ostomy bag.  On full liquids by mouth per general surgery will do calorie count if appropriate will try and stop TNA in the next few days. ? ?Acute hypoxic respiratory failure: Likely due to atelectasis/possible PNA-on Zosyn.  Was on 8 L of oxygen yesterday-has improved with aggressive pulmonary toileting/use of incentive spirometry-on 3 L of oxygen.  CTA chest negative for PE.  Continue mobilization-and other supportive care.  ? ?HTN: Had brief hypotension postoperatively-BP now stable-but continues to be off all antihypertensives for now.  Resume when able.   ? ?HLD: Resume fenofibrate/statin  ? ?CAD s/p CABG approximately 10 years ago: No anginal symptoms-resume aspirin/statin ?  ?GERD: Continue PPI ?  ?Anxiety: Continue as needed Klonopin ?  ?Insomnia: Continue as needed temazepam ? ?Hypophosphatemia.  Replaced.   ? ?OSA: CPAP nightly, noncompliant strictly counseled.  Requested to get home CPAP machine on 10/29/2021 for better complaince. ? ?PICC line in place.  Right arm, remove once TNA and IV antibiotics have finished. ? ?DM-2 (A1c 6.0 on 4/14): Diet controlled at home-monitor with SSI.  ? ?Recent Labs  ?  10/29/21 ?2351 10/30/21 ?5102 10/30/21 ?0802  ?GLUCAP 160* 194* 176*  ?  ?  ? ? ?BMI: ?Estimated body mass index is 26.16 kg/m? as  calculated from the following: ?  Height as of this encounter: 5\' 10"  (1.778 m). ?  Weight as of this encounter: 82.7 kg.  ? ?Code status: ?  Code Status: Full Code  ? ?DVT Prophylaxis: ?enoxaparin (LOVENOX) injection 40 mg Start: 10/21/21 1200 ?SCDs Start: 10/20/21 1800 ?Start prophylactic Lovenox 4/14 ?  ?Family Communication: Spouse-[phone]-at bedside on 4/20. ? ? ?Disposition Plan: ?Status is: Inpatient ?Remains inpatient appropriate because: Perforated diverticulitis s/p laparotomy with colectomy and ostomy placement-not yet stable for discharge. ?Planned Discharge Destination:Home versus SNF ? ? ?Diet: ?Diet Order   ? ?       ?  DIET SOFT Room service appropriate? Yes; Fluid consistency: Thin  Diet effective now       ?  ? ?  ?  ? ?  ?  ? ? ?Antimicrobial agents: ?Anti-infectives (From admission, onward)  ? ? Start     Dose/Rate Route Frequency Ordered Stop  ? 10/20/21 2300  piperacillin-tazobactam (ZOSYN) IVPB 3.375 g       ? 3.375 g ?12.5 mL/hr over 240 Minutes Intravenous Every 8 hours 10/20/21 1854 10/30/21 2359  ? 10/20/21 1700  piperacillin-tazobactam (ZOSYN) IVPB 3.375 g       ? 3.375 g ?100 mL/hr over 30 Minutes Intravenous  Once 10/20/21 1656 10/20/21 1745  ? ?  ? ? ? ?MEDICATIONS: ?Scheduled Meds: ? acetaminophen  1,000 mg Oral Q6H  ? aspirin EC  81 mg Oral Daily  ? Chlorhexidine Gluconate Cloth  6 each Topical Daily  ? enoxaparin (LOVENOX) injection  40 mg Subcutaneous Q24H  ? feeding supplement  1 Container Oral TID BM  ? fenofibrate  160 mg Oral Daily  ? fluticasone  2 spray Each Nare QHS  ? insulin aspart  0-15 Units Subcutaneous TID WC  ? insulin aspart  0-5 Units Subcutaneous QHS  ? pantoprazole  40 mg Oral BID  ? rosuvastatin  10 mg Oral Daily  ? ?Continuous Infusions: ? sodium chloride 10 mL/hr at 10/28/21 2107  ? methocarbamol (ROBAXIN) IV    ? piperacillin-tazobactam (ZOSYN)  IV 3.375 g (10/29/21 2328)  ? TPN ADULT (ION) 90 mL/hr at 10/30/21 0935  ? ?PRN Meds:.sodium chloride,  albuterol, artificial tears, clonazePAM, HYDROmorphone (DILAUDID) injection, methocarbamol (ROBAXIN) IV, ondansetron **OR** ondansetron (ZOFRAN) IV, oxyCODONE, sodium chloride flush, temazepam ? ? ?I have personally reviewed following labs and imaging studies ? ?LABORATORY DATA: ? ?Recent Labs  ?Lab 10/26/21 ?0750 10/26/21 ?1303 10/27/21 ?0405 10/28/21 ?0359 10/29/21 ?1027  ?WBC 15.2* 17.3* 14.7* 15.2* 17.6*  ?HGB 11.8* 12.7* 11.2* 9.9* 10.5*  ?HCT 36.0* 37.5* 33.7* 30.5* 31.7*  ?PLT 615* 663* 629* 568* 598*  ?MCV 91.4 90.4 91.3 91.6 90.1  ?MCH 29.9 30.6 30.4 29.7 29.8  ?MCHC 32.8 33.9 33.2 32.5 33.1  ?RDW 13.9 13.9 13.9 14.0 14.4  ? ? ?Recent Labs  ?Lab 10/25/21 ?0146 10/26/21 ?0122 10/27/21 ?0405 10/28/21 ?0359 10/29/21 ?0422 10/29/21 ?1027 10/30/21 ?11/01/21  ?NA 131* 131* 130* 133* 134*  --  134*  ?K 3.4* 3.8 3.0* 3.5 3.6  --  4.1  ?CL 102 103 102 103 102  --  102  ?CO2 19* 18* 22 22 26   --  25  ?GLUCOSE 176* 160* 131* 166* 148*  --  169*  ?BUN 5* 12 16 9  6*  --  8  ?CREATININE 0.78 0.97  0.76 0.70 0.65  --  0.62  ?CALCIUM 8.4* 8.2* 7.9* 8.0* 7.9*  --  8.6*  ?AST  --   --  27  --   --   --   --   ?ALT  --   --  16  --   --   --   --   ?ALKPHOS  --   --  142  --   --   --   --   ?BILITOT  --   --  0.7  --   --   --   --   ?ALBUMIN  --   --  1.7*  --   --   --   --   ?MG 2.4  --  2.0 1.8 1.9  --  2.0  ?BNP  --   --  144.4*  --   --  447.0*  --   ? ? ?  ?  ? ?Urine analysis: ?   ?Component Value Date/Time  ? COLORURINE YELLOW 10/20/2021 0855  ? APPEARANCEUR HAZY (A) 10/20/2021 0855  ? LABSPEC 1.012 10/20/2021 0855  ? PHURINE 6.0 10/20/2021 0855  ? GLUCOSEU NEGATIVE 10/20/2021 0855  ? HGBUR NEGATIVE 10/20/2021 0855  ? BILIRUBINUR NEGATIVE 10/20/2021 0855  ? KETONESUR NEGATIVE 10/20/2021 0855  ? PROTEINUR NEGATIVE 10/20/2021 0855  ? UROBILINOGEN 1.0 04/11/2007 1627  ? NITRITE NEGATIVE 10/20/2021 0855  ? LEUKOCYTESUR NEGATIVE 10/20/2021 0855  ? ? ?Sepsis Labs: ?Lactic Acid, Venous ?No results found for:  LATICACIDVEN ? ?MICROBIOLOGY: ?No results found for this or any previous visit (from the past 240 hour(s)). ? ?RADIOLOGY STUDIES/RESULTS: ?DG Chest Port 1 View ? ?Result Date: 10/29/2021 ?CLINICAL DATA:  Abdominal pain EXAM: PORT

## 2021-10-31 DIAGNOSIS — K5720 Diverticulitis of large intestine with perforation and abscess without bleeding: Secondary | ICD-10-CM | POA: Diagnosis not present

## 2021-10-31 LAB — CBC WITH DIFFERENTIAL/PLATELET
Abs Immature Granulocytes: 1.28 10*3/uL — ABNORMAL HIGH (ref 0.00–0.07)
Basophils Absolute: 0.1 10*3/uL (ref 0.0–0.1)
Basophils Relative: 0 %
Eosinophils Absolute: 0.3 10*3/uL (ref 0.0–0.5)
Eosinophils Relative: 2 %
HCT: 30.5 % — ABNORMAL LOW (ref 39.0–52.0)
Hemoglobin: 10.2 g/dL — ABNORMAL LOW (ref 13.0–17.0)
Immature Granulocytes: 7 %
Lymphocytes Relative: 5 %
Lymphs Abs: 1 10*3/uL (ref 0.7–4.0)
MCH: 30.1 pg (ref 26.0–34.0)
MCHC: 33.4 g/dL (ref 30.0–36.0)
MCV: 90 fL (ref 80.0–100.0)
Monocytes Absolute: 2 10*3/uL — ABNORMAL HIGH (ref 0.1–1.0)
Monocytes Relative: 11 %
Neutro Abs: 14.4 10*3/uL — ABNORMAL HIGH (ref 1.7–7.7)
Neutrophils Relative %: 75 %
Platelets: 536 10*3/uL — ABNORMAL HIGH (ref 150–400)
RBC: 3.39 MIL/uL — ABNORMAL LOW (ref 4.22–5.81)
RDW: 14.4 % (ref 11.5–15.5)
WBC: 19.1 10*3/uL — ABNORMAL HIGH (ref 4.0–10.5)
nRBC: 0.2 % (ref 0.0–0.2)

## 2021-10-31 LAB — COMPREHENSIVE METABOLIC PANEL
ALT: 18 U/L (ref 0–44)
AST: 32 U/L (ref 15–41)
Albumin: 1.7 g/dL — ABNORMAL LOW (ref 3.5–5.0)
Alkaline Phosphatase: 76 U/L (ref 38–126)
Anion gap: 5 (ref 5–15)
BUN: 10 mg/dL (ref 8–23)
CO2: 25 mmol/L (ref 22–32)
Calcium: 8.8 mg/dL — ABNORMAL LOW (ref 8.9–10.3)
Chloride: 104 mmol/L (ref 98–111)
Creatinine, Ser: 0.67 mg/dL (ref 0.61–1.24)
GFR, Estimated: >60 mL/min (ref >60)
Glucose, Bld: 173 mg/dL — ABNORMAL HIGH (ref 70–99)
Potassium: 4.2 mmol/L (ref 3.5–5.1)
Sodium: 134 mmol/L — ABNORMAL LOW (ref 135–145)
Total Bilirubin: 0.5 mg/dL (ref 0.3–1.2)
Total Protein: 5 g/dL — ABNORMAL LOW (ref 6.5–8.1)

## 2021-10-31 LAB — MAGNESIUM: Magnesium: 1.8 mg/dL (ref 1.7–2.4)

## 2021-10-31 LAB — GLUCOSE, CAPILLARY
Glucose-Capillary: 179 mg/dL — ABNORMAL HIGH (ref 70–99)
Glucose-Capillary: 199 mg/dL — ABNORMAL HIGH (ref 70–99)
Glucose-Capillary: 221 mg/dL — ABNORMAL HIGH (ref 70–99)
Glucose-Capillary: 192 mg/dL — ABNORMAL HIGH (ref 70–99)

## 2021-10-31 LAB — PHOSPHORUS: Phosphorus: 3.3 mg/dL (ref 2.5–4.6)

## 2021-10-31 LAB — TRIGLYCERIDES: Triglycerides: 175 mg/dL — ABNORMAL HIGH (ref <150)

## 2021-10-31 MED ORDER — ENSURE ENLIVE PO LIQD
237.0000 mL | Freq: Two times a day (BID) | ORAL | Status: DC
  Administered 2021-11-01: 237 mL via ORAL

## 2021-10-31 MED ORDER — GLUCERNA SHAKE PO LIQD
237.0000 mL | Freq: Three times a day (TID) | ORAL | Status: DC
Start: 2021-10-31 — End: 2021-10-31

## 2021-10-31 MED ORDER — TRAVASOL 10 % IV SOLN
INTRAVENOUS | Status: DC
  Filled 2021-10-31: qty 550.8

## 2021-10-31 MED ORDER — HYDRALAZINE HCL 20 MG/ML IJ SOLN
10.0000 mg | Freq: Four times a day (QID) | INTRAMUSCULAR | Status: DC | PRN
Start: 2021-10-31 — End: 2021-11-04

## 2021-10-31 NOTE — Progress Notes (Signed)
PHARMACY - TOTAL PARENTERAL NUTRITION CONSULT NOTE  ? ?Indication: Large bowel obstruction with perforated cecum, NPO > 7 days ? ?Patient Measurements: ?Height: 5\' 10"  (177.8 cm) ?Weight: 82.7 kg (182 lb 5.1 oz) ?IBW/kg (Calculated) : 73 ? Body mass index is 26.16 kg/m?. ?Usual Weight: 95 kg ? ?Assessment: [name] with past medical history of T2DM, HTN, HLD, CAD s/p CABG who presented on 4/13 to the emergency department with colicky abdominal pain and nausea. CT from 4/13 showed perforated diverticulitis with a 6 mm abscess at which time he was made NPO. IR was unable to drain abscess, however, pt began improving on Zosyn. Pt was given a CLD on 4/15-4/18. Repeat CT on 4/18 showed new moderate free intraperitoneal air c/w sigmoid colon perforation, moderate-severe sigmoid diverticulitis, severe ileus w/ dilated cecum demonstrating penumatosis. These findings lead to emergent surgery on 4/18 - ex-lap  R hemicolectomy, sigmoid colectomy with end colostomy creation. ? ?Prior to admission, patient reports a very poor appetite over the past 6 months. He has noted a gradual weight loss over that time to 197 lb prior to admission. He reports only being able to tolerate a few juices while on the CLD since admission. Pharmacy consulted to initiate and manage TPN on 4/19.  ? ?Pt tolerating FLQ diet and advanced to soft diet 4/23. Calorie count ongoing - RN states not eating much yesterday but official results from calorie count not documented yet. Pt states he is eating but cannot eat that much as he gets full quickly.  ? ?Glucose / Insulin: BG 152-219, moderate SSI (8 units given in last 24h) ?Electrolytes: Na 134 (max in TPN), K 4.2, coCa up to 10.6, Phos 3.3 (max in TPN), Mag 1.8 ?Renal: SCr <1, BUN wnl ?Hepatic: LFTs wnl, TG 175, Albumin 1.7 ?Intake / Output; MIVF: RLQ pelvic drain o/p 70 mL, UOP not charted. ?GI Imaging:  ?4/20 CT - Fluid-filled and dilated esophagus and stomach, increased when compared with prior CT,  concerning for ileus. ?GI Surgeries / Procedures:  ?4/18 - ex-lap  R hemicolectomy, sigmoid colectomy with end colostomy creation ? ?Central access: double-lumen PICC placed 10/26/21 ?TPN start date: 10/26/21 ? ?Nutritional Goals: ?Goal TPN rate is 90 mL/hr (provides 110 g of protein and 2160 kcals per day) ? ?RD Assessment: ?Estimated Needs ?Total Energy Estimated Needs: 2000-2200 kcal/d ?Total Protein Estimated Needs: 100-110 g/d ?Total Fluid Estimated Needs: 2-2.2 L/d ? ?Current Nutrition:  ?Soft diet and TPN ? ?Plan:  ?Change TPN to  45 mL/hr (1/2 goal rate) which provides 55 gm protein and 1080 kcal. ?Electrolytes in TPN: Continue: Na 150 mEq/L, K 94mEq/L, Mg 55mEq/L, and Phos 58mmol/L (max in TPN), Cl:Ac 1:2. Decrease Ca to 0 mEq/L. ?Add standard MVI and trace elements to TPN ?Continue SSI moderate scale TIDWC and qHS ?Monitor TPN labs Mon/Thurs. ?Per Margie Billet, CCS PA, will cut TPN to 1/2 rate tonight in hopes that can be d/c soon. F/u calorie count. ? ?Sherlon Handing, PharmD, BCPS ?Please see amion for complete clinical pharmacist phone list ?10/31/2021,9:35 AM ? ?

## 2021-10-31 NOTE — Progress Notes (Signed)
PT Cancellation Note ? ?Patient Details ?Name: Robert Barrett ?MRN: [id number] ?DOB: 05-Aug-1945 ? ? ?Cancelled Treatment:    Reason Eval/Treat Not Completed: Fatigue/lethargy limiting ability to participate ? ?Patient just back to bed per family and needs to rest before trying to participate in PT. Will reattempt later today ? ? ?Jerolyn Center, PT ?Acute Rehabilitation Services  ?Pager [phone] ?Office [phone] ? ?Robert Barrett ?10/31/2021, 11:26 AM ?

## 2021-10-31 NOTE — Progress Notes (Signed)
Physical Therapy Treatment ?Patient Details ?Name: Robert Barrett ?MRN: [id number] ?DOB: 17-Apr-1946 ?Today's Date: 10/31/2021 ? ? ?History of Present Illness 76 y.o. male presented to the hospital 10/20/21 with abdominal pain. CT scan of his abdomen revealed perforated diverticulitis with a 6 cm abscess. 4/18 s/p Exploratory laparotomy, right hemicolectomy, sigmoid colectomy with end colostomy    PMH significant of DM-2, HTN, HLD, CAD s/p CABG repeat CT scan was performed on 4/18 right hemicolectomy, sigmoid colectomy and end ostomy placement ? ?  ?PT Comments  ? ? Patient able to progress to ambulating without a RW with no imbalance noted. Close guarding for safety. May meet all goals next session.    ?Recommendations for follow up therapy are one component of a multi-disciplinary discharge planning process, led by the attending physician.  Recommendations may be updated based on patient status, additional functional criteria and insurance authorization. ? ?Follow Up Recommendations ? No PT follow up ?  ?  ?Assistance Recommended at Discharge PRN  ?Patient can return home with the following A little help with walking and/or transfers;Assistance with cooking/housework;Help with stairs or ramp for entrance ?  ?Equipment Recommendations ? None recommended by PT  ?  ?Recommendations for Other Services   ? ? ?  ?Precautions / Restrictions Precautions ?Precautions: Fall ?Precaution Comments: RLQ JP drain ?Restrictions ?Weight Bearing Restrictions: No  ?  ? ?Mobility ? Bed Mobility ?Overal bed mobility: Needs Assistance ?Bed Mobility: Rolling, Sidelying to Sit, Sit to Sidelying ?Rolling: Modified independent (Device/Increase time) ?Sidelying to sit: Modified independent (Device/Increase time), HOB elevated ?  ?  ?Sit to sidelying: Supervision, HOB elevated ?General bed mobility comments: pt likes to raise HOB and then roll onto his side (attempted to have him try from [facility] flat and pt refused); return to side to supine  required cues for getting scooted up to [facility] (using rails) ?  ? ?Transfers ?Overall transfer level: Needs assistance ?Equipment used: None ?Transfers: Sit to/from Stand ?Sit to Stand: Min guard ?  ?  ?  ?  ?  ?General transfer comment: denied need for RW and not reaching for UE support during transfers (x6 total) ?  ? ?Ambulation/Gait ?Ambulation/Gait assistance: Min guard ?Gait Distance (Feet): 250 Feet ?Assistive device: None ?Gait Pattern/deviations: Decreased stride length, Step-through pattern, Wide base of support ?Gait velocity: better pace without RW (tended to push it too far ahead and then go faster and faster to try to keep up) ?Gait velocity interpretation: 1.31 - 2.62 ft/sec, indicative of limited community ambulator ?  ?General Gait Details: denied need for RW; closeguarding assist with no physical assist needed ? ? ?Stairs ?  ?  ?  ?  ?  ? ? ?Wheelchair Mobility ?  ? ?Modified Rankin (Stroke Patients Only) ?  ? ? ?  ?Balance Overall balance assessment: Needs assistance ?Sitting-balance support: No upper extremity supported, Feet supported ?Sitting balance-Leahy Scale: Fair ?  ?  ?Standing balance support: During functional activity ?Standing balance-Leahy Scale: Good ?  ?  ?  ?  ?  ?  ?  ?  ?  ?  ?  ?  ?  ? ?  ?Cognition Arousal/Alertness: Awake/alert ?Behavior During Therapy: [facility] for tasks assessed/performed ?Overall Cognitive Status: Within Functional Limits for tasks assessed ?  ?  ?  ?  ?  ?  ?  ?  ?  ?  ?  ?  ?  ?  ?  ?  ?  ?  ?  ? ?  ?Exercises Other  Exercises ?Other Exercises: sit to stand x 5 reps for strengthening and balance ? ?  ?General Comments   ?  ?  ? ?Pertinent Vitals/Pain Pain Assessment ?Pain Assessment: Faces ?Faces Pain Scale: Hurts little more ?Pain Location: abdomen ?Pain Descriptors / Indicators: Tightness ?Pain Intervention(s): Limited activity within patient's tolerance  ? ? ?Home Living   ?  ?  ?  ?  ?  ?  ?  ?  ?  ?   ?  ?Prior Function    ?  ?  ?   ? ?PT Goals (current  goals can now be found in the care plan section) Acute Rehab PT Goals ?Patient Stated Goal: decr pain and go home ?Time For Goal Achievement: 11/10/21 ?Potential to Achieve Goals: Good ?Progress towards PT goals: Progressing toward goals ? ?  ?Frequency ? ? ? Min 3X/week ? ? ? ?  ?PT Plan    ? ? ?Co-evaluation   ?  ?  ?  ?  ? ?  ?AM-PAC PT "6 Clicks" Mobility   ?Outcome Measure ? Help needed turning from your back to your side while in a flat bed without using bedrails?: None ?Help needed moving from lying on your back to sitting on the side of a flat bed without using bedrails?: A Little ?Help needed moving to and from a bed to a chair (including a wheelchair)?: A Little ?Help needed standing up from a chair using your arms (e.g., wheelchair or bedside chair)?: A Little ?Help needed to walk in hospital room?: A Little ?Help needed climbing 3-5 steps with a railing? : A Little ?6 Click Score: 19 ? ?  ?End of Session   ?Activity Tolerance: Patient tolerated treatment well ?Patient left: with call bell/phone within reach;with family/visitor present;in bed ?Nurse Communication: Mobility status ?PT Visit Diagnosis: Difficulty in walking, not elsewhere classified (R26.2);Pain ?Pain - Right/Left:  (midline incision) ?Pain - part of body:  (abdomen) ?  ? ? ?Time: 0263-7858 ?PT Time Calculation (min) (ACUTE ONLY): 14 min ? ?Charges:  $Gait Training: 8-22 mins          ?          ? ? ?Jerolyn Center, PT ?Acute Rehabilitation Services  ?Pager [phone] ?Office [phone] ? ? ? ?Scherrie November Venita Seng ?10/31/2021, 1:15 PM ? ?

## 2021-10-31 NOTE — Progress Notes (Signed)
[facility] ?Progress Note ? ?6 Days Post-Op  ?Subjective: ?CC-  ?Daughter at bedside. ?Tolerating diet but states that he gets full quickly. Denies n/v. Colostomy productive. Ambulated in the hall and spent a couple hours in the chair yesterday. ?WBC up 19.1, afebrile. ? ?Objective: ?Vital signs in last 24 hours: ?Temp:  [98 ?F (36.7 ?C)-98.6 ?F (37 ?C)] 98.6 ?F (37 ?C) (04/24 0732) ?Pulse Rate:  [80-92] 90 (04/24 0333) ?Resp:  [17-23] 17 (04/24 0732) ?BP: (111-140)/(55-77) 136/77 (04/24 0732) ?SpO2:  [92 %-99 %] 99 % (04/24 0333) ?Last BM Date : 10/31/21 ? ?Intake/Output from previous day: ?04/23 0701 - 04/24 0700 ?In: 2652.6 [P.O.:960; I.V.:1434.2; IV Piggyback:258.4] ?Out: 950 [Drains:50; Stool:900] ?Intake/Output this shift: ?No intake/output data recorded. ? ?PE: ?Gen:  Alert, NAD, pleasant ?Pulm: rate and effort normal ?Abd: soft, mild distension, appropriately tender over wound, open midline pale pink with some fibrinous exdate, ostomy with air and stool in bad, JP serosanguinous ? ?Lab Results:  ?Recent Labs  ?  10/29/21 ?1027 10/31/21 ?0335  ?WBC 17.6* 19.1*  ?HGB 10.5* 10.2*  ?HCT 31.7* 30.5*  ?PLT 598* 536*  ? ?BMET ?Recent Labs  ?  10/30/21 ?[id number] 10/31/21 ?0335  ?NA 134* 134*  ?K 4.1 4.2  ?CL 102 104  ?CO2 25 25  ?GLUCOSE 169* 173*  ?BUN 8 10  ?CREATININE 0.62 0.67  ?CALCIUM 8.6* 8.8*  ? ?PT/INR ?No results for input(s): LABPROT, INR in the last 72 hours. ?CMP  ?   ?Component Value Date/Time  ? NA 134 (L) 10/31/2021 0335  ? NA 137 01/19/2021 0000  ? K 4.2 10/31/2021 0335  ? CL 104 10/31/2021 0335  ? CO2 25 10/31/2021 0335  ? GLUCOSE 173 (H) 10/31/2021 0335  ? BUN 10 10/31/2021 0335  ? BUN 6 01/19/2021 0000  ? CREATININE 0.67 10/31/2021 0335  ? CREATININE 1.17 04/13/2016 1422  ? CALCIUM 8.8 (L) 10/31/2021 0335  ? PROT 5.0 (L) 10/31/2021 0335  ? ALBUMIN 1.7 (L) 10/31/2021 0335  ? AST 32 10/31/2021 0335  ? ALT 18 10/31/2021 0335  ? ALKPHOS 76 10/31/2021 0335  ? BILITOT 0.5 10/31/2021 0335  ?  GFRNONAA >60 10/31/2021 0335  ? GFRNONAA 62 04/13/2016 1422  ? GFRAA 72 04/13/2016 1422  ? ?Lipase  ?   ?Component Value Date/Time  ? LIPASE 28 10/20/2021 0852  ? ? ? ? ? ?Studies/Results: ?No results found. ? ?Anti-infectives: ?Anti-infectives (From admission, onward)  ? ? Start     Dose/Rate Route Frequency Ordered Stop  ? 10/20/21 2300  piperacillin-tazobactam (ZOSYN) IVPB 3.375 g       ? 3.375 g ?12.5 mL/hr over 240 Minutes Intravenous Every 8 hours 10/20/21 1854 10/30/21 2359  ? 10/20/21 1700  piperacillin-tazobactam (ZOSYN) IVPB 3.375 g       ? 3.375 g ?100 mL/hr over 30 Minutes Intravenous  Once 10/20/21 1656 10/20/21 1745  ? ?  ? ? ? ?Assessment/Plan ?-POD#56 s/p Exploratory laparotomy, right hemicolectomy, sigmoid colectomy with end colostomy 4/18 Dr. Kae Heller ?- intraop findings: Large bowel obstruction with perforated cecum and diffuse feculent peritonitis, sigmoid diverticulitis with dense inflammatory adhesions to the bladder as well as associated localized abscess ?- path diverticulitis sigmoid with abscess, necrosis of cecum- all benign ?- continue drain, currently serosanguinous ?- completed IV antibiotics for 5 days postop ?- BID wet to dry dressing changes to midline abdominal wound ?- WOC following for colostomy ?- tolerating soft diet and colostomy functioning. Wean TPN to 1/2. Primary team has started a  calorie count ?- WBC rising. Will obtain CT scan today to evaluate for intraabdominal abscess ?  ?ID - zosyn 4/13>>4/23 ?FEN - IVF, 1/2 TPN, soft diet ?VTE - lovenox ?Foley - dc 4/21, out and voiding ?  ?ABL anemia ?T2DM ?HTN ?HLD ?CAD s/p CABG ?GERD ?Anxiety ?OSA ? ? ? LOS: 11 days  ? ? ?Wellington Hampshire, PA-C ?[facility] ?10/31/2021, 9:02 AM ?Please see Amion for pager number during day hours 7:00am-4:30pm ? ?

## 2021-10-31 NOTE — Progress Notes (Addendum)
?      ?                 PROGRESS NOTE ? ?      ?PATIENT DETAILS ?Name: Robert Barrett ?Age: 76 y.o. ?Sex: male ?Date of Birth: 1945/09/30 ?Admit Date: 10/20/2021 ?Admitting Physician Maretta Bees, MD ?CNO:[name] is a 76 y.o.  male with past medical history of DM-2, HTN, HLD, CAD s/p CABG-presented with abdominal pain-found to have perforated diverticulitis with abscess.  Patient was admitted to the hospitalist service-started on Zosyn and other supportive care-General surgery followed closely, repeat CT scan was performed on 4/18 which showed worsening changes with moderate free intraperitoneal air, pneumatosis.  Patient was subsequently taken to the operating room by general surgery where he underwent right hemicolectomy, sigmoid colectomy and end ostomy placement.  See below for further details. ? ?Significant events: ?4/13>> admit to [facility] for perforated diverticulitis with 6 cm abscess. ?4/14>> per IR-no appropriate window to place drain. ?4/18>> repeat CT abdomen worsening free air/pneumatosis-surgery performed right hemicolectomy/sigmoid colectomy and end ostomy placement. ?4/19>> mild hypotension that responded to IVF. ?4/20>> developed hypoxemia requiring around 8 L of O2.  CTA chest negative for PE. ? ?Significant studies: ?4/13>> CT abdomen/pelvis: Perforated diverticulitis-6 cm abscess.  Upstream distention of large bowel-concerning for secondary large bowel obstruction. ?4/18>> CT abdomen/pelvis: New moderate free intraperitoneal air, severe ileus, dilated cecum with pneumatosis ? ?Significant microbiology data: ?None ? ?Procedures: ?None ? ?Consults: ?General surgery ? ?Subjective:  Patient in bed, appears comfortable, denies any headache, no fever, no chest pain or pressure, no shortness of breath , no abdominal pain. No new focal weakness. ? ? ? ?Objective: ?Vitals: ?Blood pressure 136/77, pulse 90, temperature 98.6 ?F (37 ?C), temperature source Oral, resp.  rate 17, height 5\' 10"  (1.778 m), weight 82.7 kg, SpO2 99 %.  ? ?Exam: ? ?Awake Alert, No new F.N deficits, Normal affect ?[place].AT,PERRAL ?Supple Neck, No JVD,   ?Symmetrical Chest wall movement, Good air movement bilaterally, CTAB ?RRR,No Gallops, Rubs or new Murmurs,  ?+ve B.Sounds, Abd Soft, No tenderness, abdominal dressing in place with JP drain, colostomy bag has liquid stool, right arm PICC line ?No Cyanosis, Clubbing or edema  ? ? ? ?Assessment/Plan: ? ?Perforated diverticulitis-s/p exploratory laparotomy-right hemicolectomy/sigmoid colectomy with end ostomy on 4/18: Stable-General surgery following and directing care-remains on Zosyn/TNA per pharmacy. + liquid stool in ostomy bag.  On full liquids by mouth per general surgery will do calorie count if appropriate will try and stop TNA on 11/01/21.  Does have nonspecific leukocytosis will defer any abdominal imaging to general surgery ? ?Acute hypoxic respiratory failure: Likely due to atelectasis/possible PNA-on Zosyn.  CTA stable negative for PE, much improved with supportive care, encouraged to sit up in chair use I-S and flutter valve for pulmonary toiletry.  Advance activity and titrate down oxygen ? ?HTN: BP gradually improving for now as needed IV hydralazine. ? ?HLD: Resume fenofibrate/statin  ? ?CAD s/p CABG approximately 10 years ago: No anginal symptoms-resume aspirin/statin ?  ?GERD: Continue PPI ?  ?Anxiety: Continue as needed Klonopin ?  ?Insomnia: Continue as needed temazepam ? ?Hypophosphatemia.  Replaced.   ? ?OSA: CPAP nightly, noncompliant strictly counseled.  Requested to get home CPAP machine on 10/29/2021 for better complaince. ? ?PICC line in place.  Right arm, remove once TNA and IV antibiotics have finished. ? ?DM-2 (A1c 6.0 on 4/14): Diet controlled at home-monitor with SSI.  ? ?Recent Labs  ?  10/30/21 ?1751 10/30/21 ?2303 10/31/21 ?4098  ?GLUCAP 152* 189* 179*  ?  ?  ? ? ?BMI: ?Estimated body mass index is 26.16 kg/m? as calculated  from the following: ?  Height as of this encounter: 5\' 10"  (1.778 m). ?  Weight as of this encounter: 82.7 kg.  ? ?Code status: ?  Code Status: Full Code  ? ?DVT Prophylaxis: ?enoxaparin (LOVENOX) injection 40 mg Start: 10/21/21 1200 ?SCDs Start: 10/20/21 1800 ?Start prophylactic Lovenox 4/14 ?  ?Family Communication: Spouse-[phone]-at bedside on 10/30/2021, daughter bedside 10/31/2021 ? ? ?Disposition Plan: ?Status is: Inpatient ?Remains inpatient appropriate because: Perforated diverticulitis s/p laparotomy with colectomy and ostomy placement-not yet stable for discharge. ?Planned Discharge Destination:Home versus SNF ? ? ?Diet: ?Diet Order   ? ?       ?  DIET SOFT Room service appropriate? Yes; Fluid consistency: Thin  Diet effective now       ?  ? ?  ?  ? ?  ?  ? ? ?Antimicrobial agents: ?Anti-infectives (From admission, onward)  ? ? Start     Dose/Rate Route Frequency Ordered Stop  ? 10/20/21 2300  piperacillin-tazobactam (ZOSYN) IVPB 3.375 g       ? 3.375 g ?12.5 mL/hr over 240 Minutes Intravenous Every 8 hours 10/20/21 1854 10/30/21 2359  ? 10/20/21 1700  piperacillin-tazobactam (ZOSYN) IVPB 3.375 g       ? 3.375 g ?100 mL/hr over 30 Minutes Intravenous  Once 10/20/21 1656 10/20/21 1745  ? ?  ? ? ? ?MEDICATIONS: ?Scheduled Meds: ? acetaminophen  1,000 mg Oral Q6H  ? aspirin EC  81 mg Oral Daily  ? Chlorhexidine Gluconate Cloth  6 each Topical Daily  ? enoxaparin (LOVENOX) injection  40 mg Subcutaneous Q24H  ? feeding supplement  1 Container Oral TID BM  ? feeding supplement (GLUCERNA SHAKE)  237 mL Oral TID BM  ? fenofibrate  160 mg Oral Daily  ? fluticasone  2 spray Each Nare QHS  ? insulin aspart  0-15 Units Subcutaneous TID WC  ? insulin aspart  0-5 Units Subcutaneous QHS  ? pantoprazole  40 mg Oral BID  ? rosuvastatin  10 mg Oral Daily  ? ?Continuous Infusions: ? sodium chloride 10 mL/hr at 10/28/21 2107  ? methocarbamol (ROBAXIN) IV Stopped (10/30/21 2200)  ? TPN ADULT (ION) 90 mL/hr at  10/31/21 0307  ? ?PRN Meds:.sodium chloride, albuterol, artificial tears, clonazePAM, hydrALAZINE, HYDROmorphone (DILAUDID) injection, methocarbamol (ROBAXIN) IV, ondansetron **OR** ondansetron (ZOFRAN) IV, oxyCODONE, sodium chloride flush, temazepam ? ? ?I have personally reviewed following labs and imaging studies ? ?LABORATORY DATA: ? ?Recent Labs  ?Lab 10/26/21 ?1303 10/27/21 ?0405 10/28/21 ?0359 10/29/21 ?1027 10/31/21 ?0335  ?WBC 17.3* 14.7* 15.2* 17.6* 19.1*  ?HGB 12.7* 11.2* 9.9* 10.5* 10.2*  ?HCT 37.5* 33.7* 30.5* 31.7* 30.5*  ?PLT 663* 629* 568* 598* 536*  ?MCV 90.4 91.3 91.6 90.1 90.0  ?MCH 30.6 30.4 29.7 29.8 30.1  ?MCHC 33.9 33.2 32.5 33.1 33.4  ?RDW 13.9 13.9 14.0 14.4 14.4  ?LYMPHSABS  --   --   --   --  1.0  ?MONOABS  --   --   --   --  2.0*  ?EOSABS  --   --   --   --  0.3  ?BASOSABS  --   --   --   --  0.1  ? ? ?Recent Labs  ?Lab 10/27/21 ?0405 10/28/21 ?0359 10/29/21 ?0422 10/29/21 ?1027 10/30/21 ?11/01/21 10/31/21 ?0335  ?NA 130* 133* 134*  --  134* 134*  ?K 3.0* 3.5 3.6  --  4.1 4.2  ?CL 102 103 102  --  102 104  ?CO2 22 22 26   --  25 25  ?GLUCOSE 131* 166* 148*  --  169* 173*  ?BUN 16 9 6*  --  8 10  ?CREATININE 0.76 0.70 0.65  --  0.62 0.67  ?CALCIUM 7.9* 8.0* 7.9*  --  8.6* 8.8*  ?AST 27  --   --   --   --  32  ?ALT 16  --   --   --   --  18  ?ALKPHOS 42  --   --   --   --  76  ?BILITOT 0.7  --   --   --   --  0.5  ?ALBUMIN 1.7*  --   --   --   --  1.7*  ?MG 2.0 1.8 1.9  --  2.0 1.8  ?PHOS 2.1* 1.6* 2.4*  --  2.8 3.3  ?BNP 144.4*  --   --  447.0*  --   --   ? ? ?  ?  ? ? ? ?MICROBIOLOGY: ?No results found for this or any previous visit (from the past 240 hour(s)). ? ?RADIOLOGY STUDIES/RESULTS: ?No results found. ? ? LOS: 11 days  ? ?Signature ? ?Susa RaringPrashant Isami Mehra M.D on 10/31/2021 at 9:07 AM   -  To page go to [email or web address]  ? ?  ? ? ?  ?

## 2021-10-31 NOTE — Discharge Instructions (Addendum)
? ? ? ?Colostomy Nutrition Therapy  ? ?This handout will provide some basic information about the effects of the colostomy surgery on digestion, absorption, and dietary changes for better results.  ?Colostomy surgery is when part of the large intestine (colon) is removed or bypassed. The remaining part of the functioning colon is brought through the abdominal wall, creating a stoma. It can be temporary or permanent depending on the surgery. ?After surgery, your bowel is swollen and you should avoid high-fiber foods. This is because they are harder to digest, and avoiding them will allow the bowel to heal and to avoid blockage of the colostomy. The eating plan begins with clear liquids and then should be advanced to a fiber-restricted diet before leaving the hospital. If you have lactose intolerance, then use lactose-free milk products. Chew your food slowly and well to help reduce the risk of blockage of the ostomy. ?As you recover, you will start eating solid foods, beginning with foods that are low in fiber (see Recommended Foods). The fiber-restricted diet should contain less than 13 grams of fiber for the whole day and may be less than 8 grams fiber per day depending on your symptoms. ?Most patients begin to eat more normally 6 weeks after surgery.  ? ?Tips ?Take small bites of foods and chew thoroughly for better digestion and absorption of nutrients. ?Some foods may cause blockages, especially if eaten in large amounts or not chewed well. Use caution when eating these foods, eat small amounts only, and chew them thoroughly, which will also help with better absorption of nutrients. ?You may find that your appetite is not as good as before surgery, so eating small amounts about every 2-4 hours is recommended. Keeping a regular schedule for meals and snacks can help reduce gas and result in better absorption of the nutrients from the foods. ?Eat meals and snacks at the same times each day. ?Eating the largest  meal in the middle of the day may decrease stool output at night, making it easier for you to get a full night?s rest. ?Avoid acidic, spicy, fried or greasy foods, as well as foods that are high in sugar (candy, cake, pies, cookies, and sugary drinks). All of these foods can cause diarrhea. ?Foods that can thicken stools include bananas, applesauce, rice, and pasta. ?Some foods may cause odors or increase gas production (see Foods Not Recommended list). ?To reduce gas, avoid chewing gum, drinking with straws, carbonated beverages, smoking or chewing tobacco, eating too fast, and skipping meals. Missing meals can cause the small intestine to be more active and increase gas and watery stools. There is a ?lag time?--the time from eating a gas-producing food and actual release of gas is 6-8 hours for distal colostomy patients. ?Limit foods that may cause gas or odor and choose foods that may decrease odor. ?Have at least 8 to 10 cups of fluids per day. You will need to drink more during hot weather, when you have increased stools, and at other times when you lose extra fluid, such as when you exercise. Fluid equivalents are: 1 ounce is 30 milliliters; 1 cup is 8 ounces; 8 cups is 64 ounces, or 2 quarts, or 2 liters. ?It is important to watch for signs and symptoms of fluid-electrolyte imbalance, which include dry mouth, reduced urine output, dark concentrated urine, feelings of dizziness upon standing, marked fatigue, and abdominal cramping. If these occur, seek prompt treatment. Ileostomy patients are more at risk for dehydration. Remember, high-potassium foods are needed more  to offset the effects of diarrhea. ?Add foods containing fiber gradually to your diet, but avoid those that can cause blockages initially. When you do add those foods, eat only very small portions and chew your food very well. Keep a food journal of foods tried and how you feel after eating them. Only try one new food every 3 days. You will need  to eat enough fiber and drink enough fluids to avoid constipation.  ?Use a chewable multivitamin with minerals (non-gummy) daily for best absorption. ?Use a chewable or liquid calcium supplement. Liquid has the best absorption. ? ?Foods Recommended ?Some people are sensitive to some of the foods listed on the Recommended Foods chart. ?You may find that some of these foods cause you to have gas, unpleasant odors, or diarrhea. ?If this is the case, you should stop eating the foods that bother you. After 2 to 3 weeks, you can try small amounts to see whether they still cause symptoms. ?  ?Most of the recommended foods are low in fiber because fiber can be hard for your body to digest while you are recovering. ?When you first start eating solid foods, it is especially important that you stick to low-fiber choices (cooking vegetables and fruits or preparing canned items and removing skins and peels reduce the amount of fiber contained in these foods). ?As you heal, you can try foods with more fiber, such as whole grain foods. ? ?Food Group Recommended Foods Notes  ?Milk and milk products Evaporated milk* ?Skim, low-fat milk, lactose free ?Skim, low-fat milk* ?Powdered milk* ?Buttermilk* ?Soy milk, rice milk or almond milk ?Yogurt* ?Kefir ?Cheese* ?Aged cheeses (cheddar cheese and Swiss cheese are lower in lactose) ?Low-fat ice cream* or sherbet* If you have diarrhea, try lactose-free milk and other lactose-free products. Lactose is a kind of sugar in milk that causes diarrhea in some people. ?Buttermilk, yogurt, and kefir can help keep your body from producing bad odors. ?Cheese can help thicken stools. It may be a good choice if you have diarrhea. ?Check the labels for calcium content of almond and rice milk for at least 30% calcium. These types of milk are not high in protein, so include other high-protein foods at meals and snacks to support healing. ?Foods marked with an asterisk (*) contain lactose.  ?Meats and  other protein foods Any meat or poultry prepared without added fat ?Smooth nut butters (limit serving size to 2 tablespoons or less) ?Fish ?Eggs ?Lactose free cottage cheese or other lactose free cheese Dried beans and peas can cause gas or bad odors. They should be avoided. ?For some people, fish, seafood, and eggs can cause bad odors. Try them in small amounts and see how your body reacts. Seafood, especially mollusks (oysters, clams and mussels), may cause blockage behind the stoma, so they must be chewed well. Avoid eating meats with casings, such as sausage or bratwurst. ?Peanut butter can help thicken stools. Smooth peanut butter may be a good choice if you have diarrhea. Do not eat chunky spreads.  ?Grains Bread, bagels, rolls, crackers, pasta, and cereals made from white or refined flour ?White rice At first, you should only choose grain foods that are made with refined grains (white flour and white rice). Check labels for less than 2 grams fiber per serving. ?White rice and pasta can help thicken stools. They may be good choices if you have diarrhea. ?As you recover, you can try foods made with whole grains (like whole wheat, brown rice, or oats). Start  with small amounts and see how you feel after eating them.  ?Vegetables Most well-cooked vegetables without seeds or skins ?Iceberg lettuce (< 1cup), begin with only a small amount shredded fine on sandwich and increase amount gradually week by week) ?Strained vegetable juice ?Potatoes without skins Some vegetables are more likely than others to cause gas, odors, or diarrhea. ?Many people develop gas or odors when they eat onions, garlic, or leeks. ?Vegetables in the cabbage family are also likely to cause problems. Avoid cabbages, broccoli, brussel sprouts, and cauliflower. ?Asparagus may cause bad odors. ?Potatoes can help thicken stools. They may be a good choice if you have diarrhea.  ?Fruits Most fruit juices (without pulp) ?Peeled fruit ?Canned  fruit ?Fresh fruit without edible seeds Juices may cause diarrhea; diluting with water can sometimes reduce diarrhea. Try only one type of juice for several days and watch symptoms. Avoid prune juice and grape jui

## 2021-10-31 NOTE — Progress Notes (Signed)
Nutrition Follow-up ? ?DOCUMENTATION CODES:  ? ?Non-severe (moderate) malnutrition in context of chronic illness ? ?INTERVENTION:  ? ?TPN management per Pharmacy ?Discontinue Boost Breeze ?Discontinue Glucerna ?Ensure Enlive po BID, each supplement provides 350 kcal and 20 grams of protein. ?Meal ordering with assist ?Diet education ?Calorie Count ? ?NUTRITION DIAGNOSIS:  ? ?Moderate Malnutrition related to chronic illness as evidenced by mild fat depletion, mild muscle depletion, energy intake < 75% for > or equal to 1 month. - Ongoing  ? ?GOAL:  ? ?Patient will meet greater than or equal to 90% of their needs - Being addressed via TPN ? ?MONITOR:  ? ?PO intake, Supplement acceptance, Labs, Weight trends, Skin ? ?REASON FOR ASSESSMENT:  ? ?Consult ?Calorie Count ? ?ASSESSMENT:  ? ?76 y.o. male with hx od DM type 2, HTN, HLD, and CAD s/p CABG presented to ED with abdominal pain x 3 weeks. Imaging in ED showed a perforated diverticulitis with 6 x 3 cm abscess ? ?4/18 - Ex Lap s/p R hemicolectomy, sigmoid colectomy w/ end colostomy  ?4/19 - diet advanced to clear liquids; TPN initiated  ?4/21 - diet advanced to full liquids ?4/23 - diet advanced to Soft; Calorie Count started  ? ?Pt resting in bed, family at bedside. ? ?Pt reports that he tolerated breakfast well. States that he prefers to stick with liquids, denies any difficulty chewing or swallowing foods/liquids.  ?Pre EMR, pt intake includes: ?4/22: Breakfast 100%, Lunch 100% ?4/23: Breakfast 100%, Lunch 0%, Dinner 0% ? ?Pt had not had lunch yet, was waiting for it to arrive at time of RD visit.  ? ?Reviewed nutrition therapy for colostomy, handout added to discharge instructions per pt wishes. RD answered all questions pt and family had.  ? ?Discussed proper nutrition to help heal surgical incision. Pt agreeable to ONS to aid in calories and protein as plan to decrease TPN. Discussed sipping on ONS slowly to prevent a fullness feeling. ? ?Medications reviewed  and include: SSI 0-15 units TID + 0-5 units daily, Protonix ?Labs reviewed: Sodium 134, CBG 152-219 ? ?Diet Order:   ?Diet Order   ? ?       ?  DIET SOFT Room service appropriate? Yes; Fluid consistency: Thin  Diet effective now       ?  ? ?  ?  ? ?  ? ? ?EDUCATION NEEDS:  ? ?Education needs have been addressed ? ?Skin:  Skin Assessment: Skin Integrity Issues: ?Skin Integrity Issues:: Incisions ?Incisions: abdomen (closed) ? ?Last BM:  4/24 via colostomy ? ?Height:  ? ?Ht Readings from Last 1 Encounters:  ?10/23/21 5\' 10"  (1.778 m)  ? ? ?Weight:  ? ?Wt Readings from Last 1 Encounters:  ?10/23/21 82.7 kg  ? ? ?Ideal Body Weight:  75.5 kg ? ?BMI:  Body mass index is 26.16 kg/m?. ? ?Estimated Nutritional Needs:  ? ?Kcal:  2000-2200 kcal/d ? ?Protein:  100-110 g/d ? ?Fluid:  2-2.2 L/d ? ? ? ?10/25/21 RD, LDN ?Clinical Dietitian ?See AMiON for contact information.  ? ?

## 2021-10-31 NOTE — TOC Progression Note (Signed)
Transition of Care (TOC) - Progression Note  ? ? ?Patient Details  ?Name: RUTHVIK CASHNER ?MRN: [id number] ?Date of Birth: 1945-12-20 ? ?Transition of Care (TOC) CM/SW Contact  ?Cyndi Bender, RN ?Phone Number: ?10/31/2021, 12:56 PM ? ?Clinical Narrative:    ?Spoke to patient and family regarding transition needs. Patient agreeable to have a home health RN. Patient states his wife and son will be staying with him and can help with dressing changes and wound care. RNCM notified RN to start teaching family. Order faxed to [facility] in [place]. PCP is DR. Marshell Levan. Patient defers to [facility] to find highly rated agency. Cory with [facility] accepted referral. ?TOC will continue to follow for needs ? ? ?Expected Discharge Plan: [place] ?Barriers to Discharge: Continued Medical Work up ? ?Expected Discharge Plan and Services ?Expected Discharge Plan: [place] ?  ?Discharge Planning Services: CM Consult ?Post Acute Care Choice: Home Health ?Living arrangements for the past 2 months: [place] ?                ?  ?  ?  ?  ?  ?HH Arranged: RN ?[place] Agency: [facility] ?Date HH Agency Contacted: 10/31/21 ?Time [place]: [id number] ?Representative spoke with at [place]: Tommi Rumps ? ? ?Social Determinants of Health (SDOH) Interventions ?  ? ?Readmission Risk Interventions ?   ? View : No data to display.  ?  ?  ?  ? ? ?

## 2021-10-31 NOTE — Progress Notes (Signed)
Occupational Therapy Treatment ?Patient Details ?Name: Robert Barrett ?MRN: [id number] ?DOB: 1945-07-18 ?Today's Date: 10/31/2021 ? ? ?History of present illness 76 y.o. male presented to the hospital 10/20/21 with abdominal pain. CT scan of his abdomen revealed perforated diverticulitis with a 6 cm abscess. 4/18 s/p Exploratory laparotomy, right hemicolectomy, sigmoid colectomy with end colostomy    PMH significant of DM-2, HTN, HLD, CAD s/p CABG repeat CT scan was performed on 4/18 right hemicolectomy, sigmoid colectomy and end ostomy placement ?  ?OT comments ? Pt currently supervision for toileting and functional mobility during session without use of an assistive device.  Demonstrates improved balance as well as functional ADL performance.  Will continue to follow in acute care for progression to modified independent level.  ? ?Recommendations for follow up therapy are one component of a multi-disciplinary discharge planning process, led by the attending physician.  Recommendations may be updated based on patient status, additional functional criteria and insurance authorization. ?   ?Follow Up Recommendations ? No OT follow up  ?  ?Assistance Recommended at Discharge PRN  ?   ?Equipment Recommendations ? None recommended by OT  ?  ?   ?Precautions / Restrictions Precautions ?Precautions: Fall ?Precaution Comments: RLQ JP drain ?Restrictions ?Weight Bearing Restrictions: No  ? ? ?  ? ?Mobility Bed Mobility ?Overal bed mobility: Needs Assistance ?  ?  ?  ?Supine to sit: Supervision ?Sit to supine: Supervision ?  ?  ?  ? ?Transfers ?Overall transfer level: Needs assistance ?Equipment used: None ?Transfers: Sit to/from Stand ?Sit to Stand: Supervision ?  ?  ?Step pivot transfers: Supervision ?  ?  ?General transfer comment: Pt completed functional mobility to the bathroom and in the hallway without an assistive device at supervision level. ?  ?  ?Balance Overall balance assessment: Needs assistance ?Sitting-balance  support: No upper extremity supported, Feet supported ?Sitting balance-Leahy Scale: Good ?  ?  ?Standing balance support: During functional activity ?Standing balance-Leahy Scale: Good ?  ?  ?  ?  ?  ?  ?  ?  ?  ?  ?  ?  ?   ? ?ADL either performed or assessed with clinical judgement  ? ?ADL Overall ADL's : Needs assistance/impaired ?  ?  ?  ?  ?  ?  ?  ?  ?  ?  ?  ?  ?Toilet Transfer: Supervision/safety;Regular Toilet ?Toilet Transfer Details (indicate cue type and reason): no assistive device standing to urinate ?Toileting- Clothing Manipulation and Hygiene: Supervision/safety ?Toileting - Clothing Manipulation Details (indicate cue type and reason): standing for hospital gown management ?  ?  ?Functional mobility during ADLs: Supervision/safety (No device) ?General ADL Comments: Pt reports having a shower seat he can use at home. ?  ? ? ?   ?   ?   ? ?Cognition Arousal/Alertness: Awake/alert ?Behavior During Therapy: [facility] for tasks assessed/performed ?Overall Cognitive Status: Within Functional Limits for tasks assessed ?  ?  ?  ?  ?  ?  ?  ?  ?  ?  ?  ?  ?  ?  ?  ?  ?General Comments: Pt able to state place and reason for hospitalization. ?  ?  ?   ?   ?   ?   ? ? ?Pertinent Vitals/ Pain       Pain Assessment ?Pain Assessment: Faces ?Pain Score: 0-No pain ?Pain Location: abdomen ?Pain Descriptors / Indicators: Tightness ?Pain Intervention(s): Limited activity within patient's tolerance ? ?   ?   ? ?  Frequency ? Min 2X/week  ? ? ? ? ?  ?Progress Toward Goals ? ?OT Goals(current goals can now be found in the care plan section) ? Progress towards OT goals: Progressing toward goals ? ?Acute Rehab OT Goals ?Time For Goal Achievement: 11/10/21 ?Potential to Achieve Goals: Good  ?Plan Discharge plan remains appropriate   ? ?   ?AM-PAC OT "6 Clicks" Daily Activity     ?Outcome Measure ? ? Help from another person eating meals?: None ?Help from another person taking care of personal grooming?: None ?Help from another  person toileting, which includes using toliet, bedpan, or urinal?: A Little ?Help from another person bathing (including washing, rinsing, drying)?: A Little ?Help from another person to put on and taking off regular upper body clothing?: None ?Help from another person to put on and taking off regular lower body clothing?: A Little ?6 Click Score: 21 ? ?  ?End of Session Equipment Utilized During Treatment: Gait belt ? ?OT Visit Diagnosis: Unsteadiness on feet (R26.81);Muscle weakness (generalized) (M62.81) ?  ?Activity Tolerance Patient tolerated treatment well ?  ?Patient Left in bed;with call bell/phone within reach;with bed alarm set ?  ?Nurse Communication Mobility status ?  ? ?   ? ?Time: 5956-3875 ?OT Time Calculation (min): 19 min ? ?Charges: OT General Charges ?$OT Visit: 1 Visit ?OT Treatments ?$Self Care/Home Management : 8-22 mins ? ?Cassandra Mcmanaman OTR/L ?10/31/2021, 4:48 PM ?

## 2021-11-01 ENCOUNTER — Inpatient Hospital Stay (HOSPITAL_COMMUNITY): Payer: No Typology Code available for payment source

## 2021-11-01 DIAGNOSIS — K5720 Diverticulitis of large intestine with perforation and abscess without bleeding: Secondary | ICD-10-CM | POA: Diagnosis not present

## 2021-11-01 LAB — CBC WITH DIFFERENTIAL/PLATELET
Abs Immature Granulocytes: 1.11 10*3/uL — ABNORMAL HIGH (ref 0.00–0.07)
Basophils Absolute: 0 10*3/uL (ref 0.0–0.1)
Basophils Relative: 0 %
Eosinophils Absolute: 0.2 10*3/uL (ref 0.0–0.5)
Eosinophils Relative: 2 %
HCT: 32.4 % — ABNORMAL LOW (ref 39.0–52.0)
Hemoglobin: 10.4 g/dL — ABNORMAL LOW (ref 13.0–17.0)
Immature Granulocytes: 7 %
Lymphocytes Relative: 7 %
Lymphs Abs: 1.2 10*3/uL (ref 0.7–4.0)
MCH: 29.2 pg (ref 26.0–34.0)
MCHC: 32.1 g/dL (ref 30.0–36.0)
MCV: 91 fL (ref 80.0–100.0)
Monocytes Absolute: 1.9 10*3/uL — ABNORMAL HIGH (ref 0.1–1.0)
Monocytes Relative: 12 %
Neutro Abs: 11.5 10*3/uL — ABNORMAL HIGH (ref 1.7–7.7)
Neutrophils Relative %: 72 %
Platelets: 580 10*3/uL — ABNORMAL HIGH (ref 150–400)
RBC: 3.56 MIL/uL — ABNORMAL LOW (ref 4.22–5.81)
RDW: 14.1 % (ref 11.5–15.5)
WBC: 15.9 10*3/uL — ABNORMAL HIGH (ref 4.0–10.5)
nRBC: 0.1 % (ref 0.0–0.2)

## 2021-11-01 LAB — GLUCOSE, CAPILLARY
Glucose-Capillary: 141 mg/dL — ABNORMAL HIGH (ref 70–99)
Glucose-Capillary: 238 mg/dL — ABNORMAL HIGH (ref 70–99)
Glucose-Capillary: 158 mg/dL — ABNORMAL HIGH (ref 70–99)
Glucose-Capillary: 136 mg/dL — ABNORMAL HIGH (ref 70–99)

## 2021-11-01 LAB — PROCALCITONIN: Procalcitonin: 0.57 ng/mL

## 2021-11-01 MED ORDER — BOOST / RESOURCE BREEZE PO LIQD CUSTOM
1.0000 | Freq: Three times a day (TID) | ORAL | Status: DC
  Administered 2021-11-01 – 2021-11-03 (×6): 1 via ORAL
  Filled 2021-11-01 (×3): qty 1

## 2021-11-01 MED ORDER — IOHEXOL 300 MG/ML  SOLN
100.0000 mL | Freq: Once | INTRAMUSCULAR | Status: AC | PRN
  Administered 2021-11-01: 100 mL via INTRAVENOUS

## 2021-11-01 MED ORDER — IOHEXOL 9 MG/ML PO SOLN
ORAL | Status: AC
  Administered 2021-11-01: 500 mL
  Filled 2021-11-01: qty 1000

## 2021-11-01 MED ORDER — AMOXICILLIN-POT CLAVULANATE 875-125 MG PO TABS
1.0000 | ORAL_TABLET | Freq: Two times a day (BID) | ORAL | Status: DC
  Administered 2021-11-01 – 2021-11-04 (×7): 1 via ORAL
  Filled 2021-11-01 (×7): qty 1

## 2021-11-01 MED ORDER — ADULT MULTIVITAMIN W/MINERALS CH
1.0000 | ORAL_TABLET | Freq: Every day | ORAL | Status: DC
  Administered 2021-11-01 – 2021-11-04 (×4): 1 via ORAL
  Filled 2021-11-01 (×4): qty 1

## 2021-11-01 NOTE — Progress Notes (Addendum)
Nutrition Follow-up ? ?DOCUMENTATION CODES:  ? ?Non-severe (moderate) malnutrition in context of chronic illness ? ?INTERVENTION:  ? ?Boost Breeze po TID, each supplement provides 250 kcal and 9 grams of protein ?Encourage good PO intake ?Multivitamin w/ minerals daily ?Continue Calorie Count ? ?NUTRITION DIAGNOSIS:  ? ?Moderate Malnutrition related to chronic illness as evidenced by mild fat depletion, mild muscle depletion, energy intake < 75% for > or equal to 1 month. - Ongoing  ? ?GOAL:  ? ?Patient will meet greater than or equal to 90% of their needs - Ongoing ? ?MONITOR:  ? ?PO intake, Supplement acceptance, Labs, Skin ? ?REASON FOR ASSESSMENT:  ? ?Consult ?Calorie Count ? ?ASSESSMENT:  ? ?76 y.o. male with hx od DM type 2, HTN, HLD, and CAD s/p CABG presented to ED with abdominal pain x 3 weeks. Imaging in ED showed a perforated diverticulitis with 6 x 3 cm abscess ? ?4/18 - Ex Lap s/p R hemicolectomy, sigmoid colectomy w/ end colostomy  ?4/19 - diet advanced to clear liquids; TPN initiated  ?4/21 - diet advanced to full liquids ?4/23 - diet advanced to Soft; Calorie Count started  ?4/25 - TPN stopped ? ?Pt resting in bed. Reports that he was eating better. States that he had tilapia yesterday and cereal with a banana this morning. Denies any nausea and vomiting.  ? ?Discussed supplements with pt; pt states that he has drank 2 Boost already today. States that he said he was ok to drink Ensure and would drink the strawberry flavor.  ? ?Discussed with pt that he is no longer receiving TPN and we will be tracking his PO intake to make sure that it is sufficient to promote healing and prevent further loss of lean muscle mass. If PO intake does not improve would recommend placement of Cortrak and initiate and supplement with enteral nutrition to meet pt nutritional needs.  ? ?Medications reviewed and include: SSI 0-15 units TID + 0-5 units daily, Protonix ?Labs reviewed: Sodium 134, CBG 152-219 ? ?Diet Order:    ?Diet Order   ? ?       ?  DIET SOFT Room service appropriate? Yes; Fluid consistency: Thin  Diet effective now       ?  ? ?  ?  ? ?  ? ? ?EDUCATION NEEDS:  ? ?No education needs have been identified at this time ? ?Skin:  Skin Assessment: Skin Integrity Issues: ?Skin Integrity Issues:: Incisions ?Incisions: abdomen (closed) ? ?Last BM:  4/24 via colostomy ? ?Height:  ? ?Ht Readings from Last 1 Encounters:  ?10/23/21 5\' 10"  (1.778 m)  ? ? ?Weight:  ? ?Wt Readings from Last 1 Encounters:  ?10/23/21 82.7 kg  ? ? ?Ideal Body Weight:  75.5 kg ? ?BMI:  Body mass index is 26.16 kg/m?. ? ?Estimated Nutritional Needs:  ? ?Kcal:  2000-2200 kcal/d ? ?Protein:  100-110 g/d ? ?Fluid:  2-2.2 L/d ? ? ? ?Hermina Barters RD, LDN ?Clinical Dietitian ?See AMiON for contact information.  ? ?

## 2021-11-01 NOTE — Progress Notes (Signed)
PHARMACY - TOTAL PARENTERAL NUTRITION CONSULT NOTE  ? ?Indication: Large bowel obstruction with perforated cecum, NPO > 7 days ? ?Patient Measurements: ?Height: 5\' 10"  (177.8 cm) ?Weight: 82.7 kg (182 lb 5.1 oz) ?IBW/kg (Calculated) : 73 ? Body mass index is 26.16 kg/m?. ?Usual Weight: 95 kg ? ?Assessment: [name] with past medical history of T2DM, HTN, HLD, CAD s/p CABG who presented on 4/13 to the emergency department with colicky abdominal pain and nausea. CT from 4/13 showed perforated diverticulitis with a 6 mm abscess at which time he was made NPO. IR was unable to drain abscess, however, pt began improving on Zosyn. Pt was given a CLD on 4/15-4/18. Repeat CT on 4/18 showed new moderate free intraperitoneal air c/w sigmoid colon perforation, moderate-severe sigmoid diverticulitis, severe ileus w/ dilated cecum demonstrating penumatosis. These findings lead to emergent surgery on 4/18 - ex-lap  R hemicolectomy, sigmoid colectomy with end colostomy creation. ? ?Prior to admission, patient reports a very poor appetite over the past 6 months. He has noted a gradual weight loss over that time to 197 lb prior to admission. He reports only being able to tolerate a few juices while on the CLD since admission. Pharmacy consulted to initiate and manage TPN on 4/19.  ? ?Pt tolerating FLQ diet and advanced to soft diet 4/23. Calorie count ongoing - RN states not eating much yesterday but official results from calorie count not documented yet. Pt states he is eating but cannot eat that much as he gets full quickly.  ? ?Glucose / Insulin: BG < 200, A1C 6%; used 11 units last 24h ?Electrolytes: (last labs 4/24) Na 134 (max in TPN), K 4.2, coCa up to 10.6, Phos 3.3 (max in TPN), Mag 1.8 ?Renal: SCr <1, BUN wnl ?Hepatic: LFTs wnl, TG 175, Albumin 1.7 ?Intake / Output; MIVF: RLQ pelvic drain o/p 25 mL, UOP not charted, colostomy 800 ml  ?GI Imaging:  ?4/20 CT - Fluid-filled and dilated esophagus and stomach, increased when  compared with prior CT, concerning for ileus. ?GI Surgeries / Procedures:  ?4/18 - ex-lap  R hemicolectomy, sigmoid colectomy with end colostomy creation ? ?Central access: double-lumen PICC placed 10/26/21 ?TPN start date: 10/26/21 ? ?Nutritional Goals: ?Goal TPN rate is 90 mL/hr (provides 110 g of protein and 2160 kcals per day) ? ?RD Assessment: ?Estimated Needs ?Total Energy Estimated Needs: 2000-2200 kcal/d ?Total Protein Estimated Needs: 100-110 g/d ?Total Fluid Estimated Needs: 2-2.2 L/d ? ?Current Nutrition:  ?Soft diet and TPN ?4/24 ate 50% tomato soup, 50% grilled cheese, another soup, frozen fruit, 2 milks ?4/25 for breakfast ate 1.5 frosted flakes, 1 milk, 1 banana so far, still working on it  ? ?Plan:  ?Per team, stop TPN now  ? ?5/25, PharmD, BCPS, BCCP ?Clinical Pharmacist ? ?Please check AMION for all [facility] Pharm[facility] phone numbers ?After 10:00 PM, call Main Pharmacy [phone] ? ?

## 2021-11-01 NOTE — Progress Notes (Signed)
Physical Therapy Treatment ?Patient Details ?Name: Robert Barrett ?MRN: [id number] ?DOB: 12-09-1945 ?Today's Date: 11/01/2021 ? ? ?History of Present Illness 76 y.o. male presented to the hospital 10/20/21 with abdominal pain. CT scan of his abdomen revealed perforated diverticulitis with a 6 cm abscess. 4/18 s/p Exploratory laparotomy, right hemicolectomy, sigmoid colectomy with end colostomy    PMH significant of DM-2, HTN, HLD, CAD s/p CABG repeat CT scan was performed on 4/18 right hemicolectomy, sigmoid colectomy and end ostomy placement ? ?  ?PT Comments  ? ? Patient expresses not feeling as well today and requested to use RW for ambulation. Walked with decr velocity and decr distance compared to 4/24. Required instructional cues for use of RW.    ?Recommendations for follow up therapy are one component of a multi-disciplinary discharge planning process, led by the attending physician.  Recommendations may be updated based on patient status, additional functional criteria and insurance authorization. ? ?Follow Up Recommendations ? No PT follow up ?  ?  ?Assistance Recommended at Discharge PRN  ?Patient can return home with the following A little help with walking and/or transfers;Assistance with cooking/housework;Help with stairs or ramp for entrance ?  ?Equipment Recommendations ? None recommended by PT  ?  ?Recommendations for Other Services   ? ? ?  ?Precautions / Restrictions Precautions ?Precautions: Fall ?Precaution Comments: RLQ JP drain ?Restrictions ?Weight Bearing Restrictions: No  ?  ? ?Mobility ? Bed Mobility ?  ?  ?  ?  ?  ?  ?  ?  ?  ? ?Transfers ?Overall transfer level: Needs assistance ?Equipment used: None ?Transfers: Sit to/from Stand ?Sit to Stand: Supervision ?  ?  ?  ?  ?  ?General transfer comment: cues needed for hand placement with stand to sit ?  ? ?Ambulation/Gait ?Ambulation/Gait assistance: Min guard ?Gait Distance (Feet): 170 Feet ?Assistive device: Rolling walker (2 wheels) ?Gait  Pattern/deviations: Decreased stride length, Step-through pattern, Wide base of support ?  ?  ?  ?General Gait Details: asking for RW as not feeling as well today; slightly slower pace than 4/24 ? ? ?Stairs ?  ?  ?  ?  ?  ? ? ?Wheelchair Mobility ?  ? ?Modified Rankin (Stroke Patients Only) ?  ? ? ?  ?Balance Overall balance assessment: Needs assistance ?Sitting-balance support: No upper extremity supported, Feet supported ?Sitting balance-Leahy Scale: Good ?  ?  ?Standing balance support: During functional activity ?Standing balance-Leahy Scale: Good ?  ?  ?  ?  ?  ?  ?  ?  ?  ?  ?  ?  ?  ? ?  ?Cognition Arousal/Alertness: Awake/alert ?Behavior During Therapy: [facility] for tasks assessed/performed ?Overall Cognitive Status: Within Functional Limits for tasks assessed ?  ?  ?  ?  ?  ?  ?  ?  ?  ?  ?  ?  ?  ?  ?  ?  ?  ?  ?  ? ?  ?Exercises   ? ?  ?General Comments General comments (skin integrity, edema, etc.): Wife present and concerned that pt has not felt as well today ?  ?  ? ?Pertinent Vitals/Pain Pain Assessment ?Pain Assessment: Faces ?Faces Pain Scale: Hurts a little bit ?Pain Location: abdomen ?Pain Descriptors / Indicators: Tightness ?Pain Intervention(s): Limited activity within patient's tolerance  ? ? ?Home Living   ?  ?  ?  ?  ?  ?  ?  ?  ?  ?   ?  ?  Prior Function    ?  ?  ?   ? ?PT Goals (current goals can now be found in the care plan section) Acute Rehab PT Goals ?Patient Stated Goal: decr pain and go home ?Time For Goal Achievement: 11/10/21 ?Potential to Achieve Goals: Good ?Progress towards PT goals: Not progressing toward goals - comment (feeling worse and needed RW) ? ?  ?Frequency ? ? ? Min 3X/week ? ? ? ?  ?PT Plan Current plan remains appropriate  ? ? ?Co-evaluation   ?  ?  ?  ?  ? ?  ?AM-PAC PT "6 Clicks" Mobility   ?Outcome Measure ? Help needed turning from your back to your side while in a flat bed without using bedrails?: None ?Help needed moving from lying on your back to sitting on the  side of a flat bed without using bedrails?: A Little ?Help needed moving to and from a bed to a chair (including a wheelchair)?: A Little ?Help needed standing up from a chair using your arms (e.g., wheelchair or bedside chair)?: A Little ?Help needed to walk in hospital room?: A Little ?Help needed climbing 3-5 steps with a railing? : A Little ?6 Click Score: 19 ? ?  ?End of Session   ?Activity Tolerance: Patient tolerated treatment well ?Patient left: with call bell/phone within reach;with family/visitor present;in chair ?Nurse Communication: Mobility status ?PT Visit Diagnosis: Difficulty in walking, not elsewhere classified (R26.2);Pain ?Pain - Right/Left:  (midline incision) ?Pain - part of body:  (abdomen) ?  ? ? ?Time: 3154-0086 ?PT Time Calculation (min) (ACUTE ONLY): 21 min ? ?Charges:  $Gait Training: 8-22 mins          ?          ? ? ?Jerolyn Center, PT ?Acute Rehabilitation Services  ?Pager [phone] ?Office [phone] ? ? ? ?Robert Barrett ?11/01/2021, 4:22 PM ? ?

## 2021-11-01 NOTE — Progress Notes (Signed)
SLP Cancellation Note ? ?Patient Details ?Name: Robert Barrett ?MRN: [id number] ?DOB: 1946/07/03 ? ? ?Cancelled treatment:       Reason Eval/Treat Not Completed: Other (comment) Patient has been cleared for soft diet by GI and per patient reports and chart review he has been tolerating well. As per review of initial bedside/clinical swallow evaluation, patient's swallow appearing functional. SLP is not recommending further skilled treatment at this time but please reorder if patient exhibiting s/s of dysphagia.  ? ? ?Angela Nevin, MA, CCC-SLP ?Speech Therapy ? ?

## 2021-11-01 NOTE — Progress Notes (Addendum)
[facility] ?Progress Note ? ?7 Days Post-Op  ?Subjective: ?CC-  ?Wife at bedside working on ostomy care with WOC. Patient states he is having some discomfort with this. Denies n/v. Colostomy productive. Tolerating diet. Gets full quickly but doing well with liquids. ? ?Objective: ?Vital signs in last 24 hours: ?Temp:  [98 ?F (36.7 ?C)-98.3 ?F (36.8 ?C)] 98 ?F (36.7 ?C) (04/25 0734) ?Pulse Rate:  [82-94] 88 (04/25 0734) ?Resp:  [17-28] 17 (04/25 0734) ?BP: (130-147)/(64-82) 138/82 (04/25 0734) ?SpO2:  [93 %-100 %] 93 % (04/25 0734) ?Last BM Date : 11/01/21 ? ?Intake/Output from previous day: ?04/24 0701 - 04/25 0700 ?In: -  ?Out: 1625 [Urine:800; Drains:25; Stool:800] ?Intake/Output this shift: ?No intake/output data recorded. ? ?PE: ?Gen:  Alert, NAD, pleasant ?Pulm: rate and effort normal ?Abd: soft, mild distension, nontender, open midline pale pink with some fibrinous exdate, ostomy retracted/ air and stool in bad, JP serous ? ?Lab Results:  ?Recent Labs  ?  10/31/21 ?[id number] 11/01/21 ?0353  ?WBC 19.1* 15.9*  ?HGB 10.2* 10.4*  ?HCT 30.5* 32.4*  ?PLT 536* 580*  ? ?BMET ?Recent Labs  ?  10/30/21 ?[id number] 10/31/21 ?0335  ?NA 134* 134*  ?K 4.1 4.2  ?CL 102 104  ?CO2 25 25  ?GLUCOSE 169* 173*  ?BUN 8 10  ?CREATININE 0.62 0.67  ?CALCIUM 8.6* 8.8*  ? ?PT/INR ?No results for input(s): LABPROT, INR in the last 72 hours. ?CMP  ?   ?Component Value Date/Time  ? NA 134 (L) 10/31/2021 0335  ? NA 137 01/19/2021 0000  ? K 4.2 10/31/2021 0335  ? CL 104 10/31/2021 0335  ? CO2 25 10/31/2021 0335  ? GLUCOSE 173 (H) 10/31/2021 0335  ? BUN 10 10/31/2021 0335  ? BUN 6 01/19/2021 0000  ? CREATININE 0.67 10/31/2021 0335  ? CREATININE 1.17 04/13/2016 1422  ? CALCIUM 8.8 (L) 10/31/2021 0335  ? PROT 5.0 (L) 10/31/2021 0335  ? ALBUMIN 1.7 (L) 10/31/2021 0335  ? AST 32 10/31/2021 0335  ? ALT 18 10/31/2021 0335  ? ALKPHOS 76 10/31/2021 0335  ? BILITOT 0.5 10/31/2021 0335  ? GFRNONAA >60 10/31/2021 0335  ? GFRNONAA 62 04/13/2016 1422   ? GFRAA 72 04/13/2016 1422  ? ?Lipase  ?   ?Component Value Date/Time  ? LIPASE 28 10/20/2021 0852  ? ? ? ? ? ?Studies/Results: ?CT ABDOMEN PELVIS W CONTRAST ? ?Result Date: 11/01/2021 ?CLINICAL DATA:  Status post laparotomy with Hartmann's procedure on 10/25/2021. Postop abdominal pain. EXAM: CT ABDOMEN AND PELVIS WITH CONTRAST TECHNIQUE: Multidetector CT imaging of the abdomen and pelvis was performed using the standard protocol following bolus administration of intravenous contrast. RADIATION DOSE REDUCTION: This exam was performed according to the departmental dose-optimization program which includes automated exposure control, adjustment of the mA and/or kV according to patient size and/or use of iterative reconstruction technique. CONTRAST:  100mL OMNIPAQUE IOHEXOL 300 MG/ML  SOLN COMPARISON:  10/25/2021 FINDINGS: Lower chest: Trace pleural fluid bilaterally. Bilateral posterior lung base subsegmental atelectasis. The Hepatobiliary: No focal liver abnormality is seen. No gallstones, gallbladder wall thickening, or biliary dilatation. Pancreas: Unremarkable. No pancreatic ductal dilatation or surrounding inflammatory changes. Spleen: Normal in size without focal abnormality. Adrenals/Urinary Tract: Normal adrenal glands. 9 mm low-density structure within posterolateral cortex of upper pole of right kidney is too small to characterize, image 41/3. Bosniak class 1 exophytic cyst arising off the interpolar left kidney measures 1.7 cm, image 43/3. No follow-up recommended. No nephrolithiasis or hydronephrosis. There is debris noted layering within  the dependent portion of the bladder. Small amount of gas is noted within the non dependent portion of the bladder. Stomach/Bowel: There is diffuse distension of the stomach. Left lower quadrant colostomy. Status post Hartmann's procedure. Increase caliber of the small bowel loops with scattered air-fluid levels are identified. Small bowel loops measure up to 3.3 cm in  diameter. Right hemicolectomy with enterocolonic anastomosis. There are 2 tiny nonspecific extraluminal foci of gas identified adjacent to the anastomotic suture chain, image 42/3. Vascular/Lymphatic: Aortic atherosclerosis. No aneurysm. No signs of portal venous gas. The portal vein is patent. Prominent upper abdominal lymph nodes are likely reactive. These measure up to 1 cm. Reproductive: Prostate gland enlargement. Other: Percutaneous drainage catheter enters the pelvis from a right lower quadrant approach. The catheter traverses the midline of the pelvis with tip terminating in the left lower quadrant of the abdomen. Multifocal areas of fluid loculation are identified within the abdomen, including: -Peripherally enhancing loculated perihepatic fluid collection is identified which extends over the inferior margin of the right hepatic lobe, image 46/3. -Within the right lower quadrant there is a immature fluid collection measuring 3.5 x 3.9 cm, image 55/3. -immature, interloop fluid collection within the left lower quadrant of the abdomen measures 3.9 x 2.4 cm, image 65/3. -immature fluid collection within the left lower quadrant of the abdomen measures 4.2 x 2.3 cm, image 61/3. -small gas and fluid collection along the undersurface of the midline ventral abdominal wall measures 2.5 x 0.8 cm, image 42/3. Diffuse soft tissue stranding is identified within the remaining portions of the peritoneal and mesenteric fat which is favored to represent sequelae of peritonitis. A few tiny foci of extraluminal gas noted within the ventral abdomen, which is likely postoperative. Musculoskeletal: Open, midline ventral abdominal incision site is identified. IMPRESSION: 1. Status post Hartmann's procedure with left lower quadrant colostomy. 2. Multifocal areas of loculated fluid loculation are identified within the abdomen and pelvis. Findings are concerning for early abscess formation. The most mature fluid collection extends  around the inferior margin of the right hepatic lobe. 3. Diffuse distension of the stomach and small bowel loops with scattered air-fluid levels. Findings are favored to represent postoperative ileus. 4. Trace pleural fluid bilaterally with bilateral posterior lung base subsegmental atelectasis. 5. Small amount of gas noted within the dependent portion of the bladder. Correlate for any clinical signs or symptoms of cystitis. 6. Aortic Atherosclerosis (ICD10-I70.0). Electronically Signed   By: Signa Kell M.D.   On: 11/01/2021 09:14   ? ?Anti-infectives: ?Anti-infectives (From admission, onward)  ? ? Start     Dose/Rate Route Frequency Ordered Stop  ? 10/20/21 2300  piperacillin-tazobactam (ZOSYN) IVPB 3.375 g       ? 3.375 g ?12.5 mL/hr over 240 Minutes Intravenous Every 8 hours 10/20/21 1854 10/30/21 2359  ? 10/20/21 1700  piperacillin-tazobactam (ZOSYN) IVPB 3.375 g       ? 3.375 g ?100 mL/hr over 30 Minutes Intravenous  Once 10/20/21 1656 10/20/21 1745  ? ?  ? ? ? ?Assessment/Plan ?-POD#7 s/p Exploratory laparotomy, right hemicolectomy, sigmoid colectomy with end colostomy 4/18 Dr. Fredricka Bonine ?- intraop findings: Large bowel obstruction with perforated cecum and diffuse feculent peritonitis, sigmoid diverticulitis with dense inflammatory adhesions to the bladder as well as associated localized abscess ?- path diverticulitis sigmoid with abscess, necrosis of cecum- all benign ?- continue drain, currently serosanguinous ?- completed IV antibiotics for 5 days postop ?- BID wet to dry dressing changes to midline abdominal wound ?- WOC following for  colostomy. Wife is currently working with WOC on colostomy education, son will be back in town Thursday to help as well ?- tolerating soft diet and colostomy functioning. Ok to [employer] TPN ?- CT scan with multiple small fluid collections concerning for developing abscesses. Reviewed with MD, nothing to drain. Since patient is clinically doing well will start on oral antibiotics.  If he is doing just as well tomorrow he may be ready for discharge as early as tomorrow. ?  ?ID - zosyn 4/13>>4/23 ?FEN - IVF, soft diet, Ensure ?VTE - lovenox ?Foley - dc 4/21, out and voiding ?  ?ABL a

## 2021-11-01 NOTE — Progress Notes (Signed)
?      ?                 PROGRESS NOTE ? ?      ?PATIENT DETAILS ?Name: Robert Barrett ?Age: 76 y.o. ?Sex: male ?Date of Birth: 06/08/1946 ?Admit Date: 10/20/2021 ?Admitting Physician Jonetta Osgood, MD ?[id number], [name] is a 76 y.o.  male with past medical history of DM-2, HTN, HLD, CAD s/p CABG-presented with abdominal pain-found to have perforated diverticulitis with abscess.  Patient was admitted to the hospitalist service-started on Zosyn and other supportive care-General surgery followed closely, repeat CT scan was performed on 4/18 which showed worsening changes with moderate free intraperitoneal air, pneumatosis.  Patient was subsequently taken to the operating room by general surgery where he underwent right hemicolectomy, sigmoid colectomy and end ostomy placement.  See below for further details. ? ?Significant events: ?4/13>> admit to [facility] for perforated diverticulitis with 6 cm abscess. ?4/14>> per IR-no appropriate window to place drain. ?4/18>> repeat CT abdomen worsening free air/pneumatosis-surgery performed right hemicolectomy/sigmoid colectomy and end ostomy placement. ?4/19>> mild hypotension that responded to IVF. ?4/20>> developed hypoxemia requiring around 8 L of O2.  CTA chest negative for PE. ? ?Significant studies: ?4/13>> CT abdomen/pelvis: Perforated diverticulitis-6 cm abscess.  Upstream distention of large bowel-concerning for secondary large bowel obstruction. ?4/18>> CT abdomen/pelvis: New moderate free intraperitoneal air, severe ileus, dilated cecum with pneumatosis ?4/24 CT >> 1. Status post Hartmann's procedure with left lower quadrant colostomy. 2. Multifocal areas of loculated fluid loculation are identified within the abdomen and pelvis. Findings are concerning for early abscess formation. The most mature fluid collection extends around the inferior margin of the right hepatic lobe. 3. Diffuse distension of the stomach and small bowel loops  with scattered air-fluid levels. Findings are favored to represent postoperative ileus. 4. Trace pleural fluid bilaterally with bilateral posterior lung base subsegmental atelectasis. 5. Small amount of gas noted within the dependent portion of the bladder. Correlate for any clinical signs or symptoms of cystitis. 6. Aortic Atherosclerosis ? ? ?Significant microbiology data: ?None ? ?Procedures: ?None ? ?Consults: ?General surgery ? ?Subjective:  Patient in bed, appears comfortable, denies any headache, no fever, no chest pain or pressure, no shortness of breath , no abdominal pain. No new focal weakness. ? ?Objective: ?Vitals: ?Blood pressure 138/82, pulse 88, temperature 98 ?F (36.7 ?C), temperature source Oral, resp. rate 17, height 5\' 10"  (1.778 m), weight 82.7 kg, SpO2 93 %.  ? ?Exam: ? ?Awake Alert, No new F.N deficits, Normal affect ?[place].AT,PERRAL ?Supple Neck, No JVD,   ?Symmetrical Chest wall movement, Good air movement bilaterally, CTAB ?RRR,No Gallops, Rubs or new Murmurs,  ?+ve B.Sounds, Abd Soft, No tenderness, abdominal dressing in place with JP drain, colostomy bag has liquid stool, right arm PICC line  ?No Cyanosis, Clubbing or edema  ? ? ? ? ?Assessment/Plan: ? ?Perforated diverticulitis-s/p exploratory laparotomy-right hemicolectomy/sigmoid colectomy with end ostomy on 4/18: Stable-General surgery following and directing care-remains on Zosyn + liquid stool in ostomy bag.  Tolerating soft diet and clinically improving including leukocyte count, repeat CT on 11/01/2021 noted with questionable multiple areas of fluid collection will defer management of this issue to general surgery.  Since oral intake is better will withhold TNA on 11/01/2021 and monitor. ? ? ?Acute hypoxic respiratory failure: Likely due to atelectasis/possible PNA-on Zosyn.  CTA stable negative for PE, this problem has resolved with supportive care and mostly with pulmonary toiletry. ? ?HTN: BP gradually  improving for now as needed IV  hydralazine. ? ?HLD: Resume fenofibrate/statin  ? ?CAD s/p CABG approximately 10 years ago: No anginal symptoms-resume aspirin/statin ?  ?GERD: Continue PPI ?  ?Anxiety: Continue as needed Klonopin ?  ?Insomnia: Continue as needed temazepam ? ?Hypophosphatemia.  Replaced.   ? ?OSA: CPAP nightly, noncompliant strictly counseled.  Requested to get home CPAP machine on 10/29/2021 for better complaince. ? ?PICC line in place.  Right arm, remove once TNA and IV antibiotics have finished.  And he remains clinically stable for a few days. ? ?DM-2 (A1c 6.0 on 4/14): Diet controlled at home-monitor with SSI.  ? ?Recent Labs  ?  10/31/21 ?1602 10/31/21 ?2121 11/01/21 ?0733  ?GLUCAP 221* 192* 141*  ?  ?  ? ? ?BMI: ?Estimated body mass index is 26.16 kg/m? as calculated from the following: ?  Height as of this encounter: 5\' 10"  (1.778 m). ?  Weight as of this encounter: 82.7 kg.  ? ?Code status: ?  Code Status: Full Code  ? ?DVT Prophylaxis: ?enoxaparin (LOVENOX) injection 40 mg Start: 10/21/21 1200 ?SCDs Start: 10/20/21 1800 ?  ?  ?Family Communication:  ? ?Spouse-Pamela-[phone]-at bedside on 10/30/2021, 11/01/2021.   ? ?Daughter bedside 10/31/2021 ? ? ?Disposition Plan: ?Status is: Inpatient ?Remains inpatient appropriate because: Perforated diverticulitis s/p laparotomy with colectomy and ostomy placement-not yet stable for discharge. ?Planned Discharge Destination:Home versus SNF ? ? ?Diet: ?Diet Order   ? ?       ?  DIET SOFT Room service appropriate? Yes; Fluid consistency: Thin  Diet effective now       ?  ? ?  ?  ? ?  ?  ? ? ?Antimicrobial agents: ?Anti-infectives (From admission, onward)  ? ? Start     Dose/Rate Route Frequency Ordered Stop  ? 10/20/21 2300  piperacillin-tazobactam (ZOSYN) IVPB 3.375 g       ? 3.375 g ?12.5 mL/hr over 240 Minutes Intravenous Every 8 hours 10/20/21 1854 10/30/21 2359  ? 10/20/21 1700  piperacillin-tazobactam (ZOSYN) IVPB 3.375 g       ? 3.375 g ?100 mL/hr over 30 Minutes Intravenous   Once 10/20/21 1656 10/20/21 1745  ? ?  ? ? ? ?MEDICATIONS: ?Scheduled Meds: ? acetaminophen  1,000 mg Oral Q6H  ? aspirin EC  81 mg Oral Daily  ? Chlorhexidine Gluconate Cloth  6 each Topical Daily  ? enoxaparin (LOVENOX) injection  40 mg Subcutaneous Q24H  ? feeding supplement  237 mL Oral BID BM  ? fenofibrate  160 mg Oral Daily  ? fluticasone  2 spray Each Nare QHS  ? insulin aspart  0-15 Units Subcutaneous TID WC  ? insulin aspart  0-5 Units Subcutaneous QHS  ? pantoprazole  40 mg Oral BID  ? rosuvastatin  10 mg Oral Daily  ? ?Continuous Infusions: ? sodium chloride 10 mL/hr at 10/28/21 2107  ? methocarbamol (ROBAXIN) IV Stopped (10/30/21 2200)  ? ?PRN Meds:.sodium chloride, albuterol, artificial tears, clonazePAM, hydrALAZINE, HYDROmorphone (DILAUDID) injection, methocarbamol (ROBAXIN) IV, ondansetron **OR** ondansetron (ZOFRAN) IV, oxyCODONE, sodium chloride flush, temazepam ? ? ?I have personally reviewed following labs and imaging studies ? ?LABORATORY DATA: ? ?Recent Labs  ?Lab 10/27/21 ?0405 10/28/21 ?0359 10/29/21 ?1027 10/31/21 ?[id number] 11/01/21 ?0353  ?WBC 14.7* 15.2* 17.6* 19.1* 15.9*  ?HGB 11.2* 9.9* 10.5* 10.2* 10.4*  ?HCT 33.7* 30.5* 31.7* 30.5* 32.4*  ?PLT 629* 568* 598* 536* 580*  ?MCV 91.3 91.6 90.1 90.0 91.0  ?MCH 30.4 29.7 29.8 30.1 29.2  ?MCHC 33.2  32.5 33.1 33.4 32.1  ?RDW 13.9 14.0 14.4 14.4 14.1  ?LYMPHSABS  --   --   --  1.0 1.2  ?MONOABS  --   --   --  2.0* 1.9*  ?EOSABS  --   --   --  0.3 0.2  ?BASOSABS  --   --   --  0.1 0.0  ? ? ?Recent Labs  ?Lab 10/27/21 ?0405 10/28/21 ?[id number] 10/29/21 ?0422 10/29/21 ?1027 10/30/21 ?[id number] 10/31/21 ?0335  ?NA 130* 133* 134*  --  134* 134*  ?K 3.0* 3.5 3.6  --  4.1 4.2  ?CL 102 103 102  --  102 104  ?CO2 22 22 26   --  25 25  ?GLUCOSE 131* 166* 148*  --  169* 173*  ?BUN 16 9 6*  --  8 10  ?CREATININE 0.76 0.70 0.65  --  0.62 0.67  ?CALCIUM 7.9* 8.0* 7.9*  --  8.6* 8.8*  ?AST 27  --   --   --   --  32  ?ALT 16  --   --   --   --  18  ?ALKPHOS 42  --   --   --    --  76  ?BILITOT 0.7  --   --   --   --  0.5  ?ALBUMIN 1.7*  --   --   --   --  1.7*  ?MG 2.0 1.8 1.9  --  2.0 1.8  ?PHOS 2.1* 1.6* 2.4*  --  2.8 3.3  ?BNP 144.4*  --   --  447.0*  --   --   ? ? ?MICR

## 2021-11-01 NOTE — Consult Note (Addendum)
[facility] Nurse ostomy follow up ?Patient receiving care in [facility] [place] 5W20 ?Stoma type/location: LMQ colostomy ?Stomal assessment/size: 1 5/8" oval, retracted in a crease ?Peristomal assessment: minor skin irritation around the border of the stoma. May use stoma powder at next change.  ?Treatment options for stomal/peristomal skin: skin barrier ring. No sting barrier wipes.  ?Output: thin brown 575 cc emptied from pouch ?Ostomy pouching: 2pc. 2 1/4 pouch Kellie Simmering # [phone]) Skin barrier Kellie Simmering # [phone]) Barrier ring Kellie Simmering # [phone]) ?Education provided: Wife present today for teaching and education. Education materials left with the wife.  ?Wife prepared new pouch by cutting wafer to size, attaching the pouch to the wafer and closing the pouch.  ?The wife emptied the pouch before removing with my assistance ?Demonstrated "burping" flatus from pouch ?Demonstrated use of wick to clean spout  ?Discussed treatment of peristomal skin (ostomy powder, skin barrier wipes) ?While cleaning around the stoma it had projectile output very thin and brown, twice before new pouch put in place.  ?Wife cleaned around the stoma and used no sting barrier wipes on the skin around the stoma. She stretched the barrier ring to size too large so we made the adjustment used the extra in the crease at 3 o'clock (patient view). We then put the pouch in place. Abdominal dressing changed by myself due to soiling during pouch change. Cleaned patient, spouse helped clean him up and put clean underwear on and clean gown. Put clean sheets on the bed after cleaning the mattress.  ?Answered patient/family questions:   ?Reminded the wife about the ostomy clinic and she stated their son lives with them and may want to participate in education so that he can help them as well. I will return on Thursday unless needed prior to this.  ?Patient sitting up in chair when I left.  ? ?Enrolled patient in [place] Discharge program: Yes ?  ?I recommend/discussed the  [facility] outpatient ostomy clinic as an outpatient resource.  IF MD agrees this would be beneficial, please fax referral, or enter electronically in Epic the referral. (Fax- [phone])  ?  ?WOC will follow for teaching and education until discharge. ?  ?Cathlean Marseilles. Tamala Julian, MSN, RN, CMSRN, AGCNS, WTA ?Wound Treatment Associate ?Pager [phone] ?

## 2021-11-01 NOTE — Progress Notes (Signed)
Calorie Count Note ? ?48 - hour calorie count ordered. ? ?Diet: Soft, thin liquids  ?Supplements: Boost Breeze - TID ? ?Day 1  ?Lunch 4/24: 224 kcal, 17 gm protein ?Dinner 4/24: No Documentation ?Breakfast 4/25: No Documentation ?Supplements: 0 kcal, 0 gm protein ? ?Total intake: ?224 kcal (11% of minimum estimated needs)  ?17 protein (17% of minimum estimated needs) ? ?Nutrition Dx: Moderate Malnutrition related to chronic illness as evidenced by mild fat depletion, mild muscle depletion, energy intake < 75% for > or equal to 1 month. ? ?Goal: Patient will meet greater than or equal to 90% of their needs. ? ?Intervention:  ?Boost Breeze - TID ?Multivitamin w/ minerals daily ? ? ?Kirby Crigler RD, LDN ?Clinical Dietitian ?See AMiON for contact information.  ? ? ?

## 2021-11-02 DIAGNOSIS — K5720 Diverticulitis of large intestine with perforation and abscess without bleeding: Secondary | ICD-10-CM | POA: Diagnosis not present

## 2021-11-02 LAB — URIC ACID: Uric Acid, Serum: 2.7 mg/dL — ABNORMAL LOW (ref 3.7–8.6)

## 2021-11-02 LAB — BRAIN NATRIURETIC PEPTIDE: B Natriuretic Peptide: 115.5 pg/mL — ABNORMAL HIGH (ref 0.0–100.0)

## 2021-11-02 LAB — CBC WITH DIFFERENTIAL/PLATELET
Abs Immature Granulocytes: 0.6 10*3/uL — ABNORMAL HIGH (ref 0.00–0.07)
Basophils Absolute: 0 10*3/uL (ref 0.0–0.1)
Basophils Relative: 0 %
Eosinophils Absolute: 0.2 10*3/uL (ref 0.0–0.5)
Eosinophils Relative: 1 %
HCT: 32.3 % — ABNORMAL LOW (ref 39.0–52.0)
Hemoglobin: 10.6 g/dL — ABNORMAL LOW (ref 13.0–17.0)
Immature Granulocytes: 3 %
Lymphocytes Relative: 7 %
Lymphs Abs: 1.3 10*3/uL (ref 0.7–4.0)
MCH: 29.1 pg (ref 26.0–34.0)
MCHC: 32.8 g/dL (ref 30.0–36.0)
MCV: 88.7 fL (ref 80.0–100.0)
Monocytes Absolute: 2.1 10*3/uL — ABNORMAL HIGH (ref 0.1–1.0)
Monocytes Relative: 11 %
Neutro Abs: 14.9 10*3/uL — ABNORMAL HIGH (ref 1.7–7.7)
Neutrophils Relative %: 78 %
Platelets: 653 10*3/uL — ABNORMAL HIGH (ref 150–400)
RBC: 3.64 MIL/uL — ABNORMAL LOW (ref 4.22–5.81)
RDW: 14 % (ref 11.5–15.5)
WBC: 19.1 10*3/uL — ABNORMAL HIGH (ref 4.0–10.5)
nRBC: 0.1 % (ref 0.0–0.2)

## 2021-11-02 LAB — BASIC METABOLIC PANEL
Anion gap: 8 (ref 5–15)
BUN: 13 mg/dL (ref 8–23)
CO2: 21 mmol/L — ABNORMAL LOW (ref 22–32)
Calcium: 9.1 mg/dL (ref 8.9–10.3)
Chloride: 100 mmol/L (ref 98–111)
Creatinine, Ser: 0.63 mg/dL (ref 0.61–1.24)
GFR, Estimated: >60 mL/min (ref >60)
Glucose, Bld: 133 mg/dL — ABNORMAL HIGH (ref 70–99)
Potassium: 4.2 mmol/L (ref 3.5–5.1)
Sodium: 129 mmol/L — ABNORMAL LOW (ref 135–145)

## 2021-11-02 LAB — MAGNESIUM: Magnesium: 1.8 mg/dL (ref 1.7–2.4)

## 2021-11-02 LAB — GLUCOSE, CAPILLARY
Glucose-Capillary: 162 mg/dL — ABNORMAL HIGH (ref 70–99)
Glucose-Capillary: 143 mg/dL — ABNORMAL HIGH (ref 70–99)
Glucose-Capillary: 213 mg/dL — ABNORMAL HIGH (ref 70–99)

## 2021-11-02 LAB — OSMOLALITY, URINE: Osmolality, Ur: 599 mOsm/kg (ref 300–900)

## 2021-11-02 LAB — PROCALCITONIN: Procalcitonin: 0.74 ng/mL

## 2021-11-02 LAB — OSMOLALITY: Osmolality: 274 mOsm/kg — ABNORMAL LOW (ref 275–295)

## 2021-11-02 LAB — SODIUM, URINE, RANDOM: Sodium, Ur: 101 mmol/L

## 2021-11-02 MED ORDER — LACTATED RINGERS IV SOLN
INTRAVENOUS | Status: AC
Start: 2021-11-02 — End: 2021-11-02

## 2021-11-02 MED ORDER — CARVEDILOL 3.125 MG PO TABS
3.1250 mg | ORAL_TABLET | Freq: Two times a day (BID) | ORAL | Status: DC
  Administered 2021-11-02 – 2021-11-04 (×4): 3.125 mg via ORAL
  Filled 2021-11-02 (×4): qty 1

## 2021-11-02 NOTE — Progress Notes (Signed)
CSW received phone call from the [place] stating that patient receives care at the [facility] and RN coordinators for dc needs there are: ? ?Reni P. [phone]) ?Rosalva Ferron [phone]) ?

## 2021-11-02 NOTE — Progress Notes (Signed)
Physical Therapy Treatment ?Patient Details ?Name: Robert Barrett ?MRN: [id number] ?DOB: 1945/10/27 ?Today's Date: 11/02/2021 ? ? ?History of Present Illness 76 y.o. male presented to the hospital 10/20/21 with abdominal pain. CT scan of his abdomen revealed perforated diverticulitis with a 6 cm abscess. 4/18 s/p Exploratory laparotomy, right hemicolectomy, sigmoid colectomy with end colostomy    PMH significant of DM-2, HTN, HLD, CAD s/p CABG repeat CT scan was performed on 4/18 right hemicolectomy, sigmoid colectomy and end ostomy placement ? ?  ?PT Comments  ? ? Patient feeling better compared to 4/25 and able to ambulate farther distance and with better velocity. Poor pleth while holding RW, however when stopped to get reading sats 93% on room air with mild dyspnea. Patient reports he has a RW at home already.  ?   ?Recommendations for follow up therapy are one component of a multi-disciplinary discharge planning process, led by the attending physician.  Recommendations may be updated based on patient status, additional functional criteria and insurance authorization. ? ?Follow Up Recommendations ? No PT follow up ?  ?  ?Assistance Recommended at Discharge PRN  ?Patient can return home with the following A little help with walking and/or transfers;Assistance with cooking/housework;Help with stairs or ramp for entrance ?  ?Equipment Recommendations ? None recommended by PT  ?  ?Recommendations for Other Services   ? ? ?  ?Precautions / Restrictions Precautions ?Precautions: Fall ?Precaution Comments: RLQ JP drain ?Restrictions ?Weight Bearing Restrictions: No  ?  ? ?Mobility ? Bed Mobility ?Overal bed mobility: Needs Assistance ?Bed Mobility: Rolling, Sidelying to Sit ?Rolling: Modified independent (Device/Increase time) ?Sidelying to sit: Modified independent (Device/Increase time), HOB elevated ?  ?  ?  ?General bed mobility comments: pt likes to raise [facility] and then roll onto his side (attempted to have him try  from [facility] flat and pt refused); ?  ? ?Transfers ?Overall transfer level: Needs assistance ?Equipment used: Rolling walker (2 wheels) ?Transfers: Sit to/from Stand ?Sit to Stand: Supervision ?  ?  ?  ?  ?  ?General transfer comment: cues needed for hand placement with stand to sit ?  ? ?Ambulation/Gait ?Ambulation/Gait assistance: Min guard ?Gait Distance (Feet): 450 Feet ?Assistive device: Rolling walker (2 wheels) ?Gait Pattern/deviations: Decreased stride length, Step-through pattern, Wide base of support ?Gait velocity: better pace than 4/25 ?  ?  ?General Gait Details: pt asking for cane (none available) and agreed to use RW; much less reliance via UEs on RW compared to 4/25; required cues for proximity and upright posture x 1 ? ? ?Stairs ?  ?  ?  ?  ?  ? ? ?Wheelchair Mobility ?  ? ?Modified Rankin (Stroke Patients Only) ?  ? ? ?  ?Balance Overall balance assessment: Needs assistance ?Sitting-balance support: No upper extremity supported, Feet supported ?Sitting balance-Leahy Scale: Good ?  ?  ?Standing balance support: During functional activity ?Standing balance-Leahy Scale: Fair ?Standing balance comment: feels weak and prefers UE support for dynamic tasks ?  ?  ?  ?  ?  ?  ?  ?  ?  ?  ?  ?  ? ?  ?Cognition Arousal/Alertness: Awake/alert ?Behavior During Therapy: [facility] for tasks assessed/performed ?Overall Cognitive Status: Within Functional Limits for tasks assessed ?  ?  ?  ?  ?  ?  ?  ?  ?  ?  ?  ?  ?  ?  ?  ?  ?  ?  ?  ? ?  ?Exercises  Other Exercises ?Other Exercises: sit to stand x 5 reps for strengthening and balance ? ?  ?General Comments   ?  ?  ? ?Pertinent Vitals/Pain Pain Assessment ?Pain Assessment: Faces ?Faces Pain Scale: Hurts a little bit ?Pain Location: abdomen ?Pain Descriptors / Indicators: Tightness, Other (Comment) (itching) ?Pain Intervention(s): Limited activity within patient's tolerance  ? ? ?Home Living   ?  ?  ?  ?  ?  ?  ?  ?  ?  ?   ?  ?Prior Function    ?  ?  ?   ? ?PT Goals  (current goals can now be found in the care plan section) Acute Rehab PT Goals ?Patient Stated Goal: decr pain and go home ?Time For Goal Achievement: 11/10/21 ?Potential to Achieve Goals: Good ?Progress towards PT goals: Progressing toward goals ? ?  ?Frequency ? ? ? Min 3X/week ? ? ? ?  ?PT Plan Current plan remains appropriate  ? ? ?Co-evaluation   ?  ?  ?  ?  ? ?  ?AM-PAC PT "6 Clicks" Mobility   ?Outcome Measure ? Help needed turning from your back to your side while in a flat bed without using bedrails?: None ?Help needed moving from lying on your back to sitting on the side of a flat bed without using bedrails?: None ?Help needed moving to and from a bed to a chair (including a wheelchair)?: A Little ?Help needed standing up from a chair using your arms (e.g., wheelchair or bedside chair)?: A Little ?Help needed to walk in hospital room?: A Little ?Help needed climbing 3-5 steps with a railing? : A Little ?6 Click Score: 20 ? ?  ?End of Session   ?Activity Tolerance: Patient tolerated treatment well ?Patient left: with call bell/phone within reach;with family/visitor present;in chair ?Nurse Communication: Mobility status ?PT Visit Diagnosis: Difficulty in walking, not elsewhere classified (R26.2);Pain ?Pain - Right/Left:  (midline incision) ?Pain - part of body:  (abdomen) ?  ? ? ?Time: 3532-9924 ?PT Time Calculation (min) (ACUTE ONLY): 19 min ? ?Charges:  $Gait Training: 8-22 mins          ?          ? ? ?Jerolyn Center, PT ?Acute Rehabilitation Services  ?Pager [phone] ?Office [phone] ? ? ? ?Scherrie November Lovett Coffin ?11/02/2021, 8:33 AM ? ?

## 2021-11-02 NOTE — Progress Notes (Signed)
[facility] ?Progress Note ? ?8 Days Post-Op  ?Subjective: ?CC-  ?Overall feeling better. Denies any current abdominal pain, nausea, or vomiting. Appetite slowly improving. Tolerating diet. Needs to work more with [facility] on colostomy care. ?Started augmentin yesterday. WBC up 19, afebrile. ? ?Objective: ?Vital signs in last 24 hours: ?Temp:  [97 ?F (36.1 ?C)-98.3 ?F (36.8 ?C)] 97 ?F (36.1 ?C) (04/26 0800) ?Pulse Rate:  [89-96] 89 (04/26 0800) ?Resp:  [17-20] 17 (04/26 0800) ?BP: (113-142)/(72-107) 132/107 (04/26 0800) ?SpO2:  [91 %-96 %] 95 % (04/26 0800) ?Last BM Date : 11/02/21 ? ?Intake/Output from previous day: ?04/25 0701 - 04/26 0700 ?In: 20 [I.V.:20] ?Out: [id number] [Urine:925; Drains:20; [id number] ?Intake/Output this shift: ?Total I/O ?In: 20 [I.V.:20] ?Out: 100 [Stool:100] ? ?PE: ?Gen:  Alert, NAD, pleasant ?Pulm: rate and effort normal ?Abd: soft, minimal distension, nontender, open midline pale pink with some fibrinous exudate at central aspect, murky fluid at distal aspect, fascia intact, ostomy retracted/ air and stool in bag, JP serous  ? ?Lab Results:  ?Recent Labs  ?  11/01/21 ?[id number] 11/02/21 ?0418  ?WBC 15.9* 19.1*  ?HGB 10.4* 10.6*  ?HCT 32.4* 32.3*  ?PLT 580* 653*  ? ?BMET ?Recent Labs  ?  10/31/21 ?[id number] 11/02/21 ?0418  ?NA 134* 129*  ?K 4.2 4.2  ?CL 104 100  ?CO2 25 21*  ?GLUCOSE 173* 133*  ?BUN 10 13  ?CREATININE 0.67 0.63  ?CALCIUM 8.8* 9.1  ? ?PT/INR ?No results for input(s): LABPROT, INR in the last 72 hours. ?CMP  ?   ?Component Value Date/Time  ? NA 129 (L) 11/02/2021 0418  ? NA 137 01/19/2021 0000  ? K 4.2 11/02/2021 0418  ? CL 100 11/02/2021 0418  ? CO2 21 (L) 11/02/2021 0418  ? GLUCOSE 133 (H) 11/02/2021 0418  ? BUN 13 11/02/2021 0418  ? BUN 6 01/19/2021 0000  ? CREATININE 0.63 11/02/2021 0418  ? CREATININE 1.17 04/13/2016 1422  ? CALCIUM 9.1 11/02/2021 0418  ? PROT 5.0 (L) 10/31/2021 0335  ? ALBUMIN 1.7 (L) 10/31/2021 0335  ? AST 32 10/31/2021 0335  ? ALT 18 10/31/2021 0335  ?  ALKPHOS 76 10/31/2021 0335  ? BILITOT 0.5 10/31/2021 0335  ? GFRNONAA >60 11/02/2021 0418  ? GFRNONAA 62 04/13/2016 1422  ? GFRAA 72 04/13/2016 1422  ? ?Lipase  ?   ?Component Value Date/Time  ? LIPASE 28 10/20/2021 0852  ? ? ? ? ? ?Studies/Results: ?CT ABDOMEN PELVIS W CONTRAST ? ?Result Date: 11/01/2021 ?CLINICAL DATA:  Status post laparotomy with Hartmann's procedure on 10/25/2021. Postop abdominal pain. EXAM: CT ABDOMEN AND PELVIS WITH CONTRAST TECHNIQUE: Multidetector CT imaging of the abdomen and pelvis was performed using the standard protocol following bolus administration of intravenous contrast. RADIATION DOSE REDUCTION: This exam was performed according to the departmental dose-optimization program which includes automated exposure control, adjustment of the mA and/or kV according to patient size and/or use of iterative reconstruction technique. CONTRAST:  165mL OMNIPAQUE IOHEXOL 300 MG/ML  SOLN COMPARISON:  10/25/2021 FINDINGS: Lower chest: Trace pleural fluid bilaterally. Bilateral posterior lung base subsegmental atelectasis. The Hepatobiliary: No focal liver abnormality is seen. No gallstones, gallbladder wall thickening, or biliary dilatation. Pancreas: Unremarkable. No pancreatic ductal dilatation or surrounding inflammatory changes. Spleen: Normal in size without focal abnormality. Adrenals/Urinary Tract: Normal adrenal glands. 9 mm low-density structure within posterolateral cortex of upper pole of right kidney is too small to characterize, image 41/3. Bosniak class 1 exophytic cyst arising off the interpolar left kidney measures 1.7 cm,  image 43/3. No follow-up recommended. No nephrolithiasis or hydronephrosis. There is debris noted layering within the dependent portion of the bladder. Small amount of gas is noted within the non dependent portion of the bladder. Stomach/Bowel: There is diffuse distension of the stomach. Left lower quadrant colostomy. Status post Hartmann's procedure. Increase  caliber of the small bowel loops with scattered air-fluid levels are identified. Small bowel loops measure up to 3.3 cm in diameter. Right hemicolectomy with enterocolonic anastomosis. There are 2 tiny nonspecific extraluminal foci of gas identified adjacent to the anastomotic suture chain, image 42/3. Vascular/Lymphatic: Aortic atherosclerosis. No aneurysm. No signs of portal venous gas. The portal vein is patent. Prominent upper abdominal lymph nodes are likely reactive. These measure up to 1 cm. Reproductive: Prostate gland enlargement. Other: Percutaneous drainage catheter enters the pelvis from a right lower quadrant approach. The catheter traverses the midline of the pelvis with tip terminating in the left lower quadrant of the abdomen. Multifocal areas of fluid loculation are identified within the abdomen, including: -Peripherally enhancing loculated perihepatic fluid collection is identified which extends over the inferior margin of the right hepatic lobe, image 46/3. -Within the right lower quadrant there is a immature fluid collection measuring 3.5 x 3.9 cm, image 55/3. -immature, interloop fluid collection within the left lower quadrant of the abdomen measures 3.9 x 2.4 cm, image 65/3. -immature fluid collection within the left lower quadrant of the abdomen measures 4.2 x 2.3 cm, image 61/3. -small gas and fluid collection along the undersurface of the midline ventral abdominal wall measures 2.5 x 0.8 cm, image 42/3. Diffuse soft tissue stranding is identified within the remaining portions of the peritoneal and mesenteric fat which is favored to represent sequelae of peritonitis. A few tiny foci of extraluminal gas noted within the ventral abdomen, which is likely postoperative. Musculoskeletal: Open, midline ventral abdominal incision site is identified. IMPRESSION: 1. Status post Hartmann's procedure with left lower quadrant colostomy. 2. Multifocal areas of loculated fluid loculation are identified  within the abdomen and pelvis. Findings are concerning for early abscess formation. The most mature fluid collection extends around the inferior margin of the right hepatic lobe. 3. Diffuse distension of the stomach and small bowel loops with scattered air-fluid levels. Findings are favored to represent postoperative ileus. 4. Trace pleural fluid bilaterally with bilateral posterior lung base subsegmental atelectasis. 5. Small amount of gas noted within the dependent portion of the bladder. Correlate for any clinical signs or symptoms of cystitis. 6. Aortic Atherosclerosis (ICD10-I70.0). Electronically Signed   By: Kerby Moors M.D.   On: 11/01/2021 09:14   ? ?Anti-infectives: ?Anti-infectives (From admission, onward)  ? ? Start     Dose/Rate Route Frequency Ordered Stop  ? 11/01/21 1245  amoxicillin-clavulanate (AUGMENTIN) 875-125 MG per tablet 1 tablet       ? 1 tablet Oral Every 12 hours 11/01/21 1147    ? 10/20/21 2300  piperacillin-tazobactam (ZOSYN) IVPB 3.375 g       ? 3.375 g ?12.5 mL/hr over 240 Minutes Intravenous Every 8 hours 10/20/21 1854 10/30/21 2359  ? 10/20/21 1700  piperacillin-tazobactam (ZOSYN) IVPB 3.375 g       ? 3.375 g ?100 mL/hr over 30 Minutes Intravenous  Once 10/20/21 1656 10/20/21 1745  ? ?  ? ? ? ?Assessment/Plan ?-POD#8 s/p Exploratory laparotomy, right hemicolectomy, sigmoid colectomy with end colostomy 4/18 Dr. Kae Heller ?- intraop findings: Large bowel obstruction with perforated cecum and diffuse feculent peritonitis, sigmoid diverticulitis with dense inflammatory adhesions to the bladder  as well as associated localized abscess ?- path diverticulitis sigmoid with abscess, necrosis of cecum- all benign ?- CT scan 4/25 with multiple small fluid collections concerning for developing abscesses. Nothing drainable by IR. Restarted oral antibiotics (augmentin) 4/25. Some purulent fluid noted to be decompressing through midline today. ?- Continue BID wet to dry dressing changes to midline  abdominal wound ?- d/c JP drain 4/26  ?- WOC following for new colostomy - patient/wife would benefit from more education this admission ?- WBC is up today but patient clinically improved. Tolerating diet and

## 2021-11-02 NOTE — Consult Note (Signed)
[facility] Nurse ostomy follow up ?Patient receiving care in [facility] 5W20 ?Bedside RN states that the pouch has been leaking in the crease and getting into the wound.  ?Stoma type/location: LMQ colostomy ?Stomal assessment/size: 1 5/8" oval, retracted in a crease ?Peristomal assessment: minor skin irritation around the border of the stoma. Used stoma powder today ?Treatment options for stomal/peristomal ?Ostomy pouching: changed the pouch to a convex. We will see how this works and will try and ostomy belt also.  ?This was an unplanned visit. No visitors present. Plan to see the patient again tomorrow and do another education and teaching session with the wife and maybe the son if he can come. ? ?Renaldo Reel Katrinka Blazing, MSN, RN, CMSRN, AGCNS, WTA ?Wound Treatment Associate ?Pager [phone]  ?

## 2021-11-02 NOTE — Progress Notes (Signed)
Calorie Count Note ? ?48 - hour calorie count ordered. ? ?Diet: Soft, thin liquids  ?Supplements: Boost Breeze - TID ? ?Day 2  ?Lunch 4/25: 0 kcal, 0 gm protein ?Dinner 4/25: 255 kcal, 11 gm protein ?Breakfast 4/26: No Documentation ?Supplements: 250 kcal, 9 gm protein ? ?Total intake: ?505 kcal (25% of minimum estimated needs)  ?20 protein (20% of minimum estimated needs) ? ?Nutrition Dx: Moderate Malnutrition related to chronic illness as evidenced by mild fat depletion, mild muscle depletion, energy intake < 75% for > or equal to 1 month. ? ?Goal: Patient will meet greater than or equal to 90% of their needs. ? ?Intervention:  ?Boost Breeze - TID ?Multivitamin w/ minerals daily ? ? ?Kirby Crigler RD, LDN ?Clinical Dietitian ?See AMiON for contact information.  ? ? ?

## 2021-11-02 NOTE — Progress Notes (Signed)
?      ?                 PROGRESS NOTE ? ?      ?PATIENT DETAILS ?Name: Robert Barrett ?Age: 76 y.o. ?Sex: male ?Date of Birth: [birth year] ?Admit Date: 10/20/2021 ?Admitting Physician Maretta BeesShanker M Ghimire, MD ?OZD[name] is a 76 y.o.  male with past medical history of DM-2, HTN, HLD, CAD s/p CABG-presented with abdominal pain-found to have perforated diverticulitis with abscess.  Patient was admitted to the hospitalist service-started on Zosyn and other supportive care-General surgery followed closely, repeat CT scan was performed on 4/18 which showed worsening changes with moderate free intraperitoneal air, pneumatosis.  Patient was subsequently taken to the operating room by general surgery where he underwent right hemicolectomy, sigmoid colectomy and end ostomy placement.  See below for further details. ? ?Significant events: ?4/13>> admit [facility] for perforated diverticulitis with 6 cm abscess. ?4/14>> per IR-no appropriate window to place drain. ?4/18>> repeat CT abdomen worsening free air/pneumatosis-surgery performed right hemicolectomy/sigmoid colectomy and end ostomy placement. ?4/19>> mild hypotension that responded to IVF. ?4/20>> developed hypoxemia requiring around 8 L of O2.  CTA chest negative for PE. ? ?Significant studies: ?4/13>> CT abdomen/pelvis: Perforated diverticulitis-6 cm abscess.  Upstream distention of large bowel-concerning for secondary large bowel obstruction. ?4/18>> CT abdomen/pelvis: New moderate free intraperitoneal air, severe ileus, dilated cecum with pneumatosis ?4/24 CT >> 1. Status post Hartmann's procedure with left lower quadrant colostomy. 2. Multifocal areas of loculated fluid loculation are identified within the abdomen and pelvis. Findings are concerning for early abscess formation. The most mature fluid collection extends around the inferior margin of the right hepatic lobe. 3. Diffuse distension of the stomach and small bowel loops  with scattered air-fluid levels. Findings are favored to represent postoperative ileus. 4. Trace pleural fluid bilaterally with bilateral posterior lung base subsegmental atelectasis. 5. Small amount of gas noted within the dependent portion of the bladder. Correlate for any clinical signs or symptoms of cystitis. 6. Aortic Atherosclerosis ? ? ?Significant microbiology data: ?None ? ?Procedures: ?None ? ?Consults: ?General surgery ? ?Subjective: Patient in bed, appears comfortable, denies any headache, no fever, no chest pain or pressure, no shortness of breath , no abdominal pain. No focal weakness. ? ?Objective: ?Vitals: ?Blood pressure (!) 132/107, pulse 89, temperature (!) 97 ?F (36.1 ?C), temperature source Axillary, resp. rate 17, height 5\' 10"  (1.778 m), weight 82.7 kg, SpO2 95 %.  ? ?Exam: ? ?Awake Alert, No new F.N deficits, Normal affect ?[place].AT,PERRAL ?Supple Neck, No JVD,   ?Symmetrical Chest wall movement, Good air movement bilaterally, CTAB ?RRR,No Gallops, Rubs or new Murmurs,  ?+ve B.Sounds, Abd Soft, No tenderness,  abdominal dressing in place with JP drain, colostomy bag has liquid stool, right arm PICC line  ?No Cyanosis, Clubbing or edema  ? ? ? ?Assessment/Plan: ? ?Perforated diverticulitis-s/p exploratory laparotomy-right hemicolectomy/sigmoid colectomy with end ostomy on 4/18: Stable-General surgery following and directing care-remains on Zosyn + liquid stool in ostomy bag.  Tolerating soft diet and clinically improving including leukocyte count, repeat CT on 11/01/2021 noted with questionable multiple areas of fluid collection will defer management of this issue to general surgery.  Since oral intake is better will withhold TNA on 11/01/2021 and monitor. ? ? ?Acute hypoxic respiratory failure: Likely due to atelectasis/possible PNA-on Zosyn.  CTA stable negative for PE, this problem has resolved with supportive care and mostly with pulmonary toiletry. ? ?HTN: BP gradually  improving for now as  needed IV hydralazine. ? ?HLD: Resume fenofibrate/statin  ? ?CAD s/p CABG approximately 10 years ago: No anginal symptoms-resume aspirin/statin ?  ?GERD: Continue PPI ?  ?Anxiety: Continue as needed Klonopin ?  ?Insomnia: Continue as needed temazepam ? ?Hyponatremia.  Check serum osmolality, urine osmolality and sodium.  Gentle hydration and monitor.  Restrict free water. ? ?OSA: CPAP nightly, noncompliant strictly counseled.  Requested to get home CPAP machine on 10/29/2021 for better complaince. ? ?PICC line in place.  Right arm, will remove prior to discharge if he remains clinically stable. ? ?DM-2 (A1c 6.0 on 4/14): Diet controlled at home-monitor with SSI.  ? ?Recent Labs  ?  11/01/21 ?1555 11/01/21 ?2134 11/02/21 ?0827  ?GLUCAP 158* 136* 162*  ?  ?  ? ? ?BMI: ?Estimated body mass index is 26.16 kg/m? as calculated from the following: ?  Height as of this encounter: 5\' 10"  (1.778 m). ?  Weight as of this encounter: 82.7 kg.  ? ?Code status: ?  Code Status: Full Code  ? ?DVT Prophylaxis: ?enoxaparin (LOVENOX) injection 40 mg Start: 10/21/21 1200 ?SCDs Start: 10/20/21 1800 ?  ?  ?Family Communication:  ? ?Spouse-Pamela-[phone]-at bedside on 10/30/2021, 11/01/2021.   ? ?Daughter bedside 10/31/2021 ? ? ?Disposition Plan: ?Status is: Inpatient ?Remains inpatient appropriate because: Perforated diverticulitis s/p laparotomy with colectomy and ostomy placement-not yet stable for discharge. ?Planned Discharge Destination:Home versus SNF ? ? ?Diet: ?Diet Order   ? ?       ?  DIET SOFT Room service appropriate? Yes; Fluid consistency: Thin; Fluid restriction: 1500 mL Fluid  Diet effective now       ?  ? ?  ?  ? ?  ?  ? ? ?Antimicrobial agents: ?Anti-infectives (From admission, onward)  ? ? Start     Dose/Rate Route Frequency Ordered Stop  ? 11/01/21 1245  amoxicillin-clavulanate (AUGMENTIN) 875-125 MG per tablet 1 tablet       ? 1 tablet Oral Every 12 hours 11/01/21 1147    ? 10/20/21 2300  piperacillin-tazobactam  (ZOSYN) IVPB 3.375 g       ? 3.375 g ?12.5 mL/hr over 240 Minutes Intravenous Every 8 hours 10/20/21 1854 10/30/21 2359  ? 10/20/21 1700  piperacillin-tazobactam (ZOSYN) IVPB 3.375 g       ? 3.375 g ?100 mL/hr over 30 Minutes Intravenous  Once 10/20/21 1656 10/20/21 1745  ? ?  ? ? ? ?MEDICATIONS: ?Scheduled Meds: ? acetaminophen  1,000 mg Oral Q6H  ? amoxicillin-clavulanate  1 tablet Oral Q12H  ? aspirin EC  81 mg Oral Daily  ? Chlorhexidine Gluconate Cloth  6 each Topical Daily  ? enoxaparin (LOVENOX) injection  40 mg Subcutaneous Q24H  ? feeding supplement  1 Container Oral TID BM  ? fenofibrate  160 mg Oral Daily  ? fluticasone  2 spray Each Nare QHS  ? insulin aspart  0-15 Units Subcutaneous TID WC  ? multivitamin with minerals  1 tablet Oral Daily  ? pantoprazole  40 mg Oral BID  ? rosuvastatin  10 mg Oral Daily  ? ?Continuous Infusions: ? sodium chloride 10 mL/hr at 10/28/21 2107  ? lactated ringers    ? methocarbamol (ROBAXIN) IV Stopped (10/30/21 2200)  ? ?PRN Meds:.sodium chloride, albuterol, artificial tears, clonazePAM, hydrALAZINE, HYDROmorphone (DILAUDID) injection, methocarbamol (ROBAXIN) IV, ondansetron **OR** ondansetron (ZOFRAN) IV, oxyCODONE, temazepam ? ? ?I have personally reviewed following labs and imaging studies ? ?LABORATORY DATA: ? ?Recent Labs  ?Lab 10/28/21 ?0359 10/29/21 ?  1027 10/31/21 ?2694 11/01/21 ?8546 11/02/21 ?2703  ?WBC 15.2* 17.6* 19.1* 15.9* 19.1*  ?HGB 9.9* 10.5* 10.2* 10.4* 10.6*  ?HCT 30.5* 31.7* 30.5* 32.4* 32.3*  ?PLT 568* 598* 536* 580* 653*  ?MCV 91.6 90.1 90.0 91.0 88.7  ?MCH 29.7 29.8 30.1 29.2 29.1  ?MCHC 32.5 33.1 33.4 32.1 32.8  ?RDW 14.0 14.4 14.4 14.1 14.0  ?LYMPHSABS  --   --  1.0 1.2 1.3  ?MONOABS  --   --  2.0* 1.9* 2.1*  ?EOSABS  --   --  0.3 0.2 0.2  ?BASOSABS  --   --  0.1 0.0 0.0  ? ? ?Recent Labs  ?Lab 10/27/21 ?0405 10/28/21 ?0359 10/29/21 ?0422 10/29/21 ?1027 10/30/21 ?5009 10/31/21 ?3818 11/01/21 ?2993 11/02/21 ?7169  ?NA 130* 133* 134*  --  134* 134*   --  129*  ?K 3.0* 3.5 3.6  --  4.1 4.2  --  4.2  ?CL 102 103 102  --  102 104  --  100  ?CO2 22 22 26   --  25 25  --  21*  ?GLUCOSE 131* 166* 148*  --  169* 173*  --  133*  ?BUN 16 9 6*  --  8 10  --  13  ?CRE

## 2021-11-03 DIAGNOSIS — K5720 Diverticulitis of large intestine with perforation and abscess without bleeding: Secondary | ICD-10-CM | POA: Diagnosis not present

## 2021-11-03 LAB — BASIC METABOLIC PANEL
Anion gap: 7 (ref 5–15)
BUN: 9 mg/dL (ref 8–23)
CO2: 22 mmol/L (ref 22–32)
Calcium: 8.8 mg/dL — ABNORMAL LOW (ref 8.9–10.3)
Chloride: 101 mmol/L (ref 98–111)
Creatinine, Ser: 0.64 mg/dL (ref 0.61–1.24)
GFR, Estimated: >60 mL/min (ref >60)
Glucose, Bld: 140 mg/dL — ABNORMAL HIGH (ref 70–99)
Potassium: 4.2 mmol/L (ref 3.5–5.1)
Sodium: 130 mmol/L — ABNORMAL LOW (ref 135–145)

## 2021-11-03 LAB — GLUCOSE, CAPILLARY
Glucose-Capillary: 136 mg/dL — ABNORMAL HIGH (ref 70–99)
Glucose-Capillary: 150 mg/dL — ABNORMAL HIGH (ref 70–99)
Glucose-Capillary: 164 mg/dL — ABNORMAL HIGH (ref 70–99)
Glucose-Capillary: 133 mg/dL — ABNORMAL HIGH (ref 70–99)

## 2021-11-03 LAB — CBC WITH DIFFERENTIAL/PLATELET
Abs Immature Granulocytes: 0.3 10*3/uL — ABNORMAL HIGH (ref 0.00–0.07)
Basophils Absolute: 0 10*3/uL (ref 0.0–0.1)
Basophils Relative: 0 %
Eosinophils Absolute: 0.2 10*3/uL (ref 0.0–0.5)
Eosinophils Relative: 1 %
HCT: 31.7 % — ABNORMAL LOW (ref 39.0–52.0)
Hemoglobin: 10.6 g/dL — ABNORMAL LOW (ref 13.0–17.0)
Immature Granulocytes: 2 %
Lymphocytes Relative: 6 %
Lymphs Abs: 0.9 10*3/uL (ref 0.7–4.0)
MCH: 29.7 pg (ref 26.0–34.0)
MCHC: 33.4 g/dL (ref 30.0–36.0)
MCV: 88.8 fL (ref 80.0–100.0)
Monocytes Absolute: 2 10*3/uL — ABNORMAL HIGH (ref 0.1–1.0)
Monocytes Relative: 12 %
Neutro Abs: 13 10*3/uL — ABNORMAL HIGH (ref 1.7–7.7)
Neutrophils Relative %: 79 %
Platelets: 650 10*3/uL — ABNORMAL HIGH (ref 150–400)
RBC: 3.57 MIL/uL — ABNORMAL LOW (ref 4.22–5.81)
RDW: 13.7 % (ref 11.5–15.5)
WBC: 16.5 10*3/uL — ABNORMAL HIGH (ref 4.0–10.5)
nRBC: 0 % (ref 0.0–0.2)

## 2021-11-03 LAB — PROCALCITONIN: Procalcitonin: 0.33 ng/mL

## 2021-11-03 LAB — BRAIN NATRIURETIC PEPTIDE: B Natriuretic Peptide: 105.3 pg/mL — ABNORMAL HIGH (ref 0.0–100.0)

## 2021-11-03 MED ORDER — ENSURE ENLIVE PO LIQD
237.0000 mL | Freq: Two times a day (BID) | ORAL | Status: DC
  Administered 2021-11-03 – 2021-11-04 (×2): 237 mL via ORAL

## 2021-11-03 MED ORDER — PROSOURCE PLUS PO LIQD
30.0000 mL | Freq: Two times a day (BID) | ORAL | Status: DC
  Administered 2021-11-03 – 2021-11-04 (×2): 30 mL via ORAL
  Filled 2021-11-03 (×2): qty 30

## 2021-11-03 MED ORDER — OXYCODONE HCL 5 MG PO TABS
2.5000 mg | ORAL_TABLET | ORAL | Status: DC | PRN
  Administered 2021-11-03 – 2021-11-04 (×4): 5 mg via ORAL
  Filled 2021-11-03 (×4): qty 1

## 2021-11-03 NOTE — Progress Notes (Signed)
Physical Therapy Treatment ?Patient Details ?Name: Robert Barrett ?MRN: [id number] ?DOB: 04-Dec-1945 ?Today's Date: 11/03/2021 ? ? ?History of Present Illness 76 y.o. male presented to the hospital 10/20/21 with abdominal pain. CT scan of his abdomen revealed perforated diverticulitis with a 6 cm abscess. 4/18 s/p Exploratory laparotomy, right hemicolectomy, sigmoid colectomy with end colostomy    PMH significant of DM-2, HTN, HLD, CAD s/p CABG repeat CT scan was performed on 4/18 right hemicolectomy, sigmoid colectomy and end ostomy placement ? ?  ?PT Comments  ? ? Patient feeling better this p.m. and moving better although still requires use of RW due to feeling weak/unsteady. His goal is to return to walking without the RW but does not yet feel he can do that. Frequent cues for being lighter on his hands on the walker.   ?  ?Recommendations for follow up therapy are one component of a multi-disciplinary discharge planning process, led by the attending physician.  Recommendations may be updated based on patient status, additional functional criteria and insurance authorization. ? ?Follow Up Recommendations ? No PT follow up ?  ?  ?Assistance Recommended at Discharge PRN  ?Patient can return home with the following A little help with walking and/or transfers;Assistance with cooking/housework;Help with stairs or ramp for entrance ?  ?Equipment Recommendations ? None recommended by PT  ?  ?Recommendations for Other Services   ? ? ?  ?Precautions / Restrictions Precautions ?Precautions: Fall ?Precaution Comments: RLQ JP drain ?Restrictions ?Weight Bearing Restrictions: No  ?  ? ?Mobility ? Bed Mobility ?Overal bed mobility: Modified Independent ?Bed Mobility: Rolling, Sidelying to Sit ?Rolling: Modified independent (Device/Increase time) ?Sidelying to sit: Modified independent (Device/Increase time), HOB elevated ?  ?  ?  ?  ?  ? ?Transfers ?Overall transfer level: Modified independent ?Equipment used: Rolling walker (2  wheels) ?Transfers: Sit to/from Stand ?Sit to Stand: Modified independent (Device/Increase time) ?  ?  ?  ?  ?  ?  ?  ? ?Ambulation/Gait ?Ambulation/Gait assistance: Supervision ?Gait Distance (Feet): 550 Feet ?Assistive device: Rolling walker (2 wheels) ?Gait Pattern/deviations: Decreased stride length, Step-through pattern, Wide base of support ?  ?Gait velocity interpretation: >2.62 ft/sec, indicative of community ambulatory ?  ?General Gait Details: pt still feels unsteady enough to use RW; his goal is to walk without the RW ? ? ?Stairs ?  ?  ?  ?  ?  ? ? ?Wheelchair Mobility ?  ? ?Modified Rankin (Stroke Patients Only) ?  ? ? ?  ?Balance Overall balance assessment: Needs assistance ?Sitting-balance support: No upper extremity supported, Feet supported ?Sitting balance-Leahy Scale: Good ?  ?  ?Standing balance support: During functional activity ?Standing balance-Leahy Scale: Fair ?Standing balance comment: feels weak and prefers UE support for dynamic tasks ?  ?  ?  ?  ?  ?  ?  ?  ?  ?  ?  ?  ? ?  ?Cognition Arousal/Alertness: Awake/alert ?Behavior During Therapy: [facility] for tasks assessed/performed ?Overall Cognitive Status: Within Functional Limits for tasks assessed ?  ?  ?  ?  ?  ?  ?  ?  ?  ?  ?  ?  ?  ?  ?  ?  ?  ?  ?  ? ?  ?Exercises   ? ?  ?General Comments General comments (skin integrity, edema, etc.): Wife present ?  ?  ? ?Pertinent Vitals/Pain Pain Assessment ?Pain Assessment: Faces ?Faces Pain Scale: Hurts a little bit ?Pain Location: abdomen ?Pain Descriptors /  Indicators: Tightness, Other (Comment) (itching) ?Pain Intervention(s): Limited activity within patient's tolerance  ? ? ?Home Living   ?  ?  ?  ?  ?  ?  ?  ?  ?  ?   ?  ?Prior Function    ?  ?  ?   ? ?PT Goals (current goals can now be found in the care plan section) Acute Rehab PT Goals ?Patient Stated Goal: decr pain and go home ?Time For Goal Achievement: 11/10/21 ?Potential to Achieve Goals: Good ?Progress towards PT goals: Progressing  toward goals ? ?  ?Frequency ? ? ? Min 3X/week ? ? ? ?  ?PT Plan Current plan remains appropriate  ? ? ?Co-evaluation   ?  ?  ?  ?  ? ?  ?AM-PAC PT "6 Clicks" Mobility   ?Outcome Measure ? Help needed turning from your back to your side while in a flat bed without using bedrails?: None ?Help needed moving from lying on your back to sitting on the side of a flat bed without using bedrails?: None ?Help needed moving to and from a bed to a chair (including a wheelchair)?: None ?Help needed standing up from a chair using your arms (e.g., wheelchair or bedside chair)?: None ?Help needed to walk in hospital room?: A Little ?Help needed climbing 3-5 steps with a railing? : A Little ?6 Click Score: 22 ? ?  ?End of Session   ?Activity Tolerance: Patient tolerated treatment well ?Patient left: with call bell/phone within reach;with family/visitor present;in chair ?Nurse Communication: Mobility status ?PT Visit Diagnosis: Difficulty in walking, not elsewhere classified (R26.2);Pain ?Pain - Right/Left:  (midline incision) ?Pain - part of body:  (abdomen) ?  ? ? ?Time: 7412-8786 ?PT Time Calculation (min) (ACUTE ONLY): 20 min ? ?Charges:  $Gait Training: 8-22 mins          ?          ? ? ?Jerolyn Center, PT ?Acute Rehabilitation Services  ?Pager [phone] ?Office [phone] ? ? ? ?Scherrie November Janaiah Vetrano ?11/03/2021, 4:00 PM ? ?

## 2021-11-03 NOTE — Progress Notes (Signed)
?      ?                 PROGRESS NOTE ? ?      ?PATIENT DETAILS ?Name: Robert Barrett ?Age: 76 y.o. ?Sex: male ?Date of Birth: 04-03-1946 ?Admit Date: 10/20/2021 ?Admitting Physician Maretta Bees, MD ?[name] is a 76 y.o.  male with past medical history of DM-2, HTN, HLD, CAD s/p CABG-presented with abdominal pain-found to have perforated diverticulitis with abscess.  Patient was admitted to the hospitalist service-started on Zosyn and other supportive care-General surgery followed closely, repeat CT scan was performed on 4/18 which showed worsening changes with moderate free intraperitoneal air, pneumatosis.  Patient was subsequently taken to the operating room by general surgery where he underwent right hemicolectomy, sigmoid colectomy and end ostomy placement.  See below for further details. ? ?Significant events: ?4/13>> admit to [facility] for perforated diverticulitis with 6 cm abscess. ?4/14>> per IR-no appropriate window to place drain. ?4/18>> repeat CT abdomen worsening free air/pneumatosis-surgery performed right hemicolectomy/sigmoid colectomy and end ostomy placement. ?4/19>> mild hypotension that responded to IVF. ?4/20>> developed hypoxemia requiring around 8 L of O2.  CTA chest negative for PE. ? ?Significant studies: ?4/13>> CT abdomen/pelvis: Perforated diverticulitis-6 cm abscess.  Upstream distention of large bowel-concerning for secondary large bowel obstruction. ?4/18>> CT abdomen/pelvis: New moderate free intraperitoneal air, severe ileus, dilated cecum with pneumatosis ?4/24 CT >> 1. Status post Hartmann's procedure with left lower quadrant colostomy. 2. Multifocal areas of loculated fluid loculation are identified within the abdomen and pelvis. Findings are concerning for early abscess formation. The most mature fluid collection extends around the inferior margin of the right hepatic lobe. 3. Diffuse distension of the stomach and small bowel loops  with scattered air-fluid levels. Findings are favored to represent postoperative ileus. 4. Trace pleural fluid bilaterally with bilateral posterior lung base subsegmental atelectasis. 5. Small amount of gas noted within the dependent portion of the bladder. Correlate for any clinical signs or symptoms of cystitis. 6. Aortic Atherosclerosis ? ? ?Significant microbiology data: ?None ? ?Procedures: ?None ? ?Consults: ?General surgery ? ?Subjective: Patient in bed, appears comfortable, denies any headache, no fever, no chest pain or pressure, no shortness of breath , no abdominal pain. No new focal weakness. ? ? ?Objective: ?Vitals: ?Blood pressure 126/76, pulse 84, temperature 98.1 ?F (36.7 ?C), temperature source Oral, resp. rate (!) 23, height 5\' 10"  (1.778 m), weight 82.7 kg, SpO2 93 %.  ? ?Exam: ? ?Awake Alert, No new F.N deficits, Normal affect ?[place].AT,PERRAL ?Supple Neck, No JVD,   ?Symmetrical Chest wall movement, Good air movement bilaterally, CTAB ?RRR,No Gallops, Rubs or new Murmurs,  ?+ve B.Sounds, Abd Soft, No tenderness, abdominal dressing in place with JP drain, colostomy bag has liquid stool, right arm PICC line  ?No Cyanosis, Clubbing or edema  ? ? ?Assessment/Plan: ? ?Perforated diverticulitis-s/p exploratory laparotomy-right hemicolectomy/sigmoid colectomy with end ostomy on 4/18: Stable-General surgery following and directing care-remains on Zosyn + liquid stool in ostomy bag.  Tolerating soft diet and clinically improving including leukocyte count, repeat CT on 11/01/2021 noted with questionable multiple areas of fluid collection will defer management of this issue to general surgery.  According to patient and wife his oral intake is at his baseline, per general surgery if stable discharge home on 11/04/2021. ? ?Acute hypoxic respiratory failure: Likely due to atelectasis/possible PNA-on Zosyn.  CTA stable negative for PE, this problem has resolved with supportive care and mostly  with pulmonary  toiletry. ? ?HTN: BP gradually improving for now as needed IV hydralazine. ? ?HLD: Resume fenofibrate/statin  ? ?CAD s/p CABG approximately 10 years ago: No anginal symptoms-resume aspirin/statin ?  ?GERD: Continue PPI ?  ?Anxiety: Continue as needed Klonopin ?  ?Insomnia: Continue as needed temazepam ? ?Hyponatremia.  Due to dehydration improved with IV fluids. ? ?OSA: CPAP nightly, noncompliant strictly counseled.  Requested to get home CPAP machine on 10/29/2021 for better complaince. ? ?PICC line in place.  Right arm, will remove prior to discharge if he remains clinically stable. ? ?DM-2 (A1c 6.0 on 4/14): Diet controlled at home-monitor with SSI.  ? ?Recent Labs  ?  11/02/21 ?1635 11/03/21 ?0645 11/03/21 ?0754  ?GLUCAP 213* 136* 150*  ?  ?  ? ? ?BMI: ?Estimated body mass index is 26.16 kg/m? as calculated from the following: ?  Height as of this encounter: 5\' 10"  (1.778 m). ?  Weight as of this encounter: 82.7 kg.  ? ?Code status: ?  Code Status: Full Code  ? ?DVT Prophylaxis: ?enoxaparin (LOVENOX) injection 40 mg Start: 10/21/21 1200 ?SCDs Start: 10/20/21 1800 ?  ?  ?Family Communication:  ? ?Spouse-[phone]-at bedside on 10/30/2021, 11/01/2021.   ? ?Daughter bedside 10/31/2021 ? ? ?Disposition Plan: ?Status is: Inpatient ?Remains inpatient appropriate because: Perforated diverticulitis s/p laparotomy with colectomy and ostomy placement-not yet stable for discharge. ?Planned Discharge Destination:Home versus SNF ? ? ?Diet: ?Diet Order   ? ?       ?  DIET SOFT Room service appropriate? Yes; Fluid consistency: Thin; Fluid restriction: 1500 mL Fluid  Diet effective now       ?  ? ?  ?  ? ?  ?  ? ? ?Antimicrobial agents: ?Anti-infectives (From admission, onward)  ? ? Start     Dose/Rate Route Frequency Ordered Stop  ? 11/01/21 1245  amoxicillin-clavulanate (AUGMENTIN) 875-125 MG per tablet 1 tablet       ? 1 tablet Oral Every 12 hours 11/01/21 1147    ? 10/20/21 2300  piperacillin-tazobactam (ZOSYN) IVPB  3.375 g       ? 3.375 g ?12.5 mL/hr over 240 Minutes Intravenous Every 8 hours 10/20/21 1854 10/30/21 2359  ? 10/20/21 1700  piperacillin-tazobactam (ZOSYN) IVPB 3.375 g       ? 3.375 g ?100 mL/hr over 30 Minutes Intravenous  Once 10/20/21 1656 10/20/21 1745  ? ?  ? ? ? ?MEDICATIONS: ?Scheduled Meds: ? acetaminophen  1,000 mg Oral Q6H  ? amoxicillin-clavulanate  1 tablet Oral Q12H  ? aspirin EC  81 mg Oral Daily  ? carvedilol  3.125 mg Oral BID WC  ? Chlorhexidine Gluconate Cloth  6 each Topical Daily  ? enoxaparin (LOVENOX) injection  40 mg Subcutaneous Q24H  ? feeding supplement  1 Container Oral TID BM  ? fenofibrate  160 mg Oral Daily  ? fluticasone  2 spray Each Nare QHS  ? insulin aspart  0-15 Units Subcutaneous TID WC  ? multivitamin with minerals  1 tablet Oral Daily  ? pantoprazole  40 mg Oral BID  ? rosuvastatin  10 mg Oral Daily  ? ?Continuous Infusions: ? sodium chloride 10 mL/hr at 10/28/21 2107  ? methocarbamol (ROBAXIN) IV Stopped (10/30/21 2200)  ? ?PRN Meds:.sodium chloride, albuterol, artificial tears, clonazePAM, hydrALAZINE, HYDROmorphone (DILAUDID) injection, methocarbamol (ROBAXIN) IV, ondansetron **OR** ondansetron (ZOFRAN) IV, oxyCODONE, temazepam ? ? ?I have personally reviewed following labs and imaging studies ? ?LABORATORY DATA: ? ?Recent Labs  ?Lab 10/29/21 ?1027  10/31/21 ?3244 11/01/21 ?0102 11/02/21 ?7253 11/03/21 ?0425  ?WBC 17.6* 19.1* 15.9* 19.1* 16.5*  ?HGB 10.5* 10.2* 10.4* 10.6* 10.6*  ?HCT 31.7* 30.5* 32.4* 32.3* 31.7*  ?PLT 598* 536* 580* 653* 650*  ?MCV 90.1 90.0 91.0 88.7 88.8  ?MCH 29.8 30.1 29.2 29.1 29.7  ?MCHC 33.1 33.4 32.1 32.8 33.4  ?RDW 14.4 14.4 14.1 14.0 13.7  ?LYMPHSABS  --  1.0 1.2 1.3 0.9  ?MONOABS  --  2.0* 1.9* 2.1* 2.0*  ?EOSABS  --  0.3 0.2 0.2 0.2  ?BASOSABS  --  0.1 0.0 0.0 0.0  ? ? ?Recent Labs  ?Lab 10/28/21 ?0359 10/29/21 ?0422 10/29/21 ?1027 10/30/21 ?6644 10/31/21 ?0347 11/01/21 ?4259 11/02/21 ?5638 11/03/21 ?0425  ?NA 133* 134*  --  134* 134*  --   129* 130*  ?K 3.5 3.6  --  4.1 4.2  --  4.2 4.2  ?CL 103 102  --  102 104  --  100 101  ?CO2 22 26  --  25 25  --  21* 22  ?GLUCOSE 166* 148*  --  169* 173*  --  133* 140*  ?BUN 9 6*  --  8 10  --  13 9  ?CREATINI

## 2021-11-03 NOTE — Consult Note (Addendum)
WOC Nurse ostomy follow up ?Patient receiving care in [facility] 5W20 ?Stoma type/location: LMQ colostomy ?Stomal assessment/size: 1 5/8" oval, retracted, in a crease. ?Peristomal assessment: minor skin irritation around the border of the stoma. Stoma powder used. ?Treatment options for stomal/peristomal skin: Barrier ring, no sting skin barrier wipes, stoma powder ?Output: thin green/brown approx 150 cc ?Ostomy pouching: 1pc. Flexible convex [place] # [phone]) Primitivo Gauze [place] # [phone])  ?Education provided: None today as the bedside RN sent [place] stating pouch was leaking and getting into the wound. Spouse not present at time of pouch chang. Changed today to a 1 piece flexible convex pouch, using a barrier ring and piece of a barrier ring in the crease.  ?Enrolled patient in [place] Secure Start Discharge program: Yes ?Bedside RN states she will let the spouse empty the pouch and video a complete pouch change. ? ?WOC will continue to follow while admitted. ? ?I recommend/discussed the [facility] outpatient ostomy clinic as an outpatient resource.  IF MD agrees this would be beneficial, please fax referral, or enter electronically in Epic the referral. (Fax- [phone])  ? ?Renaldo Reel Katrinka Blazing, MSN, RN, CMSRN, AGCNS, WTA ?Wound Treatment Associate ?Pager [phone]  ?

## 2021-11-03 NOTE — Progress Notes (Signed)
Nutrition Follow-up ? ?DOCUMENTATION CODES:  ? ?Non-severe (moderate) malnutrition in context of chronic illness ? ?INTERVENTION:  ? ?Discontinue Boost Breeze ?Ensure Enlive po BID, each supplement provides 350 kcal and 20 grams of protein. ?30 ml ProSource Plus BID, each supplement provides 100 kcals and 15 grams protein.  ?Magic cup BID with meals, each supplement provides 290 kcal and 9 grams of protein ?Encourage good PO intake ?Continue Multivitamin w/ minerals daily ? ?NUTRITION DIAGNOSIS:  ? ?Moderate Malnutrition related to chronic illness as evidenced by mild fat depletion, mild muscle depletion, energy intake < 75% for > or equal to 1 month. - Ongoing  ? ?GOAL:  ? ?Patient will meet greater than or equal to 90% of their needs - Ongoing ? ?MONITOR:  ? ?PO intake, Supplement acceptance, Labs, Skin ? ?REASON FOR ASSESSMENT:  ? ?Consult ?Calorie Count ? ?ASSESSMENT:  ? ?76 y.o. male with hx od DM type 2, HTN, HLD, and CAD s/p CABG presented to ED with abdominal pain x 3 weeks. Imaging in ED showed a perforated diverticulitis with 6 x 3 cm abscess ? ?4/18 - Ex Lap s/p R hemicolectomy, sigmoid colectomy w/ end colostomy  ?4/19 - diet advanced to clear liquids; TPN initiated  ?4/21 - diet advanced to full liquids ?4/23 - diet advanced to Soft; Calorie Count started  ?4/25 - TPN stopped ? ?Pt resting in bed.  ? ?Pt reports that he ate all of his breakfast this morning and that he ate part of his dinner, but that is more than he would of at home. ?RD provided pt with Strawberry Ensure Enlive for pt to trial. Pt drank whole thing during RD visit. Stated that he preferred the Ensure over the [employer]. RD to change order.  ? ?Continue to encourage pt to increase PO intake especially protein to promote wound healing.  ? ?Per surgery's note, possible discharge tomorrow.  ? ?Medications reviewed and include: Augmentin, SSI 0-15 units TID, MVI, Protonix ?Labs reviewed: Sodium 130, 24 hr CBG 136-213 ? ?Diet Order:    ?Diet Order   ? ?       ?  DIET SOFT Room service appropriate? Yes; Fluid consistency: Thin; Fluid restriction: 1500 mL Fluid  Diet effective now       ?  ? ?  ?  ? ?  ? ?EDUCATION NEEDS:  ? ?Education needs have been addressed ? ?Skin:  Skin Assessment: Skin Integrity Issues: ?Skin Integrity Issues:: Incisions ?Incisions: abdomen (closed) ? ?Last BM:  4/27 - 700 mL via colostomy ? ?Height:  ?Ht Readings from Last 1 Encounters:  ?10/23/21 5\' 10"  (1.778 m)  ? ?Weight:  ?Wt Readings from Last 1 Encounters:  ?10/23/21 82.7 kg  ? ?Ideal Body Weight:  75.5 kg ? ?BMI:  Body mass index is 26.16 kg/m?. ? ?Estimated Nutritional Needs:  ? ?Kcal:  2000-2200 kcal/d ? ?Protein:  100-110 g/d ? ?Fluid:  2-2.2 L/d ? ? ? ?Hermina Barters RD, LDN ?Clinical Dietitian ?See AMiON for contact information.  ? ?

## 2021-11-03 NOTE — Progress Notes (Signed)
OT Cancellation Note ? ?Patient Details ?Name: Robert Barrett ?MRN: [id number] ?DOB: 22-Nov-1945 ? ? ?Cancelled Treatment:    Reason Eval/Treat Not Completed: Pain limiting ability to participate  Pt politely declined OOB this session stating he didn't feel up to it today.  Will try to check back tomorrow. ? ?[name] ?11/03/2021, 11:41 AM ?

## 2021-11-03 NOTE — Progress Notes (Signed)
[facility] ?Progress Note ? ?9 Days Post-Op  ?Subjective: ?CC-  ?Comfortable this morning. Denies abdominal pain, nausea, vomiting. Tolerating soft diet. Colostomy productive with 800cc output last 24 hours. ?WBC down 16.5, afebrile ? ?Objective: ?Vital signs in last 24 hours: ?Temp:  [98 ?F (36.7 ?C)-98.6 ?F (37 ?C)] 98.1 ?F (36.7 ?C) (04/27 0827) ?Pulse Rate:  [81-91] 84 (04/27 0827) ?Resp:  [21-25] 23 (04/27 0827) ?BP: (126-145)/(70-118) 126/76 (04/27 0827) ?SpO2:  [89 %-96 %] 93 % (04/27 0827) ?Last BM Date : 11/02/21 ? ?Intake/Output from previous day: ?04/26 0701 - 04/27 0700 ?In: 804.9 [P.O.:120; I.V.:684.9] ?Out: 2405 [Urine:1600; Drains:5; Stool:800] ?Intake/Output this shift: ?No intake/output data recorded. ? ?PE: ?Gen:  Alert, NAD, pleasant ?Pulm: rate and effort normal ?Abd: soft, minimal distension, nontender, open midline pale pink with increased fibrinous exudate at the base (some is liquified but I do not see any overt purulence), some superficial dehiscence without evisceration; ostomy retracted/ air and stool in bag ? ?Lab Results:  ?Recent Labs  ?  11/02/21 ?0418 11/03/21 ?0425  ?WBC 19.1* 16.5*  ?HGB 10.6* 10.6*  ?HCT 32.3* 31.7*  ?PLT 653* 650*  ? ?BMET ?Recent Labs  ?  11/02/21 ?0418 11/03/21 ?0425  ?NA 129* 130*  ?K 4.2 4.2  ?CL 100 101  ?CO2 21* 22  ?GLUCOSE 133* 140*  ?BUN 13 9  ?CREATININE 0.63 0.64  ?CALCIUM 9.1 8.8*  ? ?PT/INR ?No results for input(s): LABPROT, INR in the last 72 hours. ?CMP  ?   ?Component Value Date/Time  ? NA 130 (L) 11/03/2021 0425  ? NA 137 01/19/2021 0000  ? K 4.2 11/03/2021 0425  ? CL 101 11/03/2021 0425  ? CO2 22 11/03/2021 0425  ? GLUCOSE 140 (H) 11/03/2021 0425  ? BUN 9 11/03/2021 0425  ? BUN 6 01/19/2021 0000  ? CREATININE 0.64 11/03/2021 0425  ? CREATININE 1.17 04/13/2016 1422  ? CALCIUM 8.8 (L) 11/03/2021 0425  ? PROT 5.0 (L) 10/31/2021 0335  ? ALBUMIN 1.7 (L) 10/31/2021 0335  ? AST 32 10/31/2021 0335  ? ALT 18 10/31/2021 0335  ? ALKPHOS 76  10/31/2021 0335  ? BILITOT 0.5 10/31/2021 0335  ? GFRNONAA >60 11/03/2021 0425  ? GFRNONAA 62 04/13/2016 1422  ? GFRAA 72 04/13/2016 1422  ? ?Lipase  ?   ?Component Value Date/Time  ? LIPASE 28 10/20/2021 0852  ? ? ? ? ? ?Studies/Results: ?No results found. ? ?Anti-infectives: ?Anti-infectives (From admission, onward)  ? ? Start     Dose/Rate Route Frequency Ordered Stop  ? 11/01/21 1245  amoxicillin-clavulanate (AUGMENTIN) 875-125 MG per tablet 1 tablet       ? 1 tablet Oral Every 12 hours 11/01/21 1147    ? 10/20/21 2300  piperacillin-tazobactam (ZOSYN) IVPB 3.375 g       ? 3.375 g ?12.5 mL/hr over 240 Minutes Intravenous Every 8 hours 10/20/21 1854 10/30/21 2359  ? 10/20/21 1700  piperacillin-tazobactam (ZOSYN) IVPB 3.375 g       ? 3.375 g ?100 mL/hr over 30 Minutes Intravenous  Once 10/20/21 1656 10/20/21 1745  ? ?  ? ? ? ?Assessment/Plan ?-POD#9 s/p Exploratory laparotomy, right hemicolectomy, sigmoid colectomy with end colostomy 4/18 Dr. Fredricka Bonine ?- intraop findings: Large bowel obstruction with perforated cecum and diffuse feculent peritonitis, sigmoid diverticulitis with dense inflammatory adhesions to the bladder as well as associated localized abscess ?- path diverticulitis sigmoid with abscess, necrosis of cecum- all benign ?- CT scan 4/25 with multiple small fluid collections concerning for developing  abscesses. Nothing drainable by IR. Restarted oral antibiotics (augmentin) 4/25. Some purulent fluid noted to be decompressing through midline today. ?- d/c JP drain 4/26  ?- Change to dry dressing to midline abdominal wound TID. I trimmed a little of the fibrinous tissue out today ?- WOC following for new colostomy - patient/wife would benefit from more education this admission, planning to practice more today ?- WBC trending down on augmentin. Patient clinically improving. Repeat CBC in AM. If patient/wife comfortable with wound/ostomy care may be ready for discharge tomorrow ?  ?ID - zosyn 4/13>>4/23,  augmentin 4/25>> ?FEN - soft diet, Ensure ?VTE - lovenox ?Foley - dc 4/21, out and voiding ?  ?ABL anemia ?T2DM ?HTN ?HLD ?CAD s/p CABG ?GERD ?Anxiety ?OSA ? ? ? LOS: 14 days  ? ? ?Franne Forts, PA-C ?[facility] ?11/03/2021, 10:17 AM ?Please see Amion for pager number during day hours 7:00am-4:30pm ? ?

## 2021-11-04 ENCOUNTER — Telehealth: Payer: Self-pay

## 2021-11-04 ENCOUNTER — Other Ambulatory Visit (HOSPITAL_COMMUNITY): Payer: Self-pay

## 2021-11-04 DIAGNOSIS — K5720 Diverticulitis of large intestine with perforation and abscess without bleeding: Secondary | ICD-10-CM | POA: Diagnosis not present

## 2021-11-04 LAB — CBC WITH DIFFERENTIAL/PLATELET
Abs Immature Granulocytes: 0.24 10*3/uL — ABNORMAL HIGH (ref 0.00–0.07)
Basophils Absolute: 0 10*3/uL (ref 0.0–0.1)
Basophils Relative: 0 %
Eosinophils Absolute: 0.2 10*3/uL (ref 0.0–0.5)
Eosinophils Relative: 1 %
HCT: 31.4 % — ABNORMAL LOW (ref 39.0–52.0)
Hemoglobin: 10.5 g/dL — ABNORMAL LOW (ref 13.0–17.0)
Immature Granulocytes: 2 %
Lymphocytes Relative: 8 %
Lymphs Abs: 1.1 10*3/uL (ref 0.7–4.0)
MCH: 29.8 pg (ref 26.0–34.0)
MCHC: 33.4 g/dL (ref 30.0–36.0)
MCV: 89.2 fL (ref 80.0–100.0)
Monocytes Absolute: 1.8 10*3/uL — ABNORMAL HIGH (ref 0.1–1.0)
Monocytes Relative: 13 %
Neutro Abs: 11.1 10*3/uL — ABNORMAL HIGH (ref 1.7–7.7)
Neutrophils Relative %: 76 %
Platelets: 662 10*3/uL — ABNORMAL HIGH (ref 150–400)
RBC: 3.52 MIL/uL — ABNORMAL LOW (ref 4.22–5.81)
RDW: 13.7 % (ref 11.5–15.5)
WBC: 14.5 10*3/uL — ABNORMAL HIGH (ref 4.0–10.5)
nRBC: 0 % (ref 0.0–0.2)

## 2021-11-04 LAB — GLUCOSE, CAPILLARY: Glucose-Capillary: 162 mg/dL — ABNORMAL HIGH (ref 70–99)

## 2021-11-04 MED ORDER — CARVEDILOL 3.125 MG PO TABS
3.1250 mg | ORAL_TABLET | Freq: Two times a day (BID) | ORAL | 0 refills | Status: DC
  Filled 2021-11-04: qty 60, 30d supply, fill #0

## 2021-11-04 MED ORDER — AMOXICILLIN-POT CLAVULANATE 875-125 MG PO TABS
1.0000 | ORAL_TABLET | Freq: Two times a day (BID) | ORAL | 0 refills | Status: DC
Start: 2021-11-04 — End: 2022-09-02
  Filled 2021-11-04: qty 14, 7d supply, fill #0

## 2021-11-04 MED ORDER — ENSURE ENLIVE PO LIQD
237.0000 mL | Freq: Two times a day (BID) | ORAL | 12 refills | Status: DC
  Filled 2021-11-04: qty 237, 1d supply, fill #0

## 2021-11-04 MED ORDER — OXYCODONE HCL 5 MG PO TABS
2.5000 mg | ORAL_TABLET | Freq: Four times a day (QID) | ORAL | 0 refills | Status: DC | PRN
  Filled 2021-11-04: qty 20, 5d supply, fill #0

## 2021-11-04 NOTE — Telephone Encounter (Signed)
Called and spoke to patients wife, "so far so good" told them of hte challenges with [facility] authorization, of home health, wold not likely get approval before Monday, NO HH to see them this weekend. Return precautions  discussed. Gave my number for any questions or concerns.  D/w Kandee Keen at [facility].  He will continue to follow up with [facility], he called as well and left his contact information.  ?

## 2021-11-04 NOTE — Discharge Summary (Signed)
?                                                                                ? Robert Barrett ZOX:[id number] DOB: March 22, 1946 DOA: 10/20/2021 ? ?PCP: [facility] ? ?Admit date: 10/20/2021  Discharge date: 11/04/2021 ? ?Admitted From: Home   Disposition:  Home ? ? ?Recommendations for Outpatient Follow-up:  ? ?Follow up with PCP in 1-2 weeks ? ?PCP Please obtain BMP/CBC, 2 view CXR in 1week,  (see Discharge instructions)  ? ?PCP Please follow up on the following pending results: Monitor abdominal wound closely needs close outpatient general surgery follow-up and monitoring. ? ? ?Home Health: RN   ?Equipment/Devices: As below  ?Consultations: [facility] ?Discharge Condition: Fair  ?CODE STATUS: Full    ?Diet Recommendation: Heart Healthy  ?  ? ?Chief Complaint  ?Patient presents with  ? Abdominal Pain  ?  ? ?Brief history of present illness from the day of admission and additional interim summary   ? ?Patient is a 76 y.o.  male with past medical history of DM-2, HTN, HLD, CAD s/p CABG-presented with abdominal pain-found to have perforated diverticulitis with abscess.  Patient was admitted to the hospitalist service-started on Zosyn and other supportive care-General surgery followed closely, repeat CT scan was performed on 4/18 which showed worsening changes with moderate free intraperitoneal air, pneumatosis.  Patient was subsequently taken to the operating room by general surgery where he underwent right hemicolectomy, sigmoid colectomy and end ostomy placement.  See below for further details. ?  ?Significant events: ?4/13>> admit to [facility] for perforated diverticulitis with 6 cm abscess. ?4/14>> per IR-no appropriate window to place drain. ?4/18>> repeat CT abdomen worsening free air/pneumatosis-surgery performed right hemicolectomy/sigmoid colectomy and end ostomy placement. ?4/19>> mild hypotension that responded to  IVF. ?4/20>> developed hypoxemia requiring around 8 L of O2.  CTA chest negative for PE. ?  ?Significant studies: ?4/13>> CT abdomen/pelvis: Perforated diverticulitis-6 cm abscess.  Upstream distention of large bowel-concerning for secondary large bowel obstruction. ?4/18>> CT abdomen/pelvis: New moderate free intraperitoneal air, severe ileus, dilated cecum with pneumatosis ?4/24 CT >> 1. Status post Hartmann's procedure with left lower quadrant colostomy. 2. Multifocal areas of loculated fluid loculation are identified within the abdomen and pelvis. Findings are concerning for early abscess formation. The most mature fluid collection extends around the inferior margin of the right hepatic lobe. 3. Diffuse distension of the stomach and small bowel loops with scattered air-fluid levels. Findings are favored to represent postoperative ileus. 4. Trace pleural fluid bilaterally with bilateral posterior lung base subsegmental atelectasis. 5. Small amount of gas noted within the dependent portion of the bladder. Correlate for any clinical signs or symptoms of cystitis. 6. Aortic Atherosclerosis ?  ?  ?Significant microbiology data: ?None ?  ?Procedures: ?None ?  ?Consults: ?General surgery ? ?  Hospital Course  ? ? ?Perforated diverticulitis-s/p exploratory laparotomy-right hemicolectomy/sigmoid colectomy with end ostomy on 4/18: Stable-General surgery following and directing care- was on Zosyn + liquid stool in ostomy bag.  Tolerating soft diet and clinically improving including leukocyte count, repeat CT on 11/01/2021 noted with questionable multiple areas of fluid collection Case was discussed with general surgery clinically much improved discharge home on 7 days of oral antibiotics with close outpatient general surgery follow-up, wife and family talked about wound care and ostomy care supplies provided by general surgery. ?  ?Acute hypoxic respiratory  failure: Likely due to atelectasis/possible PNA-on Zosyn.  CTA stable negative for PE, this problem has resolved with supportive care and mostly with pulmonary toiletry.  Stable and symptom-free on room air for several days. ?  ?HTN: BP gradually improving for now as needed IV hydralazine. ? ?HLD: Resume fenofibrate/statin  ? ?CAD s/p CABG approximately 10 years ago: No anginal symptoms-resume aspirin/statin ?  ?GERD: Continue PPI ?  ?Anxiety: Continue as needed Klonopin ?  ?Insomnia: Continue as needed temazepam ?  ?Hyponatremia.  Due to dehydration improved with IV fluids. ?  ?OSA: CPAP nightly, noncompliant strictly counseled.  Requested to get home CPAP machine on 10/29/2021 for better complaince. ?  ?PICC line in place.  Right arm, will be removed prior to discharge. ?  ?DM-2 (A1c 6.0 on 4/14): Diet controlled ? ?Discharge diagnosis   ? ? ?Principal Problem: ?  Colonic diverticular abscess ?Active Problems: ?  Anxiety disorder ?  Coronary atherosclerosis of native coronary artery ?  Essential hypertension ?  Gastroesophageal reflux disease without esophagitis ?  Malnutrition of moderate degree ? ? ? ?Discharge instructions   ? ?Discharge Instructions   ? ? Diet - low sodium heart healthy   Complete by: As directed ?  ? Discharge instructions   Complete by: As directed ?  ? Follow with Primary MD [facility] in 7 days  ? ?Get CBC, CMP, 2 view Chest X ray -  checked next visit within 1 week by Primary MD  ? ?Activity: As tolerated with Full fall precautions use walker/cane & assistance as needed ? ?Disposition Home  ? ?Diet: Heart Healthy   ? ?Special Instructions: If you have smoked or chewed Tobacco  in the last 2 yrs please stop smoking, stop any regular Alcohol  and or any Recreational drug use. ? ?On your next visit with your primary care physician please Get Medicines reviewed and adjusted. ? ?Please request your Prim.MD to go over all Hospital Tests and Procedure/Radiological results at the follow  up, please get all Hospital records sent to your Prim MD by signing hospital release before you go home. ? ?If you experience worsening of your admission symptoms, develop shortness of breath, life threatening emergency, suicidal or homicidal thoughts you must seek medical attention immediately by calling 911 or calling your MD immediately  if symptoms less severe. ? ?You Must read complete instructions/literature along with all the possible adverse reactions/side effects for all the Medicines you take and that have been prescribed to you. Take any new Medicines after you have completely understood and accpet all the possible adverse reactions/side effects.  ? Discharge wound care:   Complete by: As directed ?  ? Abdominal postop wound care and colostomy care as instructed by general surgery  ? Increase activity slowly   Complete by: As directed ?  ? ?  ? ? ?Discharge Medications  ? ?Allergies as of 11/04/2021   ?No Known Allergies ?  ? ?  ?  Medication List  ?  ? ?STOP taking these medications   ? ?amLODipine 10 MG tablet ?Commonly known as: NORVASC ?  ?lisinopril 40 MG tablet ?Commonly known as: ZESTRIL ?  ?metoprolol succinate 100 MG 24 hr tablet ?Commonly known as: TOPROL-XL ?  ? ?  ? ?TAKE these medications   ? ?acetaminophen 500 MG tablet ?Commonly known as: TYLENOL ?Take 1,000 mg by mouth every 6 (six) hours as needed for moderate pain or headache. ?  ?amoxicillin-clavulanate 875-125 MG tablet ?Commonly known as: AUGMENTIN ?Take 1 tablet by mouth every 12 (twelve) hours. ?  ?artificial tears Oint ophthalmic ointment ?Place into the right eye every 4 (four) hours as needed for dry eyes. ?What changed:  ?how much to take ?how to take this ?  ?aspirin EC 81 MG tablet ?Take 1 tablet (81 mg total) by mouth daily. ?What changed: when to take this ?  ?carvedilol 3.125 MG tablet ?Commonly known as: COREG ?Take 1 tablet (3.125 mg total) by mouth 2 (two) times daily with a meal. ?  ?clonazePAM 0.5 MG tablet ?Commonly  known as: KLONOPIN ?Take 0.25 mg by mouth 2 (two) times daily as needed for anxiety. ?  ?feeding supplement Liqd ?Take 237 mLs by mouth 2 (two) times daily between meals. ?  ?fenofibrate 160 MG tablet ?TAKE 1 TABLET(160 M

## 2021-11-04 NOTE — Progress Notes (Signed)
PT Cancellation Note ? ?Patient Details ?Name: Robert Barrett ?MRN: [id number] ?DOB: Dec 15, 1945 ? ? ?Cancelled Treatment:    Reason Eval/Treat Not Completed: Other (comment) Pt reports he is going to be discharged and does not feel he needs to practice any other mobility tasks. Will follow up if pt remains in hospital.  ? ?Farley Ly, PT, DPT  ?Acute Rehabilitation Services  ?Pager: [phone] ?Office: [phone] ? ? ? ?[name] ?11/04/2021, 10:48 AM ?

## 2021-11-04 NOTE — Progress Notes (Signed)
RA DL PICC removed per protocol per MD order. Manual pressure applied for 5 mins. Vaseline gauze, gauze, and Tegaderm applied over insertion site. No bleeding or swelling noted. Instructed patient to remain in bed for thirty mins. Educated patient about S/S of infection and when to call MD; no heavy lifting or pressure on right side for 24 hours; keep dressing dry and intact for 24 hours. Pt verbalized comprehension. 

## 2021-11-04 NOTE — Progress Notes (Signed)
Occupational Therapy Treatment ?Patient Details ?Name: Robert Barrett ?MRN: [id number] ?DOB: [birth year] ?Today's Date: 11/04/2021 ? ? ?History of present illness 76 y.o. male presented to the hospital 10/20/21 with abdominal pain. CT scan of his abdomen revealed perforated diverticulitis with a 6 cm abscess. 4/18 s/p Exploratory laparotomy, right hemicolectomy, sigmoid colectomy with end colostomy    PMH significant of DM-2, HTN, HLD, CAD s/p CABG repeat CT scan was performed on 4/18 right hemicolectomy, sigmoid colectomy and end ostomy placement ?  ?OT comments ? Pt. Seen for skilled OT treatment.  Focus on session was bed mobility simulating home env. In preparation for home and ub/lb dressing.  Pt. Moving well.  Able to complete bed mobility mod I and ub/lb dressing set up.   ? ?Recommendations for follow up therapy are one component of a multi-disciplinary discharge planning process, led by the attending physician.  Recommendations may be updated based on patient status, additional functional criteria and insurance authorization. ?   ?Follow Up Recommendations ? No OT follow up  ?  ?Assistance Recommended at Discharge PRN  ?Patient can return home with the following ? A little help with bathing/dressing/bathroom;Assistance with cooking/housework;Assist for transportation;Help with stairs or ramp for entrance ?  ?Equipment Recommendations ? None recommended by OT  ?  ?Recommendations for Other Services   ? ?  ?Precautions / Restrictions Precautions ?Precautions: Fall ?Precaution Comments: RLQ JP drain ?Restrictions ?Weight Bearing Restrictions: No  ? ? ?  ? ?Mobility Bed Mobility ?Overal bed mobility: Modified Independent ?Bed Mobility: Rolling, Sidelying to Sit, Sit to Sidelying ?Rolling: Modified independent (Device/Increase time) ?Sidelying to sit: Modified independent (Device/Increase time), HOB elevated ?  ?  ?Sit to sidelying: Modified independent (Device/Increase time) ?General bed mobility comments: had pt.  perform in/out of bed using log roll with hob flat in prep for home. he had originally said he was going to tie a rope to the ceiling and pull on it. after he completed safe log roll in/out he verbalized that he realized he does not need that modification. also educated not to pull on wife in/out of bed as he was able to use his ues to push through ?  ? ?Transfers ?Overall transfer level: Modified independent ?Equipment used: None ?Transfers: Sit to/from Stand, Bed to chair/wheelchair/BSC ?Sit to Stand: Modified independent (Device/Increase time) ?  ?  ?Step pivot transfers: Supervision ?  ?  ?  ?  ?  ?Balance   ?  ?  ?  ?  ?  ?  ?  ?  ?  ?  ?  ?  ?  ?  ?  ?  ?  ?  ?   ? ?ADL either performed or assessed with clinical judgement  ? ?ADL Overall ADL's : Needs assistance/impaired ?  ?  ?  ?  ?  ?  ?  ?  ?Upper Body Dressing : Set up;Sitting ?  ?Lower Body Dressing: Set up;Sitting/lateral leans;Sit to/from stand ?Lower Body Dressing Details (indicate cue type and reason): donned jeans without assistance ?  ?  ?  ?  ?  ?  ?Functional mobility during ADLs: Supervision/safety ?General ADL Comments: able to complete ub/lb dressing in his personal clothes in preparation for d/c home ?  ? ?Extremity/Trunk Assessment   ?  ?  ?  ?  ?  ? ?Vision   ?  ?  ?Perception   ?  ?Praxis   ?  ? ?Cognition Arousal/Alertness: Awake/alert ?Behavior During Therapy: [facility] for tasks assessed/performed ?  Overall Cognitive Status: Within Functional Limits for tasks assessed ?  ?  ?  ?  ?  ?  ?  ?  ?  ?  ?  ?  ?  ?  ?  ?  ?  ?  ?  ?   ?Exercises   ? ?  ?Shoulder Instructions   ? ? ?  ?General Comments    ? ? ?Pertinent Vitals/ Pain       Pain Assessment ?Pain Assessment: No/denies pain ? ?Home Living   ?  ?  ?  ?  ?  ?  ?  ?  ?  ?  ?  ?  ?  ?  ?  ?  ?  ?  ? ?  ?Prior Functioning/Environment    ?  ?  ?  ?   ? ?Frequency ? Min 2X/week  ? ? ? ? ?  ?Progress Toward Goals ? ?OT Goals(current goals can now be found in the care plan section) ? Progress  towards OT goals: Progressing toward goals ? ?   ?Plan Discharge plan remains appropriate   ? ?Co-evaluation ? ? ?   ?  ?  ?  ?  ? ?  ?AM-PAC OT "6 Clicks" Daily Activity     ?Outcome Measure ? ? Help from another person eating meals?: None ?Help from another person taking care of personal grooming?: None ?Help from another person toileting, which includes using toliet, bedpan, or urinal?: A Little ?Help from another person bathing (including washing, rinsing, drying)?: A Little ?Help from another person to put on and taking off regular upper body clothing?: None ?Help from another person to put on and taking off regular lower body clothing?: A Little ?6 Click Score: 21 ? ?  ?End of Session   ? ?OT Visit Diagnosis: Unsteadiness on feet (R26.81);Muscle weakness (generalized) (M62.81) ?  ?Activity Tolerance Patient tolerated treatment well ?  ?Patient Left in chair;with call bell/phone within reach;with family/visitor present ?  ?Nurse Communication  Alerted rn of spouses questions regarding ostomy education and and hh they will be receiving at home ?  ? ?   ? ?Time: [id number] ?OT Time Calculation (min): 13 min ? ?Charges: OT General Charges ?$OT Visit: 1 Visit ?OT Treatments ?$Self Care/Home Management : 8-22 mins ? ?Sonia Baller, COTA/L ?Acute Rehabilitation ?[phone]  ? ?Tanya Nones ?11/04/2021, 12:19 PM ?

## 2021-11-04 NOTE — Plan of Care (Signed)

## 2021-11-04 NOTE — Consult Note (Signed)
WOC Nurse ostomy follow up ?Went to see patient again before discharge. Changed pouch yesterday and it is in place and not leaking, therefore not changed today. Pouch was emptied and reviewed this process and that it would need to be emptied several times a day. Let gas out by opening bottom of pouch. Ostomy clinic has openings for Monday PM and Tues AM or PM. Trying to get him an appointment soon. Pouch should be ok until then. 5 convex clear front pouches sent home with patient with barrier rings and no sting barrier wipes. [facility]) is to come out but not sure of how often or when they will visit the first time. ?[facility] ostomy catalog given to the spouse.  ? ?Thank you for the consult. WOC nurse will not follow at this time.   ?Please re-consult the WOC team if needed. ? ?Renaldo Reel. Katrinka Blazing, MSN, RN, CMSRN, AGCNS, WTA ?Wound Treatment Associate ?Pager [phone]   ?

## 2021-11-04 NOTE — Progress Notes (Signed)
[facility] ?Progress Note ? ?10 Days Post-Op  ?Subjective: ?CC-  ?Comfortable. Tolerating diet and colostomy functioning.  ?WBC down 14.5, afebrile ? ?Objective: ?Vital signs in last 24 hours: ?Temp:  [97.5 ?F (36.4 ?C)-97.7 ?F (36.5 ?C)] 97.5 ?F (36.4 ?C) (04/28 [id number]) ?Pulse Rate:  [78-87] 86 (04/28 0811) ?Resp:  [14-24] 24 (04/28 [id number]) ?BP: (96-139)/(63-88) 126/71 (04/28 [id number]) ?SpO2:  [93 %-96 %] 93 % (04/28 0811) ?Last BM Date : 11/04/21 ? ?Intake/Output from previous day: ?04/27 0701 - 04/28 0700 ?In: -  ?Out: 200 [Urine:200] ?Intake/Output this shift: ?No intake/output data recorded. ? ?PE: ?Gen:  Alert, NAD, pleasant ?Pulm: rate and effort normal ?Abd: soft, minimal distension, nontender, open midline pale pink with increased fibrinous exudate at the base (no cellulitis or purulent drainage), some superficial dehiscence without evisceration; ostomy retracted/ air and stool in bag  ? ?Lab Results:  ?Recent Labs  ?  11/03/21 ?0425 11/04/21 ?[id number]  ?WBC 16.5* 14.5*  ?HGB 10.6* 10.5*  ?HCT 31.7* 31.4*  ?PLT 650* 662*  ? ?BMET ?Recent Labs  ?  11/02/21 ?0418 11/03/21 ?0425  ?NA 129* 130*  ?K 4.2 4.2  ?CL 100 101  ?CO2 21* 22  ?GLUCOSE 133* 140*  ?BUN 13 9  ?CREATININE 0.63 0.64  ?CALCIUM 9.1 8.8*  ? ?PT/INR ?No results for input(s): LABPROT, INR in the last 72 hours. ?CMP  ?   ?Component Value Date/Time  ? NA 130 (L) 11/03/2021 0425  ? NA 137 01/19/2021 0000  ? K 4.2 11/03/2021 0425  ? CL 101 11/03/2021 0425  ? CO2 22 11/03/2021 0425  ? GLUCOSE 140 (H) 11/03/2021 0425  ? BUN 9 11/03/2021 0425  ? BUN 6 01/19/2021 0000  ? CREATININE 0.64 11/03/2021 0425  ? CREATININE 1.17 04/13/2016 1422  ? CALCIUM 8.8 (L) 11/03/2021 0425  ? PROT 5.0 (L) 10/31/2021 0335  ? ALBUMIN 1.7 (L) 10/31/2021 0335  ? AST 32 10/31/2021 0335  ? ALT 18 10/31/2021 0335  ? ALKPHOS 76 10/31/2021 0335  ? BILITOT 0.5 10/31/2021 0335  ? GFRNONAA >60 11/03/2021 0425  ? GFRNONAA 62 04/13/2016 1422  ? GFRAA 72 04/13/2016 1422  ? ?Lipase  ?    ?Component Value Date/Time  ? LIPASE 28 10/20/2021 0852  ? ? ? ? ? ?Studies/Results: ?No results found. ? ?Anti-infectives: ?Anti-infectives (From admission, onward)  ? ? Start     Dose/Rate Route Frequency Ordered Stop  ? 11/04/21 0000  amoxicillin-clavulanate (AUGMENTIN) 875-125 MG tablet       ? 1 tablet Oral Every 12 hours 11/04/21 1016    ? 11/01/21 1245  amoxicillin-clavulanate (AUGMENTIN) 875-125 MG per tablet 1 tablet       ? 1 tablet Oral Every 12 hours 11/01/21 1147    ? 10/20/21 2300  piperacillin-tazobactam (ZOSYN) IVPB 3.375 g       ? 3.375 g ?12.5 mL/hr over 240 Minutes Intravenous Every 8 hours 10/20/21 1854 10/30/21 2359  ? 10/20/21 1700  piperacillin-tazobactam (ZOSYN) IVPB 3.375 g       ? 3.375 g ?100 mL/hr over 30 Minutes Intravenous  Once 10/20/21 1656 10/20/21 1745  ? ?  ? ? ? ?Assessment/Plan ?-POD#10 s/p Exploratory laparotomy, right hemicolectomy, sigmoid colectomy with end colostomy 4/18 Dr. Kae Heller ?- intraop findings: Large bowel obstruction with perforated cecum and diffuse feculent peritonitis, sigmoid diverticulitis with dense inflammatory adhesions to the bladder as well as associated localized abscess ?- path diverticulitis sigmoid with abscess, necrosis of cecum- all benign ?- CT scan 4/25 with  multiple small fluid collections concerning for developing abscesses. Nothing drainable by IR. Restarted oral antibiotics (augmentin) 4/25.  ?- d/c JP drain 4/26  ?- BID dry dressing changes to help with fibrinous exudate ?- add abdominal binder ?- WOC following for new colostomy ?- WBC trending down on augmentin. Patient clinically improving. [place] for discharge today from surgical standpoint. Recommend 7 more days of augmentin. Follow up on AVS. I will send rx for pain medication. ?  ?ID - zosyn 4/13>>4/23, augmentin 4/25>> ?FEN - soft diet, Ensure ?VTE - lovenox ?Foley - dc 4/21, out and voiding ?  ?ABL anemia ?T2DM ?HTN ?HLD ?CAD s/p CABG ?GERD ?Anxiety ?OSA ? ? ? LOS: 15 days  ? ? ?Wellington Hampshire, PA-C ?[facility] ?11/04/2021, 10:19 AM ?Please see Amion for pager number during day hours 7:00am-4:30pm ? ?

## 2021-11-04 NOTE — TOC Transition Note (Addendum)
Transition of Care (TOC) - CM/SW Discharge Note ? ? ?Patient Details  ?Name: Robert Barrett ?MRN: [id number] ?Date of Birth: June 23, 1946 ? ?Transition of Care (TOC) CM/SW Contact:  ?Verdell Carmine, RN ?Phone Number: ?11/04/2021, 10:15 AM ? ? ?Clinical Narrative:    ? ?Patient will transition home today. Will have home health RN for assistance with dressing changes.per general surgery. No other DME needed, off oxygen.  ? Called [facility] to see when they can be seen. Wife is voicing concern at dressing changes and ostomy. She stated to Nursing that ostomy supplies sent were not correct at home. RN spoke to Surgery and primary. WOC will come see patient and coordinate with surgery so the correct supplies are available. ?Called [facility], left message regarding authorization, as Alvis Lemmings has not received authorization yet from [place].  ? Called [facility] again to get authorization for Home Health left another message with [facility] ?Ccaitlin called back from [place], patient is seen in [place] [phone] ext [phone] to leave a message with Lavella Lemons to call this RNCM ASAP. Messaged RN about delay in Auth ? ?[place] back spoke to josie x [phone] She stated that there was no authorization yet, but someone would call me back asap to see where everything is set. ? ?Final next level of care: [place] ?Barriers to Discharge: No Barriers Identified ? ? ?Patient Goals and CMS Choice ?Patient states their goals for this hospitalization and ongoing recovery are:: return home ?CMS Medicare.gov Compare Post Acute Care list provided to:: Patient ?Choice offered to / list presented to : Patient ? ?Discharge Placement ?  ?          Home with Home Health ?  ?  ?  ?  ? ?Discharge Plan and Services ?  ?Discharge Planning Services: CM Consult ?Post Acute Care Choice: Home Health          ?  ?  ?  ?  ?  ?HH Arranged: RN ?[place] Agency: [facility] ?Date HH Agency Contacted: 10/31/21 ?Time [facility]: [id number] ?Representative spoke with at [place]: Tommi Rumps ? ?Social Determinants of Health (SDOH) Interventions ?  ? ? ?Readmission Risk Interventions ?   ? View : No data to display.  ?  ?  ?  ? ? ? ? ? ?

## 2021-11-08 ENCOUNTER — Ambulatory Visit (HOSPITAL_COMMUNITY)
Admission: RE | Admit: 2021-11-08 | Discharge: 2021-11-08 | Disposition: A | Discharge status: Home / Self Care | Payer: Medicare Other | Source: Ambulatory Visit | Attending: Nurse Practitioner | Admitting: Nurse Practitioner

## 2021-11-08 DIAGNOSIS — L24B1 Irritant contact dermatitis related to digestive stoma or fistula: Secondary | ICD-10-CM | POA: Diagnosis not present

## 2021-11-08 DIAGNOSIS — T8189XD Other complications of procedures, not elsewhere classified, subsequent encounter: Secondary | ICD-10-CM | POA: Diagnosis not present

## 2021-11-08 DIAGNOSIS — L24B3 Irritant contact dermatitis related to fecal or urinary stoma or fistula: Secondary | ICD-10-CM | POA: Insufficient documentation

## 2021-11-08 DIAGNOSIS — K9400 Colostomy complication, unspecified: Secondary | ICD-10-CM

## 2021-11-08 DIAGNOSIS — R21 Rash and other nonspecific skin eruption: Secondary | ICD-10-CM | POA: Diagnosis not present

## 2021-11-08 DIAGNOSIS — T8189XA Other complications of procedures, not elsewhere classified, initial encounter: Secondary | ICD-10-CM | POA: Insufficient documentation

## 2021-11-08 DIAGNOSIS — Z433 Encounter for attention to colostomy: Secondary | ICD-10-CM | POA: Diagnosis not present

## 2021-11-08 DIAGNOSIS — Y833 Surgical operation with formation of external stoma as the cause of abnormal reaction of the patient, or of later complication, without mention of misadventure at the time of the procedure: Secondary | ICD-10-CM | POA: Diagnosis not present

## 2021-11-08 DIAGNOSIS — R109 Unspecified abdominal pain: Secondary | ICD-10-CM | POA: Insufficient documentation

## 2021-11-08 NOTE — Discharge Instructions (Signed)
Switched to pouch with filter ?Use powder and skin prep around stoma (and  in the separation) ?Wound care  Dry gauze twice daily, cover and tape.  ?Given samples ?I will set up with [facility] regarding ostomy supplies.  ? ?

## 2021-11-08 NOTE — Progress Notes (Signed)
[facility]  ? ?Reason for visit:  ?LUQ colostomy ?Midline abdominal wound, increased drainage ?HPI:  ?Colonic diverticular abscess with Hartmann procedure, LLQ colostomy ?ROS  ?Review of Systems  ?Gastrointestinal:  Positive for abdominal pain.  ?     Open surgical wound, increased drainage, per wife. They have follow up scheduled.  No increase in pain, fever  ?Skin:  Positive for rash and wound.  ?     Contact dermatitis to peristomal skin  ?All other systems reviewed and are negative. ?Vital signs:  ?BP 131/79   Pulse 95   Temp 98.3 ?F (36.8 ?C)   Resp 19   SpO2 97%  ?Exam:  ?Physical Exam ?Vitals reviewed.  ?Abdominal:  ?   Palpations: Abdomen is soft.  ?   Comments: Open surgical wound  ?Neurological:  ?   Mental Status: He is alert and oriented to person, place, and time.  ?Psychiatric:     ?   Mood and Affect: Mood normal.  ?  ?Stoma type/location: LUQ colostomy  1 3/8"  ?Stomal assessment/size:  pink and moist  productive, circumferential stomal separation ?Peristomal assessment:  erythematous and tender to touch ?Treatment options for stomal/peristomal skin: barrier ring  filtered pouch, 1 piece convex  ?Added stoma powder and skin prep,  ?Output: soft brown stool ?Ostomy pouching: 1pc.convex ?Education provided:  Rationale for powder and skin prep.  Explained separation and management  ? ?  ?Impression/dx  ?Contact dermatitis ?Stomal separation ?Colostomy ?Nonhealing surgical wound ?Discussion  ?See back in one week ?Plan  ?Samples sent home for changed pouch technique ?Will set up with [facility]/ ? ?[facility] Nurse wound follow up ?Wound type:midline abdominal, surgical nonhealing ?[facility] has still not approved [place] for patient.  ?Measurement: 26 cm x 12 cm x 3 cm  ?Wound bed: 30% yellow fibrin 10% blue sutures 60% beefy red ?Drainage (amount, consistency, odor) moderate creamy tan effluent (Photo in chart)  ?Periwound:LUQ colostomy with stomal separation ?Dressing procedure/placement/frequency:  Continue to fill defect with dry gauze and cover with ABD pad and tape BID  additional supplies given to patient today.  ?  ? ? ? ?Visit time: 75 minutes.  ? ?Domenic Moras FNP-BC ? ?  ?

## 2021-11-11 ENCOUNTER — Other Ambulatory Visit (HOSPITAL_COMMUNITY): Payer: Self-pay

## 2021-11-14 ENCOUNTER — Other Ambulatory Visit (HOSPITAL_COMMUNITY): Payer: Self-pay | Admitting: Nurse Practitioner

## 2021-11-14 DIAGNOSIS — K631 Perforation of intestine (nontraumatic): Secondary | ICD-10-CM

## 2021-11-14 DIAGNOSIS — K9400 Colostomy complication, unspecified: Secondary | ICD-10-CM

## 2021-11-15 ENCOUNTER — Ambulatory Visit (HOSPITAL_COMMUNITY): Payer: No Typology Code available for payment source

## 2021-11-16 ENCOUNTER — Other Ambulatory Visit: Payer: Self-pay | Admitting: Interventional Cardiology

## 2021-12-08 ENCOUNTER — Other Ambulatory Visit: Payer: Self-pay | Admitting: Interventional Cardiology

## 2021-12-17 ENCOUNTER — Other Ambulatory Visit: Payer: Self-pay | Admitting: Interventional Cardiology

## 2021-12-20 MED ORDER — FENOFIBRATE 160 MG PO TABS: ORAL_TABLET | ORAL | 0 refills | Status: DC

## 2021-12-20 NOTE — Addendum Note (Signed)
Addended by: Margaret Pyle D on: 12/20/2021 09:38 AM   Modules accepted: Orders

## 2022-01-02 ENCOUNTER — Encounter (HOSPITAL_COMMUNITY)
Admission: RE | Admit: 2022-01-02 | Discharge: 2022-01-02 | Disposition: A | Discharge status: Home / Self Care | Payer: Medicare Other | Source: Ambulatory Visit | Attending: Surgery | Admitting: Surgery

## 2022-01-02 DIAGNOSIS — Z933 Colostomy status: Secondary | ICD-10-CM | POA: Diagnosis not present

## 2022-01-02 DIAGNOSIS — Z433 Encounter for attention to colostomy: Secondary | ICD-10-CM | POA: Diagnosis not present

## 2022-01-02 DIAGNOSIS — T8189XD Other complications of procedures, not elsewhere classified, subsequent encounter: Secondary | ICD-10-CM | POA: Diagnosis not present

## 2022-01-02 DIAGNOSIS — Z01812 Encounter for preprocedural laboratory examination: Secondary | ICD-10-CM | POA: Diagnosis not present

## 2022-01-02 DIAGNOSIS — L24B3 Irritant contact dermatitis related to fecal or urinary stoma or fistula: Secondary | ICD-10-CM

## 2022-01-02 DIAGNOSIS — Y828 Other medical devices associated with adverse incidents: Secondary | ICD-10-CM | POA: Insufficient documentation

## 2022-01-02 DIAGNOSIS — T8189XA Other complications of procedures, not elsewhere classified, initial encounter: Secondary | ICD-10-CM | POA: Diagnosis not present

## 2022-01-02 NOTE — Progress Notes (Signed)
[facility]   Reason for visit:  LLQ colostomy with nonhealing surgical wound. Wife present has been performing most of ostomy pouch changes  HPI:  Robert Barrett with LLQ colostomy  nonhealing surgical wound ROS  Review of Systems  Gastrointestinal:        LLQ colostomy  Skin:        Midline surgical wound, nonhealing Peristomal breakdown, contact dermatitis  Psychiatric/Behavioral:  The patient is nervous/anxious.        States he will not likely undergo reversal due to age.    All other systems reviewed and are negative.  Vital signs:  BP 137/81 (BP Location: Right Arm)   Pulse 82   Temp 97.6 F (36.4 C) (Oral)   Resp 20   SpO2 97%  Exam:  Physical Exam Vitals reviewed.  Constitutional:      Appearance: Normal appearance.  Abdominal:     Palpations: Abdomen is soft.     Comments: Nonhealing surgical wound with green drainage  Neurological:     Mental Status: He is alert and oriented to person, place, and time.  Psychiatric:        Behavior: Behavior normal.     Stoma type/location:  LLQ colostomy Stomal assessment/size:  1", slightly oval Peristomal assessment:  midline abdominal wound Some hair noted.  Patient instructed to clip this hair or could perform a dry powder shave to abdominal hair in peristomal as well as surgical site periwound area.  There is partial thickness tissue loss at 4 o'clock where pouch was leaking.  He reports that he was feeling itching and discomfort to this area.  I explained that when he feels that under the pouch he needs to change it as he probably has a leak. The area is tender.  Will protect with powder and skin prep as well as barrier ring.  Treatment options for stomal/peristomal skin: Stoma powder and skin prep, barrier ring and 1 piece convex pouch Output: soft brown stool Ostomy pouching: 1pc.convex with barrier ring Education provided:  Wife is still very anxious to perform pouch changes and they have been relying on [facility]  nurse.  I let her perform pouch change today with my guidance.  She has some noted visual issues but patient does not.  I suggest he cut the barrier openings and she can perform the pouch change.  He can stand in front of bathroom mirror to line up the pouch to the stoma if she cannot see well enough.   We discuss the importance that they gain independence with pouch care so they can manage care and unscheduled leaks.  WOC Nurse wound follow up Wound type:midline abdominal wound, surgical, nonhealing.  Measurement: 8.3 cm x 15 cm x 0.7 cm  Wound bed: Beefy red, friable Drainage (amount, consistency, odor) moderate green tinged effluent.  Dressing is changed once daily by wife and is saturated this AM. Musty odor Periwound:LLQ colostomy Dressing procedure/placement/frequency: NS moist kerlix daily.  Requesting to switch to Aquacel Ag three times weekly.        Impression/dx  Contact dermatitis Colostomy Nonhealing surgical wound Discussion  Need for patient and wife to gain independence with ostomy care.  Request to change wound care orders with Dr Fredricka Bonine (via nurse, Toniann Fail)  Plan  Message left with Toniann Fail regarding switch to Aquacel Ag to midline wound Mon/Wed/Fri.      Visit time: 70 minutes.   Maple Hudson FNP-BC

## 2022-01-13 ENCOUNTER — Encounter (HOSPITAL_COMMUNITY): Payer: Self-pay | Admitting: Nurse Practitioner

## 2022-01-20 ENCOUNTER — Encounter (HOSPITAL_COMMUNITY): Payer: Self-pay | Admitting: Nurse Practitioner

## 2022-01-31 ENCOUNTER — Encounter (HOSPITAL_COMMUNITY): Payer: Self-pay | Admitting: Nurse Practitioner

## 2022-02-23 DIAGNOSIS — Z09 Encounter for follow-up examination after completed treatment for conditions other than malignant neoplasm: Secondary | ICD-10-CM | POA: Diagnosis not present

## 2022-02-23 DIAGNOSIS — Z933 Colostomy status: Secondary | ICD-10-CM | POA: Diagnosis not present

## 2022-04-26 DIAGNOSIS — Z09 Encounter for follow-up examination after completed treatment for conditions other than malignant neoplasm: Secondary | ICD-10-CM | POA: Diagnosis not present

## 2022-04-26 DIAGNOSIS — Z433 Encounter for attention to colostomy: Secondary | ICD-10-CM | POA: Diagnosis not present

## 2022-04-26 DIAGNOSIS — Z4889 Encounter for other specified surgical aftercare: Secondary | ICD-10-CM | POA: Diagnosis not present

## 2022-06-29 DIAGNOSIS — Z933 Colostomy status: Secondary | ICD-10-CM | POA: Diagnosis not present

## 2022-06-29 DIAGNOSIS — Z4889 Encounter for other specified surgical aftercare: Secondary | ICD-10-CM | POA: Diagnosis not present

## 2022-06-29 DIAGNOSIS — Z433 Encounter for attention to colostomy: Secondary | ICD-10-CM | POA: Diagnosis not present

## 2022-09-02 ENCOUNTER — Ambulatory Visit (INDEPENDENT_AMBULATORY_CARE_PROVIDER_SITE_OTHER): Payer: No Typology Code available for payment source

## 2022-09-02 ENCOUNTER — Encounter (HOSPITAL_COMMUNITY): Payer: Self-pay

## 2022-09-02 ENCOUNTER — Ambulatory Visit (HOSPITAL_COMMUNITY)
Admission: EM | Admit: 2022-09-02 | Discharge: 2022-09-02 | Disposition: A | Discharge status: Home / Self Care | Payer: No Typology Code available for payment source | Attending: Family Medicine | Admitting: Family Medicine

## 2022-09-02 DIAGNOSIS — M79671 Pain in right foot: Secondary | ICD-10-CM

## 2022-09-02 DIAGNOSIS — I10 Essential (primary) hypertension: Secondary | ICD-10-CM | POA: Diagnosis not present

## 2022-09-02 DIAGNOSIS — S9031XA Contusion of right foot, initial encounter: Secondary | ICD-10-CM | POA: Diagnosis not present

## 2022-09-02 DIAGNOSIS — T148XXA Other injury of unspecified body region, initial encounter: Secondary | ICD-10-CM

## 2022-09-02 MED ORDER — DOXYCYCLINE HYCLATE 100 MG PO CAPS: 100.0000 mg | ORAL_CAPSULE | Freq: Two times a day (BID) | ORAL | 0 refills | Status: DC

## 2022-09-02 NOTE — ED Triage Notes (Signed)
Pt is here for foot pain with pain and swelling x 1day

## 2022-09-02 NOTE — Discharge Instructions (Signed)
Begin the antibiotic prescribed.  Your blood pressure was noted to be elevated during your visit today. If you are currently taking medication for high blood pressure, please ensure you are taking this as directed. If you do not have a history of high blood pressure and your blood pressure remains persistently elevated, you may need to begin taking a medication at some point. You may return here within the next few days to recheck if unable to see your primary care provider or if you do not have a one.  BP (!) 176/115 (BP Location: Left Arm)   Pulse 80   Temp 98.3 F (36.8 C) (Oral)   Resp 16   SpO2 96%   BP Readings from Last 3 Encounters:  09/02/22 (!) 176/115  01/02/22 137/81  11/08/21 131/79

## 2022-09-06 NOTE — ED Provider Notes (Addendum)
[name]   [id number] 09/02/22 Arrival Time: [id number]  ASSESSMENT & PLAN:  1. Contusion of foot or heel, right, initial encounter   2. Right foot pain   3. Hematoma   4. Elevated blood pressure reading with diagnosis of hypertension     I have personally viewed the imaging studies ordered this visit. Mild STS around heel. No bony changes.  Procedure: Hematoma Drainage: (ignore laceration repair title below) Laceration Repair  Date/Time: 09/06/2022 9:14 AM  Performed by: Vanessa Kick, MD Authorized by: Vanessa Kick, MD   Consent:    Consent obtained:  Verbal   Consent given by:  Patient   Risks, benefits, and alternatives were discussed: yes     Risks discussed:  Infection and pain   Alternatives discussed:  No treatment Universal protocol:    Procedure explained and questions answered to patient or proxy's satisfaction: yes     Site/side marked: yes     Patient identity confirmed:  Verbally with patient Anesthesia:    Anesthesia method:  Topical application   Topical anesthesia: PainEaze. Laceration details:    Location:  Foot   Foot location:  R heel Pre-procedure details:    Preparation:  Patient was prepped and draped in usual sterile fashion Exploration:    Limited defect created (wound extended): no     Hemostasis achieved with:  Direct pressure   Imaging outcome: foreign body not noted     Contaminated: no   Treatment:    Area cleansed with:  Povidone-iodine   Amount of cleaning:  Standard   Irrigation solution:  Sterile saline   Irrigation method:  Syringe   Visualized foreign bodies/material removed: no     Debridement:  None   Undermining:  None   Scar revision: no   Skin repair:    Repair method: none. Post-procedure details:    Dressing:  Non-adherent dressing   Procedure completion:  Tolerated Comments:     An approx 1 cm curved laceration made over heel hematoma. Thick blood drained with significant pain relief.  Approx  67m.  Wound care discussed. To prevent infection given location, begin: Discharge Medication List as of 09/02/2022  4:43 PM     START taking these medications   Details  doxycycline (VIBRAMYCIN) 100 MG capsule Take 1 capsule (100 mg total) by mouth 2 (two) times daily., Starting Sat 09/02/2022, Normal        Orders Placed This Encounter  Procedures   DG Foot Complete Right   Ambulatory referral to Podiatry    Work/school excuse note: not needed. Recommend:  Follow-up Information     Schedule an appointment as soon as possible for a visit  with [facility]   Specialty: General Practice Why: To recheck your blood pressure. Contact information: [phone]                 Discharge Instructions      Begin the antibiotic prescribed.  Your blood pressure was noted to be elevated during your visit today. If you are currently taking medication for high blood pressure, please ensure you are taking this as directed. If you do not have a history of high blood pressure and your blood pressure remains persistently elevated, you may need to begin taking a medication at some point. You may return here within the next few days to recheck if unable to see your primary care provider or if you do not have a one.  BP (Marland Kitchen  176/115 (BP Location: Left Arm)   Pulse 80   Temp 98.3 F (36.8 C) (Oral)   Resp 16   SpO2 96%   BP Readings from Last 3 Encounters:  09/02/22 (!) 176/115  01/02/22 137/81  11/08/21 131/79       Reviewed expectations re: course of current medical issues. Questions answered. Outlined signs and symptoms indicating need for more acute intervention. Patient verbalized understanding. After Visit Summary given.  SUBJECTIVE: History from: patient. Robert Barrett is a 77 y.o. male who reports pain over R heel; x 1-2 days; unsure of trauma, possibly hit on something; "feels swollen". No bleeding. No  extremity sensation changes or weakness. No tx PTA. Is a diabetic.  Increased blood pressure noted today. Reports that he is treated for HTN. Denies CP/SOB. Is taking medications.  Past Surgical History:  Procedure Laterality Date   CARDIAC SURGERY     CORONARY ARTERY BYPASS GRAFT     LAPAROTOMY N/A 10/25/2021   Procedure: EXPLORATORY LAPAROTOMY, [place] Procedure;  Surgeon: Clovis Riley, MD;  Location: [facility];  Service: General;  Laterality: N/A;      OBJECTIVE:  Vitals:   09/02/22 1530  BP: (!) 176/115  Pulse: 80  Resp: 16  Temp: 98.3 F (36.8 C)  TempSrc: Oral  SpO2: 96%    General appearance: alert; no distress HEENT: [place]; AT Neck: supple with FROM Resp: unlabored respirations Extremities: RLE: warm with well perfused appearance; approx 1 cm darkened circular fluctuant area over R heel; TTP; over R heel; without active bleeding or drainage; ankle ROM: normal CV: brisk extremity capillary refill of RLE; 2+ DP pulse of RLE. Skin: warm and dry; no visible rashes Neurologic: gait normal; normal sensation and strength of RLE Psychological: alert and cooperative; normal mood and affect  Imaging: DG Foot Complete Right  Result Date: 09/02/2022 CLINICAL DATA:  Acute RIGHT foot pain and swelling for 1 day. EXAM: RIGHT FOOT COMPLETE - 3 VIEW COMPARISON:  None Available. FINDINGS: There is no evidence of fracture or dislocation. There is no evidence of arthropathy or other focal bone abnormality. Apparent heel soft tissue swelling noted. IMPRESSION: Apparent heel soft tissue swelling. No acute bony abnormality. Electronically Signed   By: Margarette Canada M.D.   On: 09/02/2022 16:22      No Known Allergies  Past Medical History:  Diagnosis Date   Anxiety    CAD (coronary artery disease)    GERD (gastroesophageal reflux disease)    HLD (hyperlipidemia)    Hypertension    Insomnia    Sleep apnea    Type 2 diabetes mellitus (HCC)    Social History   Socioeconomic  History   Marital status: Married    Spouse name: Not on file   Number of children: 3   Years of education: Not on file   Highest education level: Not on file  Occupational History   Not on file  Tobacco Use   Smoking status: Never   Smokeless tobacco: Never  Substance and Sexual Activity   Alcohol use: Yes   Drug use: No   Sexual activity: Not on file  Other Topics Concern   Not on file  Social History Narrative   Not on file   Social Determinants of Health   Financial Resource Strain: Not on file  Food Insecurity: Not on file  Transportation Needs: Not on file  Physical Activity: Not on file  Stress: Not on file  Social Connections: Not on file   Family  History  Problem Relation Age of Onset   Cancer Mother        breast   Heart disease Father    Past Surgical History:  Procedure Laterality Date   CARDIAC SURGERY     CORONARY ARTERY BYPASS GRAFT     LAPAROTOMY N/A 10/25/2021   Procedure: EXPLORATORY [place] Procedure;  Surgeon: Clovis Riley, MD;  Location: [facility];  Service: General;  Laterality: Ginette Pitman, MD 09/06/22 [id number]    Vanessa Kick, MD 09/12/22 1026    Vanessa Kick, MD 09/12/22 1028

## 2022-09-07 ENCOUNTER — Ambulatory Visit: Payer: Medicare Other | Admitting: Podiatry

## 2022-09-07 DIAGNOSIS — S90821A Blister (nonthermal), right foot, initial encounter: Secondary | ICD-10-CM | POA: Diagnosis not present

## 2022-09-07 MED ORDER — DOXYCYCLINE HYCLATE 100 MG PO TABS: 100.0000 mg | ORAL_TABLET | Freq: Two times a day (BID) | ORAL | 0 refills | Status: AC

## 2022-09-07 NOTE — Progress Notes (Signed)
Subjective:  Patient ID: Robert Barrett, male    DOB: 08/07/1945,  MRN: [id number]  Chief Complaint  Patient presents with   Foot Pain    R heel pain/ blister     77 y.o. male presents with the above complaint.  Patient presents with right heel blister that has been present for quite some time is progressive gotten worse hurts with ambulation worse with pressure this came out of nowhere may have been some type issues.  There are some erythema associated is not taking antibiotics he has not seen MRIs prior to seeing me he would like to discuss treatment options   Review of Systems: Negative except as noted in the HPI. Denies N/V/F/Ch.  Past Medical History:  Diagnosis Date   Anxiety    CAD (coronary artery disease)    GERD (gastroesophageal reflux disease)    HLD (hyperlipidemia)    Hypertension    Insomnia    Sleep apnea    Type 2 diabetes mellitus (HCC)     Current Outpatient Medications:    doxycycline (VIBRA-TABS) 100 MG tablet, Take 1 tablet (100 mg total) by mouth 2 (two) times daily for 14 days., Disp: 28 tablet, Rfl: 0   acetaminophen (TYLENOL) 500 MG tablet, Take 1,000 mg by mouth every 6 (six) hours as needed for moderate pain or headache., Disp: , Rfl:    aspirin EC 81 MG tablet, Take 1 tablet (81 mg total) by mouth daily. (Patient taking differently: Take 81 mg by mouth every evening.), Disp: 90 tablet, Rfl: 3   carvedilol (COREG) 3.125 MG tablet, Take 1 tablet (3.125 mg total) by mouth 2 (two) times daily with a meal., Disp: 60 tablet, Rfl: 0   doxycycline (VIBRAMYCIN) 100 MG capsule, Take 1 capsule (100 mg total) by mouth 2 (two) times daily., Disp: 14 capsule, Rfl: 0   fenofibrate 160 MG tablet, TAKE 1 TABLET(160 MG) BY MOUTH DAILY, Disp: 15 tablet, Rfl: 0   HYDROcodone-acetaminophen (NORCO/VICODIN) 5-325 MG per tablet, Take 1 tablet by mouth every 6 (six) hours as needed for moderate pain., Disp: , Rfl:    Multiple Vitamins-Minerals (MULTIVITAMIN GUMMIES MENS)  CHEW, Chew 1 tablet by mouth every evening., Disp: , Rfl:    omeprazole (PRILOSEC) 20 MG capsule, Take 20 mg by mouth every evening., Disp: , Rfl:    oxyCODONE (OXY IR/ROXICODONE) 5 MG immediate release tablet, Take 0.5-1 tablets (2.5-5 mg total) by mouth every 6 (six) hours as needed for severe pain., Disp: 20 tablet, Rfl: 0   rosuvastatin (CRESTOR) 40 MG tablet, Take 40 mg by mouth every evening., Disp: , Rfl:    temazepam (RESTORIL) 15 MG capsule, Take 15-30 mg by mouth at bedtime., Disp: , Rfl:   Social History   Tobacco Use  Smoking Status Never  Smokeless Tobacco Never    No Known Allergies Objective:  There were no vitals filed for this visit. There is no height or weight on file to calculate BMI. Constitutional Well developed. Well nourished.  Vascular Dorsalis pedis pulses palpable bilaterally. Posterior tibial pulses palpable bilaterally. Capillary refill normal to all digits.  No cyanosis or clubbing noted. Pedal hair growth normal.  Neurologic Normal speech. Oriented to person, place, and time. Epicritic sensation to light touch grossly present bilaterally.  Dermatologic Right heel friction blister noted with serosanguineous fluid.  No purulent drainage noted no malodor present mild erythema noted.  Orthopedic: Normal joint ROM without pain or crepitus bilaterally. No visible deformities. No bony tenderness.   Radiographs: None  Assessment:   1. Heel blister, right, initial encounter    Plan:  Patient was evaluated and treated and all questions answered.  Right heel friction blister -All questions and concerns were discussed with the patient in extensive detail. -I discussed Betadine wet-to-dry dressing daily until resolve meant -Patient will benefit from doxycycline for 14 days to help with skin and soft tissue prophylaxis. -Patient agrees with plan like to proceed with taking antibiotics and doing dressing changes.  No follow-ups on file.

## 2022-10-27 ENCOUNTER — Ambulatory Visit: Payer: Medicare Other | Admitting: Podiatry

## 2023-05-06 NOTE — Progress Notes (Unsigned)
Cardiology Office Note:   Date:  05/09/2023  ID:  Robert Barrett, DOB 1945/11/17, MRN [id number] PCP:  [facility]  [facility] Providers Cardiologist:  Alverda Skeans, MD Referring MD: [facility]  Chief Complaint/Reason for Referral: Cardiology follow-up ASSESSMENT:    1. Atherosclerosis of native coronary artery of native heart with stable angina pectoris (HCC)   2. Hyperlipidemia LDL goal <70   3. Essential hypertension   4. Aortic atherosclerosis (HCC)   5. Obstructive sleep apnea   6. BMI 30.0-30.9,adult   7. Prediabetes     PLAN:   In order of problems listed above: Stable angina: Continue aspirin, Will change Coreg 3.125 mg twice daily to Toprol 12.5 mg at bedtime.  Prescribed as needed nitroglycerin. Hyperlipidemia: Will check lipid panel and LFTs; will stop Crestor at this time given myalgias.  He has previously been intolerant of atorvastatin and simvastatin.  Will refer to pharmacy for further recommendations. Hypertension: Start Micardis 20 mg daily and check a BMP in 1 week will obtain echocardiogram to evaluate further. Aortic atherosclerosis: Continue aspirin, statin, and strict blood pressure control. Obstructive sleep apnea: Continue CPAP Elevated BMI: Continue diet and exercise modification. Prediabetes: Hemoglobin A1c last month was 6.5.  Consider GLP-1 receptor agonist and SGLT2 inhibitor in the future depending on diabetic status.          Dispo:  Return in about 6 months (around 11/07/2023).      Medication Adjustments/Labs and Tests Ordered: Current medicines are reviewed at length with the patient today.  Concerns regarding medicines are outlined above.  The following changes have been made:     Labs/tests ordered: Orders Placed This Encounter  Procedures   Lipid Profile   Hepatic function panel   Basic Metabolic Panel (BMET)   AMB Referral to [facility]   EKG 12-Lead   ECHOCARDIOGRAM COMPLETE    Medication  Changes: Meds ordered this encounter  Medications   telmisartan (MICARDIS) 20 MG tablet    Sig: Take 1 tablet (20 mg total) by mouth daily.    Dispense:  90 tablet    Refill:  3   nitroGLYCERIN (NITROSTAT) 0.4 MG SL tablet    Sig: Place 1 tablet (0.4 mg total) under the tongue every 5 (five) minutes as needed for chest pain.    Dispense:  25 tablet    Refill:  3   metoprolol succinate (TOPROL XL) 25 MG 24 hr tablet    Sig: Take 0.5 tablets (12.5 mg total) by mouth at bedtime.    Dispense:  45 tablet    Refill:  3    Stop Coreg    Current medicines are reviewed at length with the patient today.  The patient does not have concerns regarding medicines.  I spent 32 minutes reviewing all clinical data during and prior to this visit including all relevant imaging studies, laboratories, clinical information from other health systems, and prior notes from both Cardiology and other specialties, interviewing the patient, and conducting a complete physical examination in order to formulate a comprehensive and personalized evaluation and treatment plan.  History of Present Illness:      FOCUSED PROBLEM LIST:   Coronary artery disease Inferior ST elevation myocardial infarction status post PCI RCA 2002 CABG status post LIMA to LAD and vein graft to obtuse marginal 2008 Hyperlipidemia Intolerant of atorvastatin, rosuvastatin, and simvastatin due to myalgias and arthralgias Hypertension BMI 30 Aortic atherosclerosis CT 2019 Obstructive sleep apnea On CPAP Prediabetes Diverticulitis 2023 C/b abscess  requiring ex-lap Colostomy in place  October 2024: The patient is seen today for routine cardiology follow-up.  He was last seen in 2021 and was doing fairly well at that time without cardiovascular complaints.  He had a lapse in cardiology follow-up due to the development of diverticulitis requiring emergent ex lap and extended hospitalization.  After he recovered from this he did take care of  his wife who had her own medical issues.  The patient is doing well today.  He denies any significant cardiovascular complaints including chest pain, shortness of breath, presyncope or syncope.  He does tell me that since starting rosuvastatin he has developed arthralgias.  These occur when he wakes up in the morning and then seems to remit over several hours.  They happen on a daily basis.  He fortunately has not required any emergency room visits or hospitalizations.  He does have his colostomy bag in place and has no immediate plans for it to be reversed given his advanced age and the fact that would require a large surgery and he is not interested in this right now.        Current Medications: Current Meds  Medication Sig   acetaminophen (TYLENOL) 500 MG tablet Take 1,000 mg by mouth every 6 (six) hours as needed for moderate pain or headache.   aspirin EC 81 MG tablet Take 1 tablet (81 mg total) by mouth daily. (Patient taking differently: Take 81 mg by mouth every evening.)   doxycycline (VIBRAMYCIN) 100 MG capsule Take 1 capsule (100 mg total) by mouth 2 (two) times daily.   HYDROcodone-acetaminophen (NORCO/VICODIN) 5-325 MG per tablet Take 1 tablet by mouth every 6 (six) hours as needed for moderate pain.   metoprolol succinate (TOPROL XL) 25 MG 24 hr tablet Take 0.5 tablets (12.5 mg total) by mouth at bedtime.   Multiple Vitamins-Minerals (MULTIVITAMIN GUMMIES MENS) CHEW Chew 1 tablet by mouth every evening.   nitroGLYCERIN (NITROSTAT) 0.4 MG SL tablet Place 1 tablet (0.4 mg total) under the tongue every 5 (five) minutes as needed for chest pain.   omeprazole (PRILOSEC) 20 MG capsule Take 20 mg by mouth every evening.   telmisartan (MICARDIS) 20 MG tablet Take 1 tablet (20 mg total) by mouth daily.   [DISCONTINUED] carvedilol (COREG) 3.125 MG tablet Take 1 tablet (3.125 mg total) by mouth 2 (two) times daily with a meal.   [DISCONTINUED] rosuvastatin (CRESTOR) 40 MG tablet Take 40 mg by  mouth every evening.     Review of Systems:   Please see the history of present illness.    All other systems reviewed and are negative.     EKGs/Labs/Other Test Reviewed:   EKG: EKG from April 2023 demonstrates sinus rhythm with PACs, and inferolateral and anterior nonspecific ST changes  EKG Interpretation Date/Time:  Wednesday May 09 2023 14:18:49 EDT Ventricular Rate:  67 PR Interval:  204 QRS Duration:  86 QT Interval:  406 QTC Calculation: 429 R Axis:   74  Text Interpretation: Normal sinus rhythm ST & T wave abnormality, consider inferior ischemia When compared with ECG of 27-Oct-2021 07:11, Premature atrial complexes are no longer Present Vent. rate has decreased BY  33 BPM T wave inversion less evident in Inferior leads T wave inversion no longer evident in Anterior leads Confirmed by Alverda Skeans ([id number]) on 05/09/2023 2:29:19 PM         Risk Assessment/Calculations:          Physical Exam:   VS:  BP Marland Kitchen)  140/70   Pulse 73   Ht 5\' 11"  (1.803 m)   Wt 194 lb 6.4 oz (88.2 kg)   SpO2 95%   BMI 27.11 kg/m    HYPERTENSION CONTROL Vitals:   05/09/23 1423 05/09/23 1447  BP: (!) 154/86 (!) 140/70    The patient's blood pressure is elevated above target today.  In order to address the patient's elevated BP: A new medication was prescribed today.      Wt Readings from Last 3 Encounters:  05/09/23 194 lb 6.4 oz (88.2 kg)  10/23/21 182 lb 5.1 oz (82.7 kg)  03/30/21 202 lb (91.6 kg)      GENERAL:  No apparent distress, AOx3 HEENT:  No carotid bruits, +2 carotid impulses, no scleral icterus CAR: RRR no murmurs, gallops, rubs, or thrills RES:  Clear to auscultation bilaterally ABD:  Soft, nontender, nondistended, positive bowel sounds; colostomy bag in place VASC:  +2 radial pulses, +2 carotid pulses NEURO:  CN 2-12 grossly intact; motor and sensory grossly intact PSYCH:  No active depression or anxiety EXT:  No edema, ecchymosis, or  cyanosis  Signed, Orbie Pyo, MD  05/09/2023 3:12 PM    [facility] [address] [place], [place], [place]  [address] Phone: [phone]; Fax: [phone]   Note:  This document was prepared using Dragon voice recognition software and may include unintentional dictation errors.

## 2023-05-09 ENCOUNTER — Encounter: Payer: Self-pay | Admitting: Internal Medicine

## 2023-05-09 ENCOUNTER — Ambulatory Visit: Payer: Medicare Other | Attending: Internal Medicine | Admitting: Internal Medicine

## 2023-05-09 VITALS — BP 140/70 | HR 73 | Ht 71.0 in | Wt 194.4 lb

## 2023-05-09 DIAGNOSIS — I700 Atherosclerosis of aorta: Secondary | ICD-10-CM | POA: Diagnosis not present

## 2023-05-09 DIAGNOSIS — E785 Hyperlipidemia, unspecified: Secondary | ICD-10-CM | POA: Diagnosis not present

## 2023-05-09 DIAGNOSIS — Z6830 Body mass index (BMI) 30.0-30.9, adult: Secondary | ICD-10-CM

## 2023-05-09 DIAGNOSIS — R7303 Prediabetes: Secondary | ICD-10-CM

## 2023-05-09 DIAGNOSIS — I25118 Atherosclerotic heart disease of native coronary artery with other forms of angina pectoris: Secondary | ICD-10-CM | POA: Diagnosis not present

## 2023-05-09 DIAGNOSIS — I10 Essential (primary) hypertension: Secondary | ICD-10-CM | POA: Diagnosis not present

## 2023-05-09 DIAGNOSIS — G4733 Obstructive sleep apnea (adult) (pediatric): Secondary | ICD-10-CM

## 2023-05-09 MED ORDER — NITROGLYCERIN 0.4 MG SL SUBL: 0.4000 mg | SUBLINGUAL_TABLET | SUBLINGUAL | 3 refills | Status: AC | PRN

## 2023-05-09 MED ORDER — TELMISARTAN 20 MG PO TABS: 20.0000 mg | ORAL_TABLET | Freq: Every day | ORAL | 3 refills | Status: AC

## 2023-05-09 MED ORDER — METOPROLOL SUCCINATE ER 25 MG PO TB24: 12.5000 mg | ORAL_TABLET | Freq: Every day | ORAL | 3 refills | Status: AC

## 2023-05-09 NOTE — Patient Instructions (Addendum)
Medication Instructions:  Your physician has recommended you make the following change in your medication:  1.) stop carvedilol (Coreg) 2.) stop rosuvastatin (Crestor) 3.) start metoprolol succinate (Toprol XL) 25 mg - take HALF TABLET DAILY AT BEDTIME 4.) start micardis 20 mg - take one tablet daily 5.) start nitroglycerin 0.4 mg - one tablet under the tongue for chest pain - repeat once every 5 min as needed for a total of 3 doses per episode of chest pain.  If the third dose is needed - call 911 when you take it.  *If you need a refill on your cardiac medications before your next appointment, please call your pharmacy*   Lab Work: Please return in 7-10 days for blood work (bmet, lipids, liver)  If you have labs (blood work) drawn today and your tests are completely normal, you will receive your results only by: MyChart Message (if you have MyChart) OR A paper copy in the mail If you have any lab test that is abnormal or we need to change your treatment, we will call you to review the results.   Testing/Procedures: Your physician has requested that you have an echocardiogram. Echocardiography is a painless test that uses sound waves to create images of your heart. It provides your doctor with information about the size and shape of your heart and how well your heart's chambers and valves are working. This procedure takes approximately one hour. There are no restrictions for this procedure. Please do NOT wear cologne, perfume, aftershave, or lotions (deodorant is allowed). Please arrive 15 minutes prior to your appointment time.   Follow-Up: At [facility], you and your health needs are our priority.  As part of our continuing mission to provide you with exceptional heart care, we have created designated Provider Care Teams.  These Care Teams include your primary Cardiologist (physician) and Advanced Practice Providers (APPs -  Physician Assistants and Nurse Practitioners) who  all work together to provide you with the care you need, when you need it.  We recommend signing up for the patient portal called "MyChart".  Sign up information is provided on this After Visit Summary.  MyChart is used to connect with patients for Virtual Visits (Telemedicine).  Patients are able to view lab/test results, encounter notes, upcoming appointments, etc.  Non-urgent messages can be sent to your provider as well.   To learn more about what you can do with MyChart, go to [email or web address].    Your next appointment:   6 month(s)  Provider:   Advanced Practice Practitioner (NP or PA-C)  Other Instructions You have been referred to our Clinical Pharmacy Team for lipids managment

## 2023-05-21 ENCOUNTER — Ambulatory Visit: Payer: Medicare Other | Attending: Cardiology | Admitting: Pharmacist

## 2023-05-21 DIAGNOSIS — E785 Hyperlipidemia, unspecified: Secondary | ICD-10-CM

## 2023-05-21 DIAGNOSIS — I10 Essential (primary) hypertension: Secondary | ICD-10-CM | POA: Diagnosis not present

## 2023-05-21 LAB — BASIC METABOLIC PANEL
BUN/Creatinine Ratio: 12 (ref 10–24)
BUN: 10 mg/dL (ref 8–27)
CO2: 28 mmol/L (ref 20–29)
Calcium: 10.4 mg/dL — ABNORMAL HIGH (ref 8.6–10.2)
Chloride: 100 mmol/L (ref 96–106)
Creatinine, Ser: 0.83 mg/dL (ref 0.76–1.27)
Glucose: 122 mg/dL — ABNORMAL HIGH (ref 70–99)
Potassium: 5 mmol/L (ref 3.5–5.2)
Sodium: 140 mmol/L (ref 134–144)
eGFR: 90 mL/min/{1.73_m2} (ref >59)

## 2023-05-21 LAB — LIPID PANEL
Chol/HDL Ratio: 6.6 ratio — ABNORMAL HIGH (ref 0.0–5.0)
Cholesterol, Total: 225 mg/dL — ABNORMAL HIGH (ref 100–199)
HDL: 34 mg/dL — ABNORMAL LOW (ref >39)
LDL Chol Calc (NIH): 147 mg/dL — ABNORMAL HIGH (ref 0–99)
Triglycerides: 242 mg/dL — ABNORMAL HIGH (ref 0–149)
VLDL Cholesterol Cal: 44 mg/dL — ABNORMAL HIGH (ref 5–40)

## 2023-05-21 LAB — HEPATIC FUNCTION PANEL
ALT: 42 [IU]/L (ref 0–44)
AST: 39 [IU]/L (ref 0–40)
Albumin: 4.3 g/dL (ref 3.8–4.8)
Alkaline Phosphatase: 84 [IU]/L (ref 44–121)
Bilirubin Total: 0.5 mg/dL (ref 0.0–1.2)
Bilirubin, Direct: 0.18 mg/dL (ref 0.00–0.40)
Total Protein: 7 g/dL (ref 6.0–8.5)

## 2023-05-21 NOTE — Progress Notes (Unsigned)
Patient ID: Robert Barrett                 DOB: Aug 10, 1945                    MRN: [id number]      HPI: Robert Barrett is a 77 y.o. male patient referred to lipid clinic by Dr. Doneta Public. PMH is significant for HLD, HTN, stable angina, aortic atherosclerosis, OSA, STEMI in 2002, CABG in 2008, and T2DM. Patient's PCP is at a [facility].   Patient was last seen by Dr. Doneta Public on 05/09/23 where he was referred to the lipid clinic. He has a hx of intolerance to rosuvastatin, atorvastatin, and simvastatin due to myalgias. Patient states he is not sure if he is taking his fenofibrate or not. He sees his PCP at a [facility] so dispense hx in chart is not 100% correct. Last lipid panel was on 08/30/22 and was notable for his TG 235, LDL-C 60, and HDL 44. At that time, patient was above LDL-C goal of <55 at 60 while on rosuvastatin 40mg . Patient was taken off rosuvastatin at last visit on 05/09/23 due to myalgias. Discussed patients diet and recommended reducing saturated fats and processed meats such as ice cream and deli meat. Encouraged patient to continue exercising and increasing HR while doing so. He has not tried zetimibe, PCSK-9 inhibitors, bempedoic acid or inclisiran. Reviewed Repatha for lowering LDL cholesterol including mechanism of action, dosing, side effects and potential decreases in LDL-C. Also reviewed cost information. Patient wishes to get Repatha from the [place] because we believe that will be the best cost.   Current Medications: Fenofibrate 160mg  daily Intolerances: rosuvastatin, atorvastatin, simvastatin (myalgias) Risk Factors: age >53 yo, T2DM, STEMI (2002), CABG (2008) LDL-C goal: <55 ApoB goal:   Diet: typically 2 meals/d. Wife has recently undergone surgery so she is not cooking as much as she used too, but increasing, which leads them to eat out more often  Breakfast: eggs, sausage or bacon a few times a month Lunch/Dinner: chicken, hamburger steak. Will sometimes  eat a sandwich if hungry around lunch time. Usually paired with corn, green beans, potatoes, broccoli, or pinto beans. Patient will sometimes eat salads with french dressing.  Snacks: ice cream with wife at night a few days a week.   Exercise: walks with wife at a local gym and uses the elliptical   Family History: Father (deceased) heart disease, mother (deceased) breast cancer  Social History:  Social History   Socioeconomic History   Marital status: Married    Spouse name: Not on file   Number of children: 3   Years of education: Not on file   Highest education level: Not on file  Occupational History   Not on file  Tobacco Use   Smoking status: Never   Smokeless tobacco: Never  Substance and Sexual Activity   Alcohol use: Yes   Drug use: No   Sexual activity: Not on file  Other Topics Concern   Not on file  Social History Narrative   Not on file   Social Determinants of Health   Financial Resource Strain: Low Risk  (03/14/2023)   Received from [facility]   Overall Financial Resource Strain (CARDIA)    Difficulty of Paying Living Expenses: Not hard at all  Food Insecurity: No Food Insecurity (03/14/2023)   Received from [facility]   Hunger Vital Sign    Worried About Running Out of Food  in the Last Year: Never true    Ran Out of Food in the Last Year: Never true  Transportation Needs: No Transportation Needs (03/14/2023)   Received from [facility] - Transportation    Lack of Transportation (Medical): No    Lack of Transportation (Non-Medical): No  Physical Activity: Sufficiently Active (03/14/2023)   Received from [facility]   Exercise Vital Sign    Days of Exercise per Week: 7 days    Minutes of Exercise per Session: 60 min  Stress: No Stress Concern Present (03/14/2023)   Received from [facility] - Occupational Stress Questionnaire    Feeling of Stress : Not at all  Social Connections: Socially  Integrated (03/14/2023)   Received from [facility]   Social Network    How would you rate your social network (family, work, friends)?: Good participation with social networks  Intimate Partner Violence: Not At Risk (03/14/2023)   Received from [facility]   HITS    Over the last 12 months how often did your partner physically hurt you?: Never    Over the last 12 months how often did your partner insult you or talk down to you?: Never    Over the last 12 months how often did your partner threaten you with physical harm?: Never    Over the last 12 months how often did your partner scream or curse at you?: Never     Labs: Lipid Panel (08/30/22): TC 142, TG 235, HDL 44, LDL 60 (on rosuvastatin 40mg )   Past Medical History:  Diagnosis Date   Anxiety    CAD (coronary artery disease)    GERD (gastroesophageal reflux disease)    HLD (hyperlipidemia)    Hypertension    Insomnia    Sleep apnea    Type 2 diabetes mellitus (HCC)     Current Outpatient Medications on File Prior to Visit  Medication Sig Dispense Refill   acetaminophen (TYLENOL) 500 MG tablet Take 1,000 mg by mouth every 6 (six) hours as needed for moderate pain or headache.     aspirin EC 81 MG tablet Take 1 tablet (81 mg total) by mouth daily. (Patient taking differently: Take 81 mg by mouth every evening.) 90 tablet 3   doxycycline (VIBRAMYCIN) 100 MG capsule Take 1 capsule (100 mg total) by mouth 2 (two) times daily. 14 capsule 0   fenofibrate 160 MG tablet TAKE 1 TABLET(160 MG) BY MOUTH DAILY (Patient not taking: Reported on 05/09/2023) 15 tablet 0   HYDROcodone-acetaminophen (NORCO/VICODIN) 5-325 MG per tablet Take 1 tablet by mouth every 6 (six) hours as needed for moderate pain.     metoprolol succinate (TOPROL XL) 25 MG 24 hr tablet Take 0.5 tablets (12.5 mg total) by mouth at bedtime. 45 tablet 3   Multiple Vitamins-Minerals (MULTIVITAMIN GUMMIES MENS) CHEW Chew 1 tablet by mouth every evening.     nitroGLYCERIN  (NITROSTAT) 0.4 MG SL tablet Place 1 tablet (0.4 mg total) under the tongue every 5 (five) minutes as needed for chest pain. 25 tablet 3   omeprazole (PRILOSEC) 20 MG capsule Take 20 mg by mouth every evening.     oxyCODONE (OXY IR/ROXICODONE) 5 MG immediate release tablet Take 0.5-1 tablets (2.5-5 mg total) by mouth every 6 (six) hours as needed for severe pain. (Patient not taking: Reported on 05/09/2023) 20 tablet 0   telmisartan (MICARDIS) 20 MG tablet Take 1 tablet (20 mg total) by mouth daily.  90 tablet 3   temazepam (RESTORIL) 15 MG capsule Take 15-30 mg by mouth at bedtime. (Patient not taking: Reported on 05/09/2023)     No current facility-administered medications on file prior to visit.    Allergies  Allergen Reactions   Atorvastatin Other (See Comments)    Other Reaction(s): Other (See Comments)   Simvastatin Other (See Comments)    Other Reaction(s): Muscle pain, Joint pain   Crestor [Rosuvastatin] Other (See Comments)    Muscle pain    Assessment/Plan:  Hyperlipidemia Assessment: Patient has intolerances to 3 statins due to myalgia and was taken off rosuvastatin at last visit (05/09/23) Currently on fenofibrate 160mg  daily  Patients last lipid panel was in 08/2022 notable for TG 235, LDL-C 60, and HDL 44 while taking rosuvastatin  Discussed how diet and exercise can impact TG Recommended Repatha to patient given previous lipid panel in feb 2024.  Discussed how to use Repatha, cost, side effects, the potiental decreases in LDL-C, and the risk reduction of having another heart attack Patient wishes to get Repatha from the [place]  Plan: Lipid panel today Fax lipid panel and today's note to PCP at the [facility] Patient is to call the office when he gets home so I can update his current medications.  If patient is taking fenofibrate, continue taking.    Thank you,  Minette Brine, Student Pharmacist  Olene Floss, Pharm.D, BCACP, BCPS, CPP [facility] A  Division of [facility] [address] [place], [place] [address]  Phone: [phone]; Fax: [phone]

## 2023-05-21 NOTE — Patient Instructions (Addendum)
It was great to see you today!  We recommend starting Repatha (evolocumab) 140 mg subcutaneous injection once every 14 days.   Please call us at [phone] with the fax number to the [facility] and your current medication list.

## 2023-05-21 NOTE — Assessment & Plan Note (Signed)
Assessment: Patient has intolerances to 3 statins due to myalgia and was taken off rosuvastatin at last visit (05/09/23) Currently on fenofibrate 160mg  daily  Patients last lipid panel was in 08/2022 notable for TG 235, LDL-C 60, and HDL 44 while taking rosuvastatin  Discussed how diet and exercise can impact TG Recommended Repatha to patient given previous lipid panel in feb 2024.  Discussed how to use Repatha, cost, side effects, the potiental decreases in LDL-C, and the risk reduction of having another heart attack Patient wishes to get Repatha from the [place]  Plan: Lipid panel today Fax lipid panel and today's note to PCP at the [facility] Patient is to call the office when he gets home so I can update his current medications.  If patient is taking fenofibrate, continue taking.

## 2023-05-24 ENCOUNTER — Other Ambulatory Visit: Payer: Self-pay | Admitting: *Deleted

## 2023-05-24 DIAGNOSIS — I10 Essential (primary) hypertension: Secondary | ICD-10-CM

## 2023-05-24 NOTE — Progress Notes (Signed)
Bmet repeat order for in one week per Dr. Lynnette Caffey due to potassium 5.0

## 2023-05-28 ENCOUNTER — Telehealth: Payer: Self-pay

## 2023-05-28 NOTE — Telephone Encounter (Signed)
Called patient to confirm fenofibrate use. Patient stated he is not currently taking fenofibrate.  Also obtained fax number for his PCP at the [place] to send them the recent lipid panel and Repatha recommendation.   Fax: [phone] Phone: [phone]

## 2023-05-28 NOTE — Telephone Encounter (Signed)
Office notes and labs faxed to [place]

## 2023-06-02 LAB — BASIC METABOLIC PANEL
BUN/Creatinine Ratio: 12 (ref 10–24)
BUN: 10 mg/dL (ref 8–27)
CO2: 25 mmol/L (ref 20–29)
Calcium: 10.5 mg/dL — ABNORMAL HIGH (ref 8.6–10.2)
Chloride: 102 mmol/L (ref 96–106)
Creatinine, Ser: 0.83 mg/dL (ref 0.76–1.27)
Glucose: 116 mg/dL — ABNORMAL HIGH (ref 70–99)
Potassium: 5.2 mmol/L (ref 3.5–5.2)
Sodium: 142 mmol/L (ref 134–144)
eGFR: 90 mL/min/{1.73_m2} (ref >59)

## 2023-06-19 ENCOUNTER — Ambulatory Visit (HOSPITAL_COMMUNITY): Payer: Medicare Other | Attending: Internal Medicine

## 2023-06-19 DIAGNOSIS — I25118 Atherosclerotic heart disease of native coronary artery with other forms of angina pectoris: Secondary | ICD-10-CM | POA: Diagnosis present

## 2023-06-19 LAB — ECHOCARDIOGRAM COMPLETE
AR max vel: 3.36 cm2
AV Area VTI: 3.18 cm2
AV Area mean vel: 3.17 cm2
AV Mean grad: 2.5 mm[Hg]
AV Peak grad: 4.9 mm[Hg]
Ao pk vel: 1.11 m/s
Area-P 1/2: 3.95 cm2
S' Lateral: 2.8 cm

## 2023-06-21 NOTE — Addendum Note (Signed)
Addended by: Malena Peer D on: 06/21/2023 03:13 PM   Modules accepted: Orders

## 2023-06-21 NOTE — Telephone Encounter (Signed)
Patient dropped off updated med list. Med list updated.

## 2023-08-02 ENCOUNTER — Telehealth: Payer: Self-pay | Admitting: Pharmacist

## 2023-08-02 NOTE — Telephone Encounter (Signed)
I spoke with patient.  He was not able to get PCSK9 through the [place].  Patient said they do not carry it, however, I saw a note in his chart that they wanted him to try Zetia first.  He does have an upcoming appointment with his PCP at the end of this month.  I discussed that we could use his Medicare part D benefit and get Repatha that way and use a grant to cover the cost.  Patient thought he was taking a cholesterol medication.  When he saw [place] last we thought he was on fenofibrate which is not good for cardiovascular risk reduction.  However, when he brought in an updated medication list to me after his appointment fenofibrate was not on that list nor was any other cholesterol medication. Patient would like to discuss with his PCP first.

## 2023-08-05 ENCOUNTER — Other Ambulatory Visit: Payer: Self-pay

## 2023-08-05 ENCOUNTER — Ambulatory Visit
Admission: EM | Admit: 2023-08-05 | Discharge: 2023-08-05 | Disposition: A | Discharge status: Home / Self Care | Payer: Non-veteran care | Attending: Physician Assistant | Admitting: Physician Assistant

## 2023-08-05 ENCOUNTER — Encounter: Payer: Self-pay | Admitting: Emergency Medicine

## 2023-08-05 ENCOUNTER — Telehealth: Payer: Self-pay | Admitting: Emergency Medicine

## 2023-08-05 ENCOUNTER — Ambulatory Visit (INDEPENDENT_AMBULATORY_CARE_PROVIDER_SITE_OTHER): Payer: No Typology Code available for payment source

## 2023-08-05 DIAGNOSIS — J329 Chronic sinusitis, unspecified: Secondary | ICD-10-CM

## 2023-08-05 DIAGNOSIS — R051 Acute cough: Secondary | ICD-10-CM

## 2023-08-05 DIAGNOSIS — J40 Bronchitis, not specified as acute or chronic: Secondary | ICD-10-CM

## 2023-08-05 DIAGNOSIS — H66001 Acute suppurative otitis media without spontaneous rupture of ear drum, right ear: Secondary | ICD-10-CM

## 2023-08-05 LAB — POC COVID19/FLU A&B COMBO
Covid Antigen, POC: NEGATIVE
Influenza A Antigen, POC: NEGATIVE
Influenza B Antigen, POC: NEGATIVE

## 2023-08-05 MED ORDER — AMOXICILLIN-POT CLAVULANATE 875-125 MG PO TABS: 1.0000 | ORAL_TABLET | Freq: Two times a day (BID) | ORAL | 0 refills | Status: DC

## 2023-08-05 MED ORDER — PREDNISONE 10 MG PO TABS: 30.0000 mg | ORAL_TABLET | Freq: Every day | ORAL | 0 refills | Status: AC

## 2023-08-05 MED ORDER — BENZONATATE 100 MG PO CAPS: 100.0000 mg | ORAL_CAPSULE | Freq: Three times a day (TID) | ORAL | 0 refills | Status: AC

## 2023-08-05 NOTE — Telephone Encounter (Signed)
Per provider, "There is no evidence of pneumonia on his chest x-ray.  They did see some round areas in his left lung that they think are something on his skin or clothing but I do recommend you follow-up with his primary care within a few weeks to have a repeat chest x-ray. " Pt aware and verbalized understanding.

## 2023-08-05 NOTE — ED Triage Notes (Addendum)
Pt reports cough, bilateral ear pain, intermittent fever x7-8 days. Reports diarrhea, generalized body aches started x3 days ago.  Has tried coricidin cold and flu x2 bottles but reports symptoms remain.

## 2023-08-05 NOTE — ED Provider Notes (Signed)
[facility]    CSN: [id number] Arrival date & time: 08/05/23  1012      History   Chief Complaint Chief Complaint  Patient presents with   Cough    HPI Robert Barrett is a 78 y.o. male.   Patient presents today with a 7 to 8-day history of URI symptoms.  Reports congestion, diarrhea, fatigue/malaise, cough.  Denies any chest pain, shortness of breath, nausea, vomiting.  He has taken multiple over-the-counter medications including cold and flu medicine without improvement of symptoms.  Denies any known sick contacts.  He has not had COVID recently.  He denies history of asthma, smoking, COPD.  He is having difficulty with his daily activities including sleeping at night because of the cough.  He denies any recent antibiotics or steroids.    Past Medical History:  Diagnosis Date   Anxiety    CAD (coronary artery disease)    GERD (gastroesophageal reflux disease)    HLD (hyperlipidemia)    Hypertension    Insomnia    Sleep apnea    Type 2 diabetes mellitus (HCC)     Patient Active Problem List   Diagnosis Date Noted   Hyperlipidemia 05/21/2023   Malnutrition of moderate degree 10/26/2021   Colonic diverticular abscess 10/20/2021   Anxiety disorder 03/01/2021   Coronary atherosclerosis of native coronary artery 03/01/2021   Essential hypertension 03/01/2021   Gastroesophageal reflux disease without esophagitis 03/01/2021   Obstructive sleep apnea 03/01/2021    Past Surgical History:  Procedure Laterality Date   CARDIAC SURGERY     CORONARY ARTERY BYPASS GRAFT     LAPAROTOMY N/A 10/25/2021   Procedure: EXPLORATORY LAPAROTOMY, Luz Brazen Procedure;  Surgeon: Berna Bue, MD;  Location: [facility] OR;  Service: General;  Laterality: N/A;       Home Medications    Prior to Admission medications   Medication Sig Start Date End Date Taking? Authorizing Provider  amoxicillin-clavulanate (AUGMENTIN) 875-125 MG tablet Take 1 tablet by mouth every 12 (twelve)  hours. 08/05/23  Yes Osamu Olguin K, PA-C  benzonatate (TESSALON) 100 MG capsule Take 1 capsule (100 mg total) by mouth every 8 (eight) hours. 08/05/23  Yes Teren Zurcher K, PA-C  predniSONE (DELTASONE) 10 MG tablet Take 3 tablets (30 mg total) by mouth daily for 4 days. 08/05/23 08/09/23 Yes Liesl Simons, Noberto Retort, PA-C  acetaminophen (TYLENOL) 500 MG tablet Take 1,000 mg by mouth every 6 (six) hours as needed for moderate pain or headache.    [provider]  amLODipine (NORVASC) 10 MG tablet Take 10 mg by mouth daily.    [provider]  aspirin EC 81 MG tablet Take 1 tablet (81 mg total) by mouth daily. Patient taking differently: Take 81 mg by mouth every evening. 03/24/19   Corky Crafts, MD  cholecalciferol (VITAMIN D3) 25 MCG (1000 UNIT) tablet Take 1,000 Units by mouth daily.    [provider]  magnesium oxide (MAG-OX) 400 (240 Mg) MG tablet Take 400 mg by mouth daily.    [provider]  metoprolol succinate (TOPROL XL) 25 MG 24 hr tablet Take 0.5 tablets (12.5 mg total) by mouth at bedtime. 05/09/23   Orbie Pyo, MD  milk thistle 175 MG tablet Take 1,000 mg by mouth daily.    [provider]  Misc Natural Products ([employer] PROSTATE MAX + PO) Take by mouth.    [provider]  Multiple Vitamins-Minerals (MULTIVITAMIN GUMMIES MENS) CHEW Chew 1 tablet by mouth every  evening.    [provider]  nitroGLYCERIN (NITROSTAT) 0.4 MG SL tablet Place 1 tablet (0.4 mg total) under the tongue every 5 (five) minutes as needed for chest pain. 05/09/23   Orbie Pyo, MD  omeprazole (PRILOSEC) 20 MG capsule Take 20 mg by mouth every evening.    [provider]  telmisartan (MICARDIS) 20 MG tablet Take 1 tablet (20 mg total) by mouth daily. 05/09/23   Orbie Pyo, MD  traZODone (DESYREL) 100 MG tablet Take 100 mg by mouth at bedtime.    [provider]  vitamin B-12 (CYANOCOBALAMIN) 100 MCG tablet Take 100 mcg by mouth  daily.    [provider]    Family History Family History  Problem Relation Age of Onset   Cancer Mother        breast   Heart disease Father     Social History Social History   Tobacco Use   Smoking status: Never   Smokeless tobacco: Never  Substance Use Topics   Alcohol use: Yes   Drug use: No     Allergies   Atorvastatin, Simvastatin, and Crestor [rosuvastatin]   Review of Systems Review of Systems  Constitutional:  Positive for activity change and fatigue. Negative for appetite change and fever.  HENT:  Positive for congestion and postnasal drip. Negative for sinus pressure, sneezing and sore throat.   Respiratory:  Positive for cough. Negative for shortness of breath.   Cardiovascular:  Negative for chest pain.  Gastrointestinal:  Positive for diarrhea. Negative for abdominal pain, nausea and vomiting.  Musculoskeletal:  Positive for arthralgias and myalgias.  Neurological:  Negative for dizziness, light-headedness and headaches.     Physical Exam Triage Vital Signs ED Triage Vitals  Encounter Vitals Group     BP 08/05/23 1131 135/79     Systolic BP Percentile --      Diastolic BP Percentile --      Pulse Rate 08/05/23 1131 78     Resp 08/05/23 1131 20     Temp 08/05/23 1131 98.9 F (37.2 C)     Temp Source 08/05/23 1131 Oral     SpO2 08/05/23 1131 94 %     Weight --      Height --      Head Circumference --      Peak Flow --      Pain Score 08/05/23 1130 5     Pain Loc --      Pain Education --      Exclude from Growth Chart --    No data found.  Updated Vital Signs BP 135/79 (BP Location: Right Arm)   Pulse 78   Temp 98.9 F (37.2 C) (Oral)   Resp 20   SpO2 94%   Visual Acuity Right Eye Distance:   Left Eye Distance:   Bilateral Distance:    Right Eye Near:   Left Eye Near:    Bilateral Near:     Physical Exam Vitals reviewed.  Constitutional:      General: He is awake.     Appearance: Normal appearance. He is  well-developed. He is not ill-appearing.     Comments: Very pleasant male appears stated age in no acute distress sitting comfortably in exam room  HENT:     Head: Normocephalic and atraumatic.     Right Ear: Ear canal and external ear normal. Tympanic membrane is erythematous and retracted. Tympanic membrane is not bulging.     Left Ear:  Tympanic membrane, ear canal and external ear normal. Tympanic membrane is not erythematous or bulging.     Nose: Nose normal. No congestion or rhinorrhea.     Mouth/Throat:     Pharynx: Uvula midline. Posterior oropharyngeal erythema present. No oropharyngeal exudate or uvula swelling.  Cardiovascular:     Rate and Rhythm: Normal rate and regular rhythm.     Heart sounds: Normal heart sounds, S1 normal and S2 normal. No murmur heard. Pulmonary:     Effort: Pulmonary effort is normal. No accessory muscle usage or respiratory distress.     Breath sounds: No stridor. Rhonchi present. No wheezing or rales.     Comments: Widespread rhonchi partially clear with cough Abdominal:     General: Bowel sounds are normal.     Palpations: Abdomen is soft.     Tenderness: There is no abdominal tenderness.  Neurological:     Mental Status: He is alert.  Psychiatric:        Behavior: Behavior is cooperative.      UC Treatments / Results  Labs (all labs ordered are listed, but only abnormal results are displayed) Labs Reviewed  POC COVID19/FLU A&B COMBO    EKG   Radiology No results found.  Procedures Procedures (including critical care time)  Medications Ordered in UC Medications - No data to display  Initial Impression / Assessment and Plan / UC Course  I have reviewed the triage vital signs and the nursing notes.  Pertinent labs & imaging results that were available during my care of the patient were reviewed by me and considered in my medical decision making (see chart for details).     Patient is well-appearing, afebrile, nontoxic,  nontachycardic.  Viral testing was obtained in triage and was negative.  Chest x-ray was obtained and there is no evidence of acute cardiopulmonary disease based on my primary read.  At the time of discharge we were waiting for radiologist over read and we will contact him if we need to adjust our treatment plan based on these results.  Given prolonged and worsening symptoms as well as otitis media identified on physical exam we will start Augmentin twice daily for 7 days.  No indication for dose adjustment based on metabolic panel from 06/01/2023 with creatinine of 0.83 and calculated creatinine clearance of 91.3 mL/min.  He was encouraged to use over-the-counter medications for symptom management.  He was given short course of prednisone 30 mg for 4 days with instructions to take NSAIDs with this medication as well as benzonatate to help with his cough.  Recommended follow-up with his primary care.  Discussed that if anything worsens or changes he should be seen immediately.  Strict return precautions given.  All questions answered to patient satisfaction.  Final Clinical Impressions(s) / UC Diagnoses   Final diagnoses:  Acute cough  Sinobronchitis  Non-recurrent acute suppurative otitis media of right ear without spontaneous rupture of tympanic membrane     Discharge Instructions      You were negative for flu and COVID.  I do not see any evidence of pneumonia on your x-ray but we will contact you if the radiologist sees something and we need to start another antibiotic.  Start Augmentin twice daily for 7 days.  Use Tessalon for cough.  I recommend over-the-counter medications including Mucinex, Flonase, Tylenol.  Start prednisone 30 mg for 4 days.  Do not take NSAIDs with this medication including aspirin, ibuprofen/Advil, naproxen/Aleve.  If your symptoms are not improving within  3 to 5 days please return here or see your primary care.  If anything worsens you have high fever, worsening cough,  shortness of breath, chest pain you need to be seen immediately.     ED Prescriptions     Medication Sig Dispense Auth. Provider   amoxicillin-clavulanate (AUGMENTIN) 875-125 MG tablet Take 1 tablet by mouth every 12 (twelve) hours. 14 tablet Allante Beane K, PA-C   benzonatate (TESSALON) 100 MG capsule Take 1 capsule (100 mg total) by mouth every 8 (eight) hours. 21 capsule Finn Amos K, PA-C   predniSONE (DELTASONE) 10 MG tablet Take 3 tablets (30 mg total) by mouth daily for 4 days. 12 tablet Terrez Ander, Noberto Retort, PA-C      PDMP not reviewed this encounter.   Jeani Hawking, PA-C 08/05/23 1333

## 2023-08-05 NOTE — Discharge Instructions (Addendum)
You were negative for flu and COVID.  I do not see any evidence of pneumonia on your x-ray but we will contact you if the radiologist sees something and we need to start another antibiotic.  Start Augmentin twice daily for 7 days.  Use Tessalon for cough.  I recommend over-the-counter medications including Mucinex, Flonase, Tylenol.  Start prednisone 30 mg for 4 days.  Do not take NSAIDs with this medication including aspirin, ibuprofen/Advil, naproxen/Aleve.  If your symptoms are not improving within 3 to 5 days please return here or see your primary care.  If anything worsens you have high fever, worsening cough, shortness of breath, chest pain you need to be seen immediately.

## 2023-09-21 ENCOUNTER — Ambulatory Visit
Admission: EM | Admit: 2023-09-21 | Discharge: 2023-09-21 | Disposition: A | Discharge status: Home / Self Care | Attending: Family Medicine | Admitting: Family Medicine

## 2023-09-21 DIAGNOSIS — J309 Allergic rhinitis, unspecified: Secondary | ICD-10-CM | POA: Diagnosis not present

## 2023-09-21 DIAGNOSIS — H6991 Unspecified Eustachian tube disorder, right ear: Secondary | ICD-10-CM

## 2023-09-21 MED ORDER — CETIRIZINE HCL 5 MG PO TABS: 5.0000 mg | ORAL_TABLET | Freq: Every day | ORAL | 2 refills | Status: AC

## 2023-09-21 MED ORDER — PROMETHAZINE-DM 6.25-15 MG/5ML PO SYRP: 5.0000 mL | ORAL_SOLUTION | Freq: Four times a day (QID) | ORAL | 0 refills | Status: AC | PRN

## 2023-09-21 MED ORDER — DEXAMETHASONE SODIUM PHOSPHATE 10 MG/ML IJ SOLN
10.0000 mg | Freq: Once | INTRAMUSCULAR | Status: AC
  Administered 2023-09-21: 10 mg via INTRAMUSCULAR

## 2023-09-21 MED ORDER — AZELASTINE HCL 0.1 % NA SOLN: 1.0000 | Freq: Two times a day (BID) | NASAL | 0 refills | Status: AC

## 2023-09-21 NOTE — ED Triage Notes (Signed)
 Patient is here for bilateral ear pain and fullness. Patient stated he was treated for ear infection in Jan.

## 2023-09-21 NOTE — ED Provider Notes (Signed)
 [facility]    CSN: [id number] Arrival date & time: 09/21/23  1221      History   Chief Complaint Chief Complaint  Patient presents with   Ear Pain    HPI Robert Barrett is a 78 y.o. male.   Patient presenting today with several day history of right ear fullness and pressure, nasal congestion, cough.  Denies fever, chills, chest pain, shortness of breath, abdominal pain, vomiting, diarrhea.  So far not trying anything over-the-counter for symptoms other than cough drops.     Past Medical History:  Diagnosis Date   Anxiety    CAD (coronary artery disease)    GERD (gastroesophageal reflux disease)    HLD (hyperlipidemia)    Hypertension    Insomnia    Sleep apnea    Type 2 diabetes mellitus (HCC)     Patient Active Problem List   Diagnosis Date Noted   Hyperlipidemia 05/21/2023   Malnutrition of moderate degree 10/26/2021   Colonic diverticular abscess 10/20/2021   Anxiety disorder 03/01/2021   Coronary atherosclerosis of native coronary artery 03/01/2021   Essential hypertension 03/01/2021   Gastroesophageal reflux disease without esophagitis 03/01/2021   Obstructive sleep apnea 03/01/2021    Past Surgical History:  Procedure Laterality Date   CARDIAC SURGERY     CORONARY ARTERY BYPASS GRAFT     LAPAROTOMY N/A 10/25/2021   Procedure: EXPLORATORY LAPAROTOMY, Luz Brazen Procedure;  Surgeon: Berna Bue, MD;  Location: [facility] OR;  Service: General;  Laterality: N/A;       Home Medications    Prior to Admission medications   Medication Sig Start Date End Date Taking? Authorizing Provider  acetaminophen (TYLENOL) 500 MG tablet Take 1,000 mg by mouth every 6 (six) hours as needed for moderate pain or headache.   Yes [provider]  amLODipine (NORVASC) 10 MG tablet Take 10 mg by mouth daily.   Yes [provider]  aspirin EC 81 MG tablet Take 1 tablet (81 mg total) by mouth daily. Patient taking differently: Take 81 mg by mouth  every evening. 03/24/19  Yes Corky Crafts, MD  azelastine (ASTELIN) 0.1 % nasal spray Place 1 spray into both nostrils 2 (two) times daily. Use in each nostril as directed 09/21/23  Yes Particia Nearing, PA-C  cetirizine (ZYRTEC) 5 MG tablet Take 1 tablet (5 mg total) by mouth daily. 09/21/23  Yes Particia Nearing, PA-C  cholecalciferol (VITAMIN D3) 25 MCG (1000 UNIT) tablet Take 1,000 Units by mouth daily.   Yes [provider]  magnesium oxide (MAG-OX) 400 (240 Mg) MG tablet Take 400 mg by mouth daily.   Yes [provider]  metoprolol succinate (TOPROL XL) 25 MG 24 hr tablet Take 0.5 tablets (12.5 mg total) by mouth at bedtime. 05/09/23  Yes Orbie Pyo, MD  Misc Natural Products ([employer] PROSTATE MAX + PO) Take by mouth.   Yes [provider]  Multiple Vitamins-Minerals (MULTIVITAMIN GUMMIES MENS) CHEW Chew 1 tablet by mouth every evening.   Yes [provider]  omeprazole (PRILOSEC) 20 MG capsule Take 20 mg by mouth every evening.   Yes [provider]  promethazine-dextromethorphan (PROMETHAZINE-DM) 6.25-15 MG/5ML syrup Take 5 mLs by mouth 4 (four) times daily as needed. 09/21/23  Yes Particia Nearing, PA-C  telmisartan (MICARDIS) 20 MG tablet Take 1 tablet (20 mg total) by mouth daily. 05/09/23  Yes Orbie Pyo, MD  traZODone (DESYREL) 100 MG tablet Take 100 mg by mouth at  bedtime.   Yes [provider]  vitamin B-12 (CYANOCOBALAMIN) 100 MCG tablet Take 100 mcg by mouth daily.   Yes [provider]  amoxicillin-clavulanate (AUGMENTIN) 875-125 MG tablet Take 1 tablet by mouth every 12 (twelve) hours. 08/05/23   Raspet, Noberto Retort, PA-C  benzonatate (TESSALON) 100 MG capsule Take 1 capsule (100 mg total) by mouth every 8 (eight) hours. 08/05/23   Raspet, Erin K, PA-C  milk thistle 175 MG tablet Take 1,000 mg by mouth daily.    [provider]  nitroGLYCERIN (NITROSTAT) 0.4 MG SL tablet Place 1 tablet  (0.4 mg total) under the tongue every 5 (five) minutes as needed for chest pain. 05/09/23   Orbie Pyo, MD    Family History Family History  Problem Relation Age of Onset   Cancer Mother        breast   Heart disease Father     Social History Social History   Tobacco Use   Smoking status: Never   Smokeless tobacco: Never  Substance Use Topics   Alcohol use: Yes   Drug use: No     Allergies   Atorvastatin, Simvastatin, and Crestor [rosuvastatin]   Review of Systems Review of Systems PER HPI  Physical Exam Triage Vital Signs ED Triage Vitals  Encounter Vitals Group     BP 09/21/23 1327 136/79     Systolic BP Percentile --      Diastolic BP Percentile --      Pulse Rate 09/21/23 1327 95     Resp 09/21/23 1327 16     Temp 09/21/23 1327 97.6 F (36.4 C)     Temp Source 09/21/23 1327 Oral     SpO2 09/21/23 1327 95 %     Weight --      Height --      Head Circumference --      Peak Flow --      Pain Score 09/21/23 1331 5     Pain Loc --      Pain Education --      Exclude from Growth Chart --    No data found.  Updated Vital Signs BP 136/79 (BP Location: Left Arm)   Pulse 95   Temp 97.6 F (36.4 C) (Oral)   Resp 16   SpO2 95%   Visual Acuity Right Eye Distance:   Left Eye Distance:   Bilateral Distance:    Right Eye Near:   Left Eye Near:    Bilateral Near:     Physical Exam Vitals and nursing note reviewed.  Constitutional:      Appearance: He is well-developed.  HENT:     Head: Atraumatic.     Right Ear: External ear normal.     Left Ear: External ear normal.     Ears:     Comments: Right middle ear effusion    Nose: Rhinorrhea present.     Mouth/Throat:     Pharynx: Posterior oropharyngeal erythema present. No oropharyngeal exudate.  Eyes:     Conjunctiva/sclera: Conjunctivae normal.     Pupils: Pupils are equal, round, and reactive to light.  Cardiovascular:     Rate and Rhythm: Normal rate and regular rhythm.  Pulmonary:      Effort: Pulmonary effort is normal. No respiratory distress.     Breath sounds: No wheezing or rales.  Musculoskeletal:        General: Normal range of motion.     Cervical back: Normal range of motion and  neck supple.  Lymphadenopathy:     Cervical: No cervical adenopathy.  Skin:    General: Skin is warm and dry.  Neurological:     Mental Status: He is alert and oriented to person, place, and time.  Psychiatric:        Behavior: Behavior normal.    UC Treatments / Results  Labs (all labs ordered are listed, but only abnormal results are displayed) Labs Reviewed - No data to display  EKG   Radiology No results found.  Procedures Procedures (including critical care time)  Medications Ordered in UC Medications  dexamethasone (DECADRON) injection 10 mg (10 mg Intramuscular Given 09/21/23 1350)    Initial Impression / Assessment and Plan / UC Course  I have reviewed the triage vital signs and the nursing notes.  Pertinent labs & imaging results that were available during my care of the patient were reviewed by me and considered in my medical decision making (see chart for details).     Vitals and exam overall reassuring today, suspect allergic sinusitis and right eustachian tube dysfunction.  Treat with IM Decadron, start allergy regimen with Zyrtec, Astelin and Phenergan DM for cough.  Discussed supportive over-the-counter medications, home care and return precautions.  Final Clinical Impressions(s) / UC Diagnoses   Final diagnoses:  Allergic sinusitis  Acute dysfunction of right eustachian tube     Discharge Instructions      We have given you a steroid shot today to help with inflammation and I have prescribed an allergy pill to be taken daily, nasal spray to be taken consistently to help with your sinus and ear issues and some cough medicine.  Follow-up for worsening symptoms.    ED Prescriptions     Medication Sig Dispense Auth. Provider   cetirizine  (ZYRTEC) 5 MG tablet Take 1 tablet (5 mg total) by mouth daily. 30 tablet Particia Nearing, [place]   azelastine (ASTELIN) 0.1 % nasal spray Place 1 spray into both nostrils 2 (two) times daily. Use in each nostril as directed 30 mL Particia Nearing, PA-C   promethazine-dextromethorphan (PROMETHAZINE-DM) 6.25-15 MG/5ML syrup Take 5 mLs by mouth 4 (four) times daily as needed. 100 mL Particia Nearing, [place]      PDMP not reviewed this encounter.   Particia Nearing, [place] 09/21/23 1404

## 2023-09-21 NOTE — Discharge Instructions (Signed)
 We have given you a steroid shot today to help with inflammation and I have prescribed an allergy pill to be taken daily, nasal spray to be taken consistently to help with your sinus and ear issues and some cough medicine.  Follow-up for worsening symptoms.

## 2023-10-04 ENCOUNTER — Encounter: Payer: Self-pay | Admitting: Emergency Medicine

## 2023-10-04 ENCOUNTER — Ambulatory Visit
Admission: EM | Admit: 2023-10-04 | Discharge: 2023-10-04 | Disposition: A | Discharge status: Home / Self Care | Attending: Nurse Practitioner | Admitting: Nurse Practitioner

## 2023-10-04 ENCOUNTER — Other Ambulatory Visit: Payer: Self-pay

## 2023-10-04 DIAGNOSIS — Z87891 Personal history of nicotine dependence: Secondary | ICD-10-CM | POA: Diagnosis not present

## 2023-10-04 DIAGNOSIS — J22 Unspecified acute lower respiratory infection: Secondary | ICD-10-CM

## 2023-10-04 MED ORDER — AZITHROMYCIN 250 MG PO TABS: ORAL_TABLET | ORAL | 0 refills | Status: AC

## 2023-10-04 NOTE — ED Provider Notes (Signed)
 [facility]    CSN: [id number] Arrival date & time: 10/04/23  1104      History   Chief Complaint Chief Complaint  Patient presents with   Cough    HPI Robert Barrett is a 78 y.o. male.   Patient presents today with 8-week history of congested cough.  He was initially seen in urgent care in January, was treated for sinobronchitis with Augmentin, cough suppressant, and prednisone and reports symptoms did not fully improve.  He returned to urgent care a couple of weeks ago and was treated for allergic sinusitis with allergy medicine and nasal spray without improvement.  Reports the mucus is now yellow color instead of green.  He denies any fevers, body aches or chills recently.  No shortness of breath, chest pain or tightness.  No significant runny or stuffy nose, sore throat, headache, ear pain, or abdominal pain.  No change in appetite or change in energy levels.  Patient denies history of chronic lung disease.  Reports he has never had to use an inhaler before.  Reports he quit smoking about 20 years ago, but prior to quitting smoked for 20 to 30 years about 1 pack/day.  Reports he has a primary care provider at the [place] as well as locally and has not seen them for the symptoms.    Past Medical History:  Diagnosis Date   Anxiety    CAD (coronary artery disease)    GERD (gastroesophageal reflux disease)    HLD (hyperlipidemia)    Hypertension    Insomnia    Sleep apnea    Type 2 diabetes mellitus (HCC)     Patient Active Problem List   Diagnosis Date Noted   Hyperlipidemia 05/21/2023   Malnutrition of moderate degree 10/26/2021   Colonic diverticular abscess 10/20/2021   Anxiety disorder 03/01/2021   Coronary atherosclerosis of native coronary artery 03/01/2021   Essential hypertension 03/01/2021   Gastroesophageal reflux disease without esophagitis 03/01/2021   Obstructive sleep apnea 03/01/2021    Past Surgical History:  Procedure Laterality Date    CARDIAC SURGERY     CORONARY ARTERY BYPASS GRAFT     LAPAROTOMY N/A 10/25/2021   Procedure: EXPLORATORY LAPAROTOMY, [place] Procedure;  Surgeon: Berna Bue, MD;  Location: [facility] OR;  Service: General;  Laterality: N/A;       Home Medications    Prior to Admission medications   Medication Sig Start Date End Date Taking? Authorizing Provider  azithromycin (ZITHROMAX) 250 MG tablet Take (2) tablets by mouth on day 1, then take (1) tablet by mouth on days 2-5. 10/04/23  Yes Valentino Nose, NP  acetaminophen (TYLENOL) 500 MG tablet Take 1,000 mg by mouth every 6 (six) hours as needed for moderate pain or headache.    [provider]  amLODipine (NORVASC) 10 MG tablet Take 10 mg by mouth daily.    [provider]  aspirin EC 81 MG tablet Take 1 tablet (81 mg total) by mouth daily. Patient taking differently: Take 81 mg by mouth every evening. 03/24/19   Corky Crafts, MD  azelastine (ASTELIN) 0.1 % nasal spray Place 1 spray into both nostrils 2 (two) times daily. Use in each nostril as directed 09/21/23   Particia Nearing, PA-C  benzonatate (TESSALON) 100 MG capsule Take 1 capsule (100 mg total) by mouth every 8 (eight) hours. 08/05/23   Raspet, Noberto Retort, PA-C  cetirizine (ZYRTEC) 5 MG tablet Take 1 tablet (5 mg total) by mouth daily.  09/21/23   Particia Nearing, PA-C  cholecalciferol (VITAMIN D3) 25 MCG (1000 UNIT) tablet Take 1,000 Units by mouth daily.    [provider]  magnesium oxide (MAG-OX) 400 (240 Mg) MG tablet Take 400 mg by mouth daily.    [provider]  metoprolol succinate (TOPROL XL) 25 MG 24 hr tablet Take 0.5 tablets (12.5 mg total) by mouth at bedtime. 05/09/23   Orbie Pyo, MD  milk thistle 175 MG tablet Take 1,000 mg by mouth daily.    [provider]  Misc Natural Products ([employer] PROSTATE MAX + PO) Take by mouth.    [provider]  Multiple Vitamins-Minerals (MULTIVITAMIN GUMMIES MENS) CHEW Chew  1 tablet by mouth every evening.    [provider]  nitroGLYCERIN (NITROSTAT) 0.4 MG SL tablet Place 1 tablet (0.4 mg total) under the tongue every 5 (five) minutes as needed for chest pain. 05/09/23   Orbie Pyo, MD  omeprazole (PRILOSEC) 20 MG capsule Take 20 mg by mouth every evening.    [provider]  promethazine-dextromethorphan (PROMETHAZINE-DM) 6.25-15 MG/5ML syrup Take 5 mLs by mouth 4 (four) times daily as needed. 09/21/23   Particia Nearing, PA-C  telmisartan (MICARDIS) 20 MG tablet Take 1 tablet (20 mg total) by mouth daily. 05/09/23   Orbie Pyo, MD  traZODone (DESYREL) 100 MG tablet Take 100 mg by mouth at bedtime.    [provider]  vitamin B-12 (CYANOCOBALAMIN) 100 MCG tablet Take 100 mcg by mouth daily.    [provider]    Family History Family History  Problem Relation Age of Onset   Cancer Mother        breast   Heart disease Father     Social History Social History   Tobacco Use   Smoking status: Never   Smokeless tobacco: Never  Substance Use Topics   Alcohol use: Yes   Drug use: No     Allergies   Atorvastatin, Simvastatin, and Crestor [rosuvastatin]   Review of Systems Review of Systems Per HPI  Physical Exam Triage Vital Signs ED Triage Vitals  Encounter Vitals Group     BP 10/04/23 1121 113/74     Systolic BP Percentile --      Diastolic BP Percentile --      Pulse Rate 10/04/23 1121 66     Resp 10/04/23 1121 16     Temp 10/04/23 1121 97.9 F (36.6 C)     Temp Source 10/04/23 1121 Oral     SpO2 10/04/23 1121 95 %     Weight --      Height --      Head Circumference --      Peak Flow --      Pain Score 10/04/23 1125 0     Pain Loc --      Pain Education --      Exclude from Growth Chart --    No data found.  Updated Vital Signs BP 113/74 (BP Location: Right Arm)   Pulse 66   Temp 97.9 F (36.6 C) (Oral)   Resp 16   SpO2 95%   Visual Acuity Right Eye Distance:    Left Eye Distance:   Bilateral Distance:    Right Eye Near:   Left Eye Near:    Bilateral Near:     Physical Exam Vitals and nursing note reviewed.  Constitutional:      General: He is not in acute distress.  Appearance: Normal appearance. He is not ill-appearing or toxic-appearing.  HENT:     Head: Normocephalic and atraumatic.     Right Ear: Tympanic membrane, ear canal and external ear normal.     Left Ear: Tympanic membrane, ear canal and external ear normal.     Nose: Congestion present. No rhinorrhea.     Mouth/Throat:     Mouth: Mucous membranes are moist.     Pharynx: Oropharynx is clear. No oropharyngeal exudate or posterior oropharyngeal erythema.  Eyes:     General: No scleral icterus.    Extraocular Movements: Extraocular movements intact.  Cardiovascular:     Rate and Rhythm: Normal rate and regular rhythm.  Pulmonary:     Effort: Pulmonary effort is normal. No respiratory distress.     Breath sounds: Normal breath sounds. No wheezing, rhonchi or rales.  Musculoskeletal:     Cervical back: Normal range of motion and neck supple.  Lymphadenopathy:     Cervical: No cervical adenopathy.  Skin:    General: Skin is warm and dry.     Coloration: Skin is not jaundiced or pale.     Findings: No erythema or rash.  Neurological:     Mental Status: He is alert and oriented to person, place, and time.  Psychiatric:        Behavior: Behavior is cooperative.      UC Treatments / Results  Labs (all labs ordered are listed, but only abnormal results are displayed) Labs Reviewed - No data to display  EKG   Radiology No results found.  Procedures Procedures (including critical care time)  Medications Ordered in UC Medications - No data to display  Initial Impression / Assessment and Plan / UC Course  I have reviewed the triage vital signs and the nursing notes.  Pertinent labs & imaging results that were available during my care of the patient were  reviewed by me and considered in my medical decision making (see chart for details).   Patient is well-appearing, normotensive, afebrile, not tachycardic, not tachypneic, oxygenating well on room air.    1. Acute lower respiratory infection 2. History of smoking 25-50 pack years Overall, vitals and exam are reassuring today I do suspect some underlying possible chronic bronchitis due to history of smoking, will treat with azithromycin today Continue allergy medicine and over-the-counter guaifenesin, cough suppressant medication Recommended follow-up with primary care provider if symptoms do not fully improve with treatment for further evaluation and management  The patient was given the opportunity to ask questions.  All questions answered to their satisfaction.  The patient is in agreement to this plan.    Final Clinical Impressions(s) / UC Diagnoses   Final diagnoses:  Acute lower respiratory infection  History of smoking 25-50 pack years     Discharge Instructions      We are treating you for a lower respiratory infection with azithromycin.  Please take the medication as prescribed.  You can continue cough suppressant medication and guaifenesin or Mucinex 600 mg twice daily.  Recommend follow-up with your PCP if symptoms do not improve with treatment.     ED Prescriptions     Medication Sig Dispense Auth. Provider   azithromycin (ZITHROMAX) 250 MG tablet Take (2) tablets by mouth on day 1, then take (1) tablet by mouth on days 2-5. 6 tablet Valentino Nose, NP      PDMP not reviewed this encounter.   Valentino Nose, NP 10/04/23 1620

## 2023-10-04 NOTE — Discharge Instructions (Signed)
 We are treating you for a lower respiratory infection with azithromycin.  Please take the medication as prescribed.  You can continue cough suppressant medication and guaifenesin or Mucinex 600 mg twice daily.  Recommend follow-up with your PCP if symptoms do not improve with treatment.

## 2023-10-04 NOTE — ED Triage Notes (Signed)
 Pt reports has been treated for ear infection, bronchitis, and cough related illnesses for awhile now and reports cough remains and won't go away.

## 2024-04-13 ENCOUNTER — Encounter: Payer: Self-pay | Admitting: Emergency Medicine

## 2024-04-13 ENCOUNTER — Ambulatory Visit
Admission: EM | Admit: 2024-04-13 | Discharge: 2024-04-13 | Disposition: A | Discharge status: Home / Self Care | Attending: Family Medicine | Admitting: Family Medicine

## 2024-04-13 ENCOUNTER — Other Ambulatory Visit: Payer: Self-pay

## 2024-04-13 DIAGNOSIS — J069 Acute upper respiratory infection, unspecified: Secondary | ICD-10-CM

## 2024-04-13 DIAGNOSIS — H9201 Otalgia, right ear: Secondary | ICD-10-CM | POA: Diagnosis not present

## 2024-04-13 MED ORDER — AZELASTINE HCL 0.1 % NA SOLN: 1.0000 | Freq: Two times a day (BID) | NASAL | 0 refills | Status: AC

## 2024-04-13 MED ORDER — PROMETHAZINE-DM 6.25-15 MG/5ML PO SYRP: 5.0000 mL | ORAL_SOLUTION | Freq: Four times a day (QID) | ORAL | 0 refills | Status: AC | PRN

## 2024-04-13 NOTE — Discharge Instructions (Signed)
 In addition to the prescribed medications, you may try Coricidin HBP, plain Mucinex, saline sinus rinses, humidifiers, Tylenol  as needed.

## 2024-04-13 NOTE — ED Triage Notes (Addendum)
 Pt reports right ear pain for last few days. Pt reports intermittent cough as well. Pt noted to have bilateral hearing aids present in triage. Reports hx of similar discomfort in right ear. Denies any known injury.

## 2024-04-16 NOTE — ED Provider Notes (Signed)
 [facility]    CSN: [id number] Arrival date & time: 04/13/24  1157      History   Chief Complaint Chief Complaint  Patient presents with   Ear Pain    HPI Robert Barrett is a 78 y.o. male.   Presenting today with several day history of right ear pain, intermittent cough, congestion. Denies CP, SOB, fever, chills, CP, abdominal pain, N/V/D. So far not trying anything OTC for sxs. States had same thing last year this time and was treated with abx for ear infection.     Past Medical History:  Diagnosis Date   Anxiety    CAD (coronary artery disease)    GERD (gastroesophageal reflux disease)    HLD (hyperlipidemia)    Hypertension    Insomnia    Sleep apnea    Type 2 diabetes mellitus (HCC)     Patient Active Problem List   Diagnosis Date Noted   Hyperlipidemia 05/21/2023   Malnutrition of moderate degree 10/26/2021   Colonic diverticular abscess 10/20/2021   Anxiety disorder 03/01/2021   Coronary atherosclerosis of native coronary artery 03/01/2021   Essential hypertension 03/01/2021   Gastroesophageal reflux disease without esophagitis 03/01/2021   Obstructive sleep apnea 03/01/2021    Past Surgical History:  Procedure Laterality Date   CARDIAC SURGERY     CORONARY ARTERY BYPASS GRAFT     LAPAROTOMY N/A 10/25/2021   Procedure: EXPLORATORY LAPAROTOMY, [place] Procedure;  Surgeon: Signe Mitzie LABOR, MD;  Location: [facility] OR;  Service: General;  Laterality: N/A;       Home Medications    Prior to Admission medications   Medication Sig Start Date End Date Taking? Authorizing Provider  azelastine  (ASTELIN ) 0.1 % nasal spray Place 1 spray into both nostrils 2 (two) times daily. Use in each nostril as directed 04/13/24  Yes Stuart Vernell Norris, PA-C  promethazine -dextromethorphan (PROMETHAZINE -DM) 6.25-15 MG/5ML syrup Take 5 mLs by mouth 4 (four) times daily as needed. 04/13/24  Yes Stuart Vernell Norris, PA-C  acetaminophen  (TYLENOL ) 500 MG tablet  Take 1,000 mg by mouth every 6 (six) hours as needed for moderate pain or headache.    [provider]  amLODipine  (NORVASC ) 10 MG tablet Take 10 mg by mouth daily.    [provider]  aspirin  EC 81 MG tablet Take 1 tablet (81 mg total) by mouth daily. Patient taking differently: Take 81 mg by mouth every evening. 03/24/19   Dann Candyce RAMAN, MD  azelastine  (ASTELIN ) 0.1 % nasal spray Place 1 spray into both nostrils 2 (two) times daily. Use in each nostril as directed 09/21/23   Stuart Vernell Norris, PA-C  azithromycin  (ZITHROMAX ) 250 MG tablet Take (2) tablets by mouth on day 1, then take (1) tablet by mouth on days 2-5. 10/04/23   Chandra Harlene LABOR, NP  benzonatate  (TESSALON ) 100 MG capsule Take 1 capsule (100 mg total) by mouth every 8 (eight) hours. 08/05/23   Raspet, Erin K, PA-C  cetirizine  (ZYRTEC ) 5 MG tablet Take 1 tablet (5 mg total) by mouth daily. 09/21/23   Stuart Vernell Norris, PA-C  cholecalciferol (VITAMIN D3) 25 MCG (1000 UNIT) tablet Take 1,000 Units by mouth daily.    [provider]  magnesium  oxide (MAG-OX) 400 (240 Mg) MG tablet Take 400 mg by mouth daily.    [provider]  metoprolol  succinate (TOPROL  XL) 25 MG 24 hr tablet Take 0.5 tablets (12.5 mg total) by mouth at bedtime. 05/09/23   Thukkani, Arun K, MD  milk  thistle 175 MG tablet Take 1,000 mg by mouth daily.    [provider]  Misc Natural Products ([employer] PROSTATE MAX + PO) Take by mouth.    [provider]  Multiple Vitamins-Minerals (MULTIVITAMIN GUMMIES MENS) CHEW Chew 1 tablet by mouth every evening.    [provider]  nitroGLYCERIN  (NITROSTAT ) 0.4 MG SL tablet Place 1 tablet (0.4 mg total) under the tongue every 5 (five) minutes as needed for chest pain. 05/09/23   Thukkani, Arun K, MD  omeprazole (PRILOSEC) 20 MG capsule Take 20 mg by mouth every evening.    [provider]  promethazine -dextromethorphan (PROMETHAZINE -DM) 6.25-15 MG/5ML  syrup Take 5 mLs by mouth 4 (four) times daily as needed. 09/21/23   Stuart Vernell Norris, PA-C  telmisartan  (MICARDIS ) 20 MG tablet Take 1 tablet (20 mg total) by mouth daily. 05/09/23   Thukkani, Arun K, MD  traZODone (DESYREL) 100 MG tablet Take 100 mg by mouth at bedtime.    [provider]  vitamin B-12 (CYANOCOBALAMIN) 100 MCG tablet Take 100 mcg by mouth daily.    [provider]    Family History Family History  Problem Relation Age of Onset   Cancer Mother        breast   Heart disease Father     Social History Social History   Tobacco Use   Smoking status: Never   Smokeless tobacco: Never  Substance Use Topics   Alcohol use: Yes   Drug use: No     Allergies   Atorvastatin, Simvastatin, and Crestor  [rosuvastatin ]   Review of Systems Review of Systems PER HPI  Physical Exam Triage Vital Signs ED Triage Vitals  Encounter Vitals Group     BP 04/13/24 1208 116/76     Girls Systolic BP Percentile --      Girls Diastolic BP Percentile --      Boys Systolic BP Percentile --      Boys Diastolic BP Percentile --      Pulse Rate 04/13/24 1208 74     Resp 04/13/24 1208 20     Temp 04/13/24 1208 (!) 97.4 F (36.3 C)     Temp Source 04/13/24 1208 Oral     SpO2 04/13/24 1208 95 %     Weight --      Height --      Head Circumference --      Peak Flow --      Pain Score 04/13/24 1209 2     Pain Loc --      Pain Education --      Exclude from Growth Chart --    No data found.  Updated Vital Signs BP 116/76 (BP Location: Right Arm)   Pulse 74   Temp (!) 97.4 F (36.3 C) (Oral)   Resp 20   SpO2 95%   Visual Acuity Right Eye Distance:   Left Eye Distance:   Bilateral Distance:    Right Eye Near:   Left Eye Near:    Bilateral Near:     Physical Exam Vitals and nursing note reviewed.  Constitutional:      Appearance: He is well-developed.  HENT:     Head: Atraumatic.     Right Ear: External ear normal.     Left Ear: External  ear normal.     Ears:     Comments: Right middle ear effusion    Nose: Rhinorrhea present.     Mouth/Throat:     Pharynx: No oropharyngeal  exudate.  Eyes:     Conjunctiva/sclera: Conjunctivae normal.     Pupils: Pupils are equal, round, and reactive to light.  Cardiovascular:     Rate and Rhythm: Normal rate and regular rhythm.  Pulmonary:     Effort: Pulmonary effort is normal. No respiratory distress.     Breath sounds: No wheezing or rales.  Musculoskeletal:        General: Normal range of motion.     Cervical back: Normal range of motion and neck supple.  Lymphadenopathy:     Cervical: No cervical adenopathy.  Skin:    General: Skin is warm and dry.  Neurological:     Mental Status: He is alert and oriented to person, place, and time.  Psychiatric:        Behavior: Behavior normal.      UC Treatments / Results  Labs (all labs ordered are listed, but only abnormal results are displayed) Labs Reviewed - No data to display  EKG   Radiology No results found.  Procedures Procedures (including critical care time)  Medications Ordered in UC Medications - No data to display  Initial Impression / Assessment and Plan / UC Course  I have reviewed the triage vital signs and the nursing notes.  Pertinent labs & imaging results that were available during my care of the patient were reviewed by me and considered in my medical decision making (see chart for details).     Vitals and exam reassuring, treat for viral URI with phenergan  dm, astelin , supportive otc medications and home care. Return for worsening sxs.  Final Clinical Impressions(s) / UC Diagnoses   Final diagnoses:  Viral URI with cough  Right ear pain     Discharge Instructions      In addition to the prescribed medications, you may try Coricidin HBP, plain Mucinex, saline sinus rinses, humidifiers, Tylenol  as needed.    ED Prescriptions     Medication Sig Dispense Auth. Provider   azelastine   (ASTELIN ) 0.1 % nasal spray Place 1 spray into both nostrils 2 (two) times daily. Use in each nostril as directed 30 mL Stuart Vernell Norris, PA-C   promethazine -dextromethorphan (PROMETHAZINE -DM) 6.25-15 MG/5ML syrup Take 5 mLs by mouth 4 (four) times daily as needed. 100 mL Stuart Vernell Norris, [place]      PDMP not reviewed this encounter.   Stuart Vernell [place], [place] 04/16/24 [id number]
